# Patient Record
Sex: Female | Born: 1940 | Race: White | Hispanic: No | Smoking: Former smoker
Health system: Southern US, Community
[De-identification: ages and names within clinical notes are randomized; demographics above are authoritative.]

## PROBLEM LIST (undated history)

## (undated) DIAGNOSIS — R001 Bradycardia, unspecified: Secondary | ICD-10-CM

## (undated) DIAGNOSIS — K635 Polyp of colon: Secondary | ICD-10-CM

## (undated) DIAGNOSIS — Z720 Tobacco use: Secondary | ICD-10-CM

## (undated) DIAGNOSIS — I10 Essential (primary) hypertension: Secondary | ICD-10-CM

## (undated) DIAGNOSIS — G259 Extrapyramidal and movement disorder, unspecified: Secondary | ICD-10-CM

## (undated) DIAGNOSIS — J302 Other seasonal allergic rhinitis: Secondary | ICD-10-CM

## (undated) DIAGNOSIS — R251 Tremor, unspecified: Secondary | ICD-10-CM

## (undated) DIAGNOSIS — G629 Polyneuropathy, unspecified: Secondary | ICD-10-CM

## (undated) DIAGNOSIS — A419 Sepsis, unspecified organism: Secondary | ICD-10-CM

## (undated) DIAGNOSIS — N200 Calculus of kidney: Secondary | ICD-10-CM

## (undated) DIAGNOSIS — Z8489 Family history of other specified conditions: Secondary | ICD-10-CM

## (undated) DIAGNOSIS — M810 Age-related osteoporosis without current pathological fracture: Secondary | ICD-10-CM

## (undated) DIAGNOSIS — E7800 Pure hypercholesterolemia, unspecified: Secondary | ICD-10-CM

## (undated) DIAGNOSIS — K219 Gastro-esophageal reflux disease without esophagitis: Secondary | ICD-10-CM

## (undated) HISTORY — PX: COLONOSCOPY: SHX174

## (undated) HISTORY — PX: MOUTH SURGERY: SHX715

## (undated) HISTORY — PX: TONSILLECTOMY: SUR1361

## (undated) HISTORY — PX: OTHER SURGICAL HISTORY: SHX169

## (undated) HISTORY — DX: Polyneuropathy, unspecified: G62.9

## (undated) HISTORY — DX: Other seasonal allergic rhinitis: J30.2

## (undated) HISTORY — PX: APPENDECTOMY: SHX54

## (undated) HISTORY — DX: Tobacco use: Z72.0

## (undated) HISTORY — DX: Pure hypercholesterolemia, unspecified: E78.00

## (undated) HISTORY — DX: Bradycardia, unspecified: R00.1

## (undated) HISTORY — DX: Sepsis, unspecified organism: A41.9

## (undated) HISTORY — DX: Age-related osteoporosis without current pathological fracture: M81.0

## (undated) HISTORY — PX: ABDOMINAL HYSTERECTOMY: SHX81

## (undated) HISTORY — DX: Extrapyramidal and movement disorder, unspecified: G25.9

## (undated) HISTORY — DX: Gastro-esophageal reflux disease without esophagitis: K21.9

## (undated) HISTORY — DX: Polyp of colon: K63.5

---

## 1996-02-24 HISTORY — PX: CHOLECYSTECTOMY: SHX55

## 1998-05-14 ENCOUNTER — Ambulatory Visit (HOSPITAL_BASED_OUTPATIENT_CLINIC_OR_DEPARTMENT_OTHER): Admission: RE | Admit: 1998-05-14 | Discharge: 1998-05-14 | Payer: Self-pay | Admitting: Orthopedic Surgery

## 1998-12-10 ENCOUNTER — Encounter: Payer: Self-pay | Admitting: Obstetrics and Gynecology

## 1998-12-10 ENCOUNTER — Encounter: Admission: RE | Admit: 1998-12-10 | Discharge: 1998-12-10 | Payer: Self-pay | Admitting: Obstetrics and Gynecology

## 1999-01-22 ENCOUNTER — Other Ambulatory Visit: Admission: RE | Admit: 1999-01-22 | Discharge: 1999-01-22 | Payer: Self-pay | Admitting: Obstetrics and Gynecology

## 1999-02-27 ENCOUNTER — Ambulatory Visit (HOSPITAL_COMMUNITY): Admission: RE | Admit: 1999-02-27 | Discharge: 1999-02-27 | Payer: Self-pay | Admitting: Obstetrics and Gynecology

## 1999-12-18 ENCOUNTER — Encounter: Payer: Self-pay | Admitting: Obstetrics and Gynecology

## 1999-12-18 ENCOUNTER — Encounter: Admission: RE | Admit: 1999-12-18 | Discharge: 1999-12-18 | Payer: Self-pay | Admitting: Obstetrics and Gynecology

## 2000-06-11 ENCOUNTER — Other Ambulatory Visit: Admission: RE | Admit: 2000-06-11 | Discharge: 2000-06-11 | Payer: Self-pay | Admitting: Obstetrics and Gynecology

## 2000-06-18 ENCOUNTER — Encounter: Payer: Self-pay | Admitting: Obstetrics and Gynecology

## 2000-06-18 ENCOUNTER — Ambulatory Visit (HOSPITAL_COMMUNITY): Admission: RE | Admit: 2000-06-18 | Discharge: 2000-06-18 | Payer: Self-pay | Admitting: Obstetrics and Gynecology

## 2000-08-16 ENCOUNTER — Ambulatory Visit (HOSPITAL_COMMUNITY): Admission: RE | Admit: 2000-08-16 | Discharge: 2000-08-16 | Payer: Self-pay | Admitting: Obstetrics and Gynecology

## 2000-08-16 ENCOUNTER — Encounter: Payer: Self-pay | Admitting: Obstetrics and Gynecology

## 2000-12-21 ENCOUNTER — Encounter: Payer: Self-pay | Admitting: Obstetrics and Gynecology

## 2000-12-21 ENCOUNTER — Encounter: Admission: RE | Admit: 2000-12-21 | Discharge: 2000-12-21 | Payer: Self-pay | Admitting: Obstetrics and Gynecology

## 2001-01-04 ENCOUNTER — Encounter: Payer: Self-pay | Admitting: Internal Medicine

## 2001-01-04 ENCOUNTER — Encounter: Admission: RE | Admit: 2001-01-04 | Discharge: 2001-01-04 | Payer: Self-pay | Admitting: Internal Medicine

## 2001-02-02 ENCOUNTER — Encounter: Payer: Self-pay | Admitting: Surgery

## 2001-02-07 ENCOUNTER — Ambulatory Visit (HOSPITAL_COMMUNITY): Admission: RE | Admit: 2001-02-07 | Discharge: 2001-02-08 | Payer: Self-pay | Admitting: Surgery

## 2001-02-07 ENCOUNTER — Encounter (INDEPENDENT_AMBULATORY_CARE_PROVIDER_SITE_OTHER): Payer: Self-pay | Admitting: *Deleted

## 2001-02-07 ENCOUNTER — Encounter: Payer: Self-pay | Admitting: Surgery

## 2001-07-15 ENCOUNTER — Other Ambulatory Visit: Admission: RE | Admit: 2001-07-15 | Discharge: 2001-07-15 | Payer: Self-pay | Admitting: Obstetrics and Gynecology

## 2001-12-23 ENCOUNTER — Encounter: Payer: Self-pay | Admitting: Obstetrics and Gynecology

## 2001-12-23 ENCOUNTER — Encounter: Admission: RE | Admit: 2001-12-23 | Discharge: 2001-12-23 | Payer: Self-pay | Admitting: Obstetrics and Gynecology

## 2002-12-19 ENCOUNTER — Ambulatory Visit (HOSPITAL_COMMUNITY): Admission: RE | Admit: 2002-12-19 | Discharge: 2002-12-19 | Payer: Self-pay | Admitting: Gastroenterology

## 2002-12-19 ENCOUNTER — Encounter (INDEPENDENT_AMBULATORY_CARE_PROVIDER_SITE_OTHER): Payer: Self-pay | Admitting: *Deleted

## 2003-01-03 ENCOUNTER — Encounter: Admission: RE | Admit: 2003-01-03 | Discharge: 2003-01-03 | Payer: Self-pay | Admitting: Obstetrics and Gynecology

## 2004-01-23 ENCOUNTER — Encounter: Admission: RE | Admit: 2004-01-23 | Discharge: 2004-01-23 | Payer: Self-pay | Admitting: Obstetrics and Gynecology

## 2005-01-28 ENCOUNTER — Encounter: Admission: RE | Admit: 2005-01-28 | Discharge: 2005-01-28 | Payer: Self-pay | Admitting: Obstetrics and Gynecology

## 2006-02-18 ENCOUNTER — Encounter: Admission: RE | Admit: 2006-02-18 | Discharge: 2006-02-18 | Payer: Self-pay | Admitting: Obstetrics and Gynecology

## 2007-02-18 ENCOUNTER — Emergency Department (HOSPITAL_COMMUNITY): Admission: EM | Admit: 2007-02-18 | Discharge: 2007-02-18 | Payer: Self-pay | Admitting: *Deleted

## 2007-02-21 ENCOUNTER — Encounter: Admission: RE | Admit: 2007-02-21 | Discharge: 2007-02-21 | Payer: Self-pay | Admitting: Obstetrics and Gynecology

## 2008-02-22 ENCOUNTER — Encounter: Admission: RE | Admit: 2008-02-22 | Discharge: 2008-02-22 | Payer: Self-pay | Admitting: Obstetrics and Gynecology

## 2009-03-13 ENCOUNTER — Encounter: Admission: RE | Admit: 2009-03-13 | Discharge: 2009-03-13 | Payer: Self-pay | Admitting: Obstetrics and Gynecology

## 2010-02-06 ENCOUNTER — Emergency Department (HOSPITAL_COMMUNITY)
Admission: EM | Admit: 2010-02-06 | Discharge: 2010-02-07 | Payer: Self-pay | Source: Home / Self Care | Admitting: Emergency Medicine

## 2010-03-14 ENCOUNTER — Encounter
Admission: RE | Admit: 2010-03-14 | Discharge: 2010-03-14 | Payer: Self-pay | Source: Home / Self Care | Attending: Obstetrics and Gynecology | Admitting: Obstetrics and Gynecology

## 2010-03-16 ENCOUNTER — Encounter: Payer: Self-pay | Admitting: Obstetrics and Gynecology

## 2010-07-11 NOTE — Op Note (Signed)
[place]. [facility]  Patient:    Rebecca Flores, Rebecca Flores Visit Number: [id number] MRN: [id number]          Service Type: DSU Location: [facility] [id number] Attending Physician:  Bonnetta Barry Dictated by:   Velora Heckler, M.D. Proc. Date: 02/07/01 Admit Date:  02/07/2001   CC:         Pearla Dubonnet, M.D.  Malachi Pro. Ambrose Mantle, M.D.   Operative Report  PREOPERATIVE DIAGNOSIS:  Chronic cholecystitis, cholelithiasis.  POSTOPERATIVE DIAGNOSIS:  Chronic cholecystitis, cholelithiasis.  PROCEDURE:  Laparoscopic cholecystectomy with intraoperative cholangiography.  SURGEON:  Velora Heckler, M.D.  ASSISTANT:  Vikki Ports, M.D.  ANESTHESIA:  General.  ESTIMATED BLOOD LOSS:  Minimal.  PREPARATION:  Betadine.  COMPLICATIONS:  None.  INDICATIONS:  The patient is a 70 year old white female who presents at the request of Dr. Robley Fries with a 10-year history of cholelithiasis.  The patient experiences intermittent epigastric abdominal pain radiated to the back, associated with nausea.  Episodes last from 30-45 minutes.  Patient has had escalating frequency in her attacks and now averages approximately two episodes per week.  Ultrasound at [facility] on January 04, 2001, showed numerous shadowing gallstones within the gallbladder.  The patient now comes to surgery for cholecystectomy.  DESCRIPTION OF PROCEDURE:  The patient was taken to OR #[id number] at the [facility].  The patient was brought to the operating room and placed in a supine position on the operating room table.  Following administration of general anesthesia, the patient was prepped and draped in the usual strict aseptic fashion.  After ascertaining that an adequate level of anesthesia had been obtained, an infraumbilical incision is made in the midline with a #15 blade.  Dissection is carried down through the subcutaneous tissues.  Fascia is incised in the midline.  The  peritoneal cavity is entered cautiously.  An 0 Vicryl pursestring suture is placed in the fascia.  A Hasson cannula was introduced under direct vision and secured with the pursestring suture.  The abdomen is insufflated with carbon dioxide.  Laparoscope is introduced and the abdomen is explored.  There are adhesions to the patients lower midline surgical wound.  Operating ports are placed in the right upper quadrant, the midline, midclavicular line, and the anterior axillary line. The fundus of the gallbladder is grasped and retracted cephalad.  There are dense omental adhesions to the entire undersurface of the gallbladder.  These are carefully dissected off the gallbladder using the electrocautery for hemostasis.  Dissection is carried down to the neck of the gallbladder, taking care to avoid injury to the duodenum.  The gallbladder is mobilized.  A lymph node is mobilized off of the cystic duct.  The cystic artery was identified, doubly clipped, and divided.  The cystic duct is dissected out along its length.  It is relatively generous in nature.  A clip is placed at the neck of the gallbladder.  The cystic duct is incised, and a Cook cholangiography catheter is introduced into the right upper quadrant and advanced into the cystic duct.  This is secured with a ligaclip.  Using real-time C-arm fluoroscopy, cholangiography is performed.  Hypaque is injected under direct vision with the C-arm.  There is an elongated cystic duct.  There is rapid filling of the main common bile duct and free flow into the duodenum without filling defect or obstruction.  There is slight reflux of contrast into the pancreatic duct.  There is reflux  of contrast into both the right and left ductal systems.  The [facility] catheter is removed, and the clip is extracted from the abdomen.  The posterior branch of the cystic artery is identified, dissected out, doubly clipped, divided.  The cystic duct is then clipped  with four ligaclips and the cystic duct is divided.  The gallbladder is then excised from the gallbladder bed using the hook electrocautery for hemostasis. The gallbladder is extracted using the umbilical port without difficulty.  It contains numerous gallstones.  The 0 Vicryl pursestring suture is tied securely.  Right upper quadrant is irrigated copiously with warm saline, which is evacuated.  Ports are removed under direct vision, and good hemostasis is noted.  Pneumoperitoneum is released.  All port sites are anesthetized with local anesthesia.  All port sites are closed with interrupted 4-0 Vicryl subcuticular sutures.  Benzoin and Steri-Strips are applied.  Sterile gauze dressings are applied.  The patient is awakened from anesthesia and brought to the recovery room in stable condition.  The patient tolerated the procedure well. Dictated by:   Velora Heckler, M.D. Attending Physician:  Bonnetta Barry DD:  02/07/01 TD:  02/07/01 Job: [id number]

## 2010-11-28 LAB — POCT I-STAT CREATININE
Creatinine, Ser: 0.9
Operator id: 294501

## 2010-11-28 LAB — I-STAT 8, (EC8 V) (CONVERTED LAB)
BUN: 19
Bicarbonate: 24.2 — ABNORMAL HIGH
Chloride: 109
Glucose, Bld: 117 — ABNORMAL HIGH
HCT: 40
Hemoglobin: 13.6
Operator id: 294501
Potassium: 4.8
Sodium: 138
TCO2: 25
pCO2, Ven: 37.6 — ABNORMAL LOW
pH, Ven: 7.416 — ABNORMAL HIGH

## 2010-12-13 ENCOUNTER — Emergency Department (HOSPITAL_COMMUNITY)
Admission: EM | Admit: 2010-12-13 | Discharge: 2010-12-13 | Disposition: A | Discharge status: Home / Self Care | Payer: Medicare Other | Attending: Emergency Medicine | Admitting: Emergency Medicine

## 2010-12-13 ENCOUNTER — Emergency Department (HOSPITAL_COMMUNITY): Payer: Medicare Other

## 2010-12-13 DIAGNOSIS — N1330 Unspecified hydronephrosis: Secondary | ICD-10-CM | POA: Insufficient documentation

## 2010-12-13 DIAGNOSIS — E119 Type 2 diabetes mellitus without complications: Secondary | ICD-10-CM | POA: Insufficient documentation

## 2010-12-13 DIAGNOSIS — Z79899 Other long term (current) drug therapy: Secondary | ICD-10-CM | POA: Insufficient documentation

## 2010-12-13 DIAGNOSIS — N201 Calculus of ureter: Secondary | ICD-10-CM | POA: Insufficient documentation

## 2010-12-13 DIAGNOSIS — K219 Gastro-esophageal reflux disease without esophagitis: Secondary | ICD-10-CM | POA: Insufficient documentation

## 2010-12-13 LAB — CBC
HCT: 37.2 % (ref 36.0–46.0)
Hemoglobin: 12.8 g/dL (ref 12.0–15.0)
MCH: 30.2 pg (ref 26.0–34.0)
MCHC: 34.4 g/dL (ref 30.0–36.0)
MCV: 87.7 fL (ref 78.0–100.0)
Platelets: 194 10*3/uL (ref 150–400)
RBC: 4.24 MIL/uL (ref 3.87–5.11)
RDW: 12.5 % (ref 11.5–15.5)
WBC: 9.9 10*3/uL (ref 4.0–10.5)

## 2010-12-13 LAB — DIFFERENTIAL
Basophils Absolute: 0.1 10*3/uL (ref 0.0–0.1)
Basophils Relative: 1 % (ref 0–1)
Eosinophils Absolute: 0.2 10*3/uL (ref 0.0–0.7)
Eosinophils Relative: 2 % (ref 0–5)
Lymphocytes Relative: 26 % (ref 12–46)
Lymphs Abs: 2.6 10*3/uL (ref 0.7–4.0)
Monocytes Absolute: 0.5 10*3/uL (ref 0.1–1.0)
Monocytes Relative: 5 % (ref 3–12)
Neutro Abs: 6.5 10*3/uL (ref 1.7–7.7)
Neutrophils Relative %: 66 % (ref 43–77)

## 2010-12-13 LAB — COMPREHENSIVE METABOLIC PANEL
ALT: 33 U/L (ref 0–35)
AST: 56 U/L — ABNORMAL HIGH (ref 0–37)
Albumin: 3.8 g/dL (ref 3.5–5.2)
Alkaline Phosphatase: 77 U/L (ref 39–117)
BUN: 16 mg/dL (ref 6–23)
CO2: 20 mEq/L (ref 19–32)
Calcium: 9.2 mg/dL (ref 8.4–10.5)
Chloride: 105 mEq/L (ref 96–112)
Creatinine, Ser: 0.7 mg/dL (ref 0.50–1.10)
GFR calc Af Amer: >90 mL/min (ref >90)
GFR calc non Af Amer: 85 mL/min — ABNORMAL LOW (ref >90)
Glucose, Bld: 156 mg/dL — ABNORMAL HIGH (ref 70–99)
Potassium: 3.6 mEq/L (ref 3.5–5.1)
Sodium: 138 mEq/L (ref 135–145)
Total Bilirubin: 0.8 mg/dL (ref 0.3–1.2)
Total Protein: 6.3 g/dL (ref 6.0–8.3)

## 2010-12-13 LAB — URINE MICROSCOPIC-ADD ON

## 2010-12-13 LAB — URINALYSIS, ROUTINE W REFLEX MICROSCOPIC
Bilirubin Urine: NEGATIVE
Glucose, UA: NEGATIVE mg/dL
Ketones, ur: 15 mg/dL — AB
Nitrite: NEGATIVE
Protein, ur: NEGATIVE mg/dL
Specific Gravity, Urine: 1.018 (ref 1.005–1.030)
Urobilinogen, UA: 0.2 mg/dL (ref 0.0–1.0)
pH: 5 (ref 5.0–8.0)

## 2010-12-14 LAB — URINE CULTURE
Colony Count: NO GROWTH
Culture  Setup Time: 201210201747
Culture: NO GROWTH

## 2011-02-09 ENCOUNTER — Other Ambulatory Visit: Payer: Self-pay | Admitting: Obstetrics and Gynecology

## 2011-02-09 DIAGNOSIS — Z1231 Encounter for screening mammogram for malignant neoplasm of breast: Secondary | ICD-10-CM

## 2011-03-16 ENCOUNTER — Ambulatory Visit
Admission: RE | Admit: 2011-03-16 | Discharge: 2011-03-16 | Disposition: A | Discharge status: Home / Self Care | Payer: Medicare Other | Source: Ambulatory Visit | Attending: Obstetrics and Gynecology | Admitting: Obstetrics and Gynecology

## 2011-03-16 DIAGNOSIS — Z1231 Encounter for screening mammogram for malignant neoplasm of breast: Secondary | ICD-10-CM

## 2011-04-24 ENCOUNTER — Emergency Department (HOSPITAL_COMMUNITY): Payer: Medicare Other

## 2011-04-24 ENCOUNTER — Encounter (HOSPITAL_COMMUNITY): Payer: Self-pay | Admitting: Emergency Medicine

## 2011-04-24 ENCOUNTER — Inpatient Hospital Stay (HOSPITAL_COMMUNITY)
Admission: EM | Admit: 2011-04-24 | Discharge: 2011-04-27 | DRG: 690 | Disposition: A | Discharge status: Home / Self Care | Payer: Medicare Other | Attending: Internal Medicine | Admitting: Internal Medicine

## 2011-04-24 ENCOUNTER — Other Ambulatory Visit: Payer: Self-pay

## 2011-04-24 DIAGNOSIS — J449 Chronic obstructive pulmonary disease, unspecified: Secondary | ICD-10-CM | POA: Diagnosis present

## 2011-04-24 DIAGNOSIS — E119 Type 2 diabetes mellitus without complications: Secondary | ICD-10-CM | POA: Diagnosis present

## 2011-04-24 DIAGNOSIS — Y92009 Unspecified place in unspecified non-institutional (private) residence as the place of occurrence of the external cause: Secondary | ICD-10-CM

## 2011-04-24 DIAGNOSIS — R112 Nausea with vomiting, unspecified: Secondary | ICD-10-CM | POA: Diagnosis present

## 2011-04-24 DIAGNOSIS — R748 Abnormal levels of other serum enzymes: Secondary | ICD-10-CM | POA: Diagnosis present

## 2011-04-24 DIAGNOSIS — R55 Syncope and collapse: Secondary | ICD-10-CM

## 2011-04-24 DIAGNOSIS — R1115 Cyclical vomiting syndrome unrelated to migraine: Secondary | ICD-10-CM

## 2011-04-24 DIAGNOSIS — E781 Pure hyperglyceridemia: Secondary | ICD-10-CM | POA: Diagnosis present

## 2011-04-24 DIAGNOSIS — Z87891 Personal history of nicotine dependence: Secondary | ICD-10-CM

## 2011-04-24 DIAGNOSIS — N390 Urinary tract infection, site not specified: Principal | ICD-10-CM

## 2011-04-24 DIAGNOSIS — J4489 Other specified chronic obstructive pulmonary disease: Secondary | ICD-10-CM | POA: Diagnosis present

## 2011-04-24 DIAGNOSIS — W19XXXA Unspecified fall, initial encounter: Secondary | ICD-10-CM | POA: Diagnosis present

## 2011-04-24 DIAGNOSIS — I10 Essential (primary) hypertension: Secondary | ICD-10-CM | POA: Diagnosis present

## 2011-04-24 DIAGNOSIS — S0100XA Unspecified open wound of scalp, initial encounter: Secondary | ICD-10-CM | POA: Diagnosis present

## 2011-04-24 DIAGNOSIS — R109 Unspecified abdominal pain: Secondary | ICD-10-CM | POA: Diagnosis present

## 2011-04-24 DIAGNOSIS — R259 Unspecified abnormal involuntary movements: Secondary | ICD-10-CM | POA: Diagnosis present

## 2011-04-24 DIAGNOSIS — Z87442 Personal history of urinary calculi: Secondary | ICD-10-CM

## 2011-04-24 HISTORY — DX: Calculus of kidney: N20.0

## 2011-04-24 HISTORY — DX: Essential (primary) hypertension: I10

## 2011-04-24 HISTORY — DX: Tremor, unspecified: R25.1

## 2011-04-24 LAB — CBC
Hemoglobin: 12.3 g/dL (ref 12.0–15.0)
MCH: 29.5 pg (ref 26.0–34.0)
Platelets: 219 10*3/uL (ref 150–400)
RBC: 4.25 MIL/uL (ref 3.87–5.11)
RBC: 4.3 MIL/uL (ref 3.87–5.11)
RDW: 12.4 % (ref 11.5–15.5)

## 2011-04-24 LAB — URINALYSIS, ROUTINE W REFLEX MICROSCOPIC
Bilirubin Urine: NEGATIVE
Hgb urine dipstick: NEGATIVE
Ketones, ur: NEGATIVE mg/dL
Nitrite: POSITIVE — AB
Urobilinogen, UA: 0.2 mg/dL (ref 0.0–1.0)
pH: 5.5 (ref 5.0–8.0)

## 2011-04-24 LAB — COMPREHENSIVE METABOLIC PANEL
Alkaline Phosphatase: 78 U/L (ref 39–117)
BUN: 19 mg/dL (ref 6–23)
Chloride: 105 mEq/L (ref 96–112)
GFR calc Af Amer: >90 mL/min (ref >90)
Glucose, Bld: 174 mg/dL — ABNORMAL HIGH (ref 70–99)
Potassium: 4 mEq/L (ref 3.5–5.1)
Total Bilirubin: 0.6 mg/dL (ref 0.3–1.2)

## 2011-04-24 LAB — DIFFERENTIAL
Lymphocytes Relative: 8 % — ABNORMAL LOW (ref 12–46)
Lymphs Abs: 1.1 10*3/uL (ref 0.7–4.0)
Monocytes Relative: 6 % (ref 3–12)
Neutro Abs: 10.7 10*3/uL — ABNORMAL HIGH (ref 1.7–7.7)
Neutrophils Relative %: 84 % — ABNORMAL HIGH (ref 43–77)

## 2011-04-24 LAB — CARDIAC PANEL(CRET KIN+CKTOT+MB+TROPI)
CK, MB: 2.3 ng/mL (ref 0.3–4.0)
Relative Index: INVALID (ref 0.0–2.5)
Troponin I: <0.3 ng/mL (ref <0.30)
Troponin I: <0.3 ng/mL (ref <0.30)

## 2011-04-24 LAB — GLUCOSE, CAPILLARY: Glucose-Capillary: 156 mg/dL — ABNORMAL HIGH (ref 70–99)

## 2011-04-24 LAB — HEMOGLOBIN A1C: Hgb A1c MFr Bld: 6.8 % — ABNORMAL HIGH (ref <5.7)

## 2011-04-24 LAB — URINE MICROSCOPIC-ADD ON

## 2011-04-24 LAB — PHOSPHORUS: Phosphorus: 3 mg/dL (ref 2.3–4.6)

## 2011-04-24 LAB — LIPASE, BLOOD: Lipase: 110 U/L — ABNORMAL HIGH (ref 11–59)

## 2011-04-24 MED ORDER — ONDANSETRON HCL 4 MG PO TABS: 4.0000 mg | ORAL_TABLET | Freq: Four times a day (QID) | ORAL | Status: DC | PRN

## 2011-04-24 MED ORDER — PRIMIDONE 50 MG PO TABS
50.0000 mg | ORAL_TABLET | Freq: Two times a day (BID) | ORAL | Status: DC
  Administered 2011-04-24 – 2011-04-27 (×6): 50 mg via ORAL
  Filled 2011-04-24 (×7): qty 1

## 2011-04-24 MED ORDER — ACETAMINOPHEN 650 MG RE SUPP: 650.0000 mg | Freq: Four times a day (QID) | RECTAL | Status: DC | PRN

## 2011-04-24 MED ORDER — ONDANSETRON HCL 4 MG/2ML IJ SOLN
4.0000 mg | Freq: Four times a day (QID) | INTRAMUSCULAR | Status: DC | PRN
  Administered 2011-04-24 (×2): 4 mg via INTRAVENOUS
  Filled 2011-04-24 (×2): qty 2

## 2011-04-24 MED ORDER — DEXTROSE 5 % IV SOLN
1.0000 g | Freq: Once | INTRAVENOUS | Status: AC
  Administered 2011-04-24: 1 g via INTRAVENOUS
  Filled 2011-04-24: qty 10

## 2011-04-24 MED ORDER — ONDANSETRON HCL 4 MG/2ML IJ SOLN: 4.0000 mg | Freq: Three times a day (TID) | INTRAMUSCULAR | Status: DC | PRN

## 2011-04-24 MED ORDER — ASPIRIN 81 MG PO CHEW: 81.0000 mg | CHEWABLE_TABLET | ORAL | Status: DC

## 2011-04-24 MED ORDER — ACETAMINOPHEN 325 MG PO TABS
650.0000 mg | ORAL_TABLET | Freq: Four times a day (QID) | ORAL | Status: DC | PRN
  Administered 2011-04-24 – 2011-04-25 (×4): 650 mg via ORAL
  Filled 2011-04-24 (×4): qty 2

## 2011-04-24 MED ORDER — SODIUM CHLORIDE 0.9 % IV SOLN
INTRAVENOUS | Status: DC
  Administered 2011-04-24 – 2011-04-26 (×4): via INTRAVENOUS

## 2011-04-24 MED ORDER — LORAZEPAM 2 MG/ML IJ SOLN
1.0000 mg | Freq: Once | INTRAMUSCULAR | Status: AC
Start: 2011-04-24 — End: 2011-04-24
  Administered 2011-04-24: 1 mg via INTRAVENOUS
  Filled 2011-04-24: qty 1

## 2011-04-24 MED ORDER — SODIUM CHLORIDE 0.9 % IV SOLN: INTRAVENOUS | Status: DC

## 2011-04-24 MED ORDER — OMEGA-3-ACID ETHYL ESTERS 1 G PO CAPS
1.0000 g | ORAL_CAPSULE | Freq: Every day | ORAL | Status: DC
  Administered 2011-04-25 – 2011-04-27 (×3): 1 g via ORAL
  Filled 2011-04-24 (×4): qty 1

## 2011-04-24 MED ORDER — ONDANSETRON HCL 4 MG/2ML IJ SOLN
4.0000 mg | Freq: Once | INTRAMUSCULAR | Status: AC
  Administered 2011-04-24: 4 mg via INTRAVENOUS

## 2011-04-24 MED ORDER — INSULIN ASPART 100 UNIT/ML ~~LOC~~ SOLN
0.0000 [IU] | Freq: Three times a day (TID) | SUBCUTANEOUS | Status: DC
  Administered 2011-04-25 – 2011-04-27 (×4): 1 [IU] via SUBCUTANEOUS

## 2011-04-24 MED ORDER — SODIUM CHLORIDE 0.9 % IV BOLUS (SEPSIS)
1000.0000 mL | Freq: Once | INTRAVENOUS | Status: AC
  Administered 2011-04-24: 1000 mL via INTRAVENOUS

## 2011-04-24 MED ORDER — PANTOPRAZOLE SODIUM 40 MG PO TBEC
40.0000 mg | DELAYED_RELEASE_TABLET | Freq: Two times a day (BID) | ORAL | Status: DC
  Administered 2011-04-25 – 2011-04-27 (×5): 40 mg via ORAL
  Filled 2011-04-24 (×6): qty 1

## 2011-04-24 MED ORDER — ONDANSETRON HCL 4 MG/2ML IJ SOLN
INTRAMUSCULAR | Status: AC
  Filled 2011-04-24: qty 2

## 2011-04-24 MED ORDER — OMEGA-3 FATTY ACIDS 1000 MG PO CAPS: 1.0000 g | ORAL_CAPSULE | Freq: Every day | ORAL | Status: DC

## 2011-04-24 MED ORDER — ENOXAPARIN SODIUM 40 MG/0.4ML ~~LOC~~ SOLN
40.0000 mg | SUBCUTANEOUS | Status: DC
  Administered 2011-04-24: 40 mg via SUBCUTANEOUS
  Filled 2011-04-24 (×2): qty 0.4

## 2011-04-24 MED ORDER — INSULIN ASPART 100 UNIT/ML ~~LOC~~ SOLN
0.0000 [IU] | Freq: Every day | SUBCUTANEOUS | Status: DC
  Filled 2011-04-24: qty 3

## 2011-04-24 MED ORDER — ONDANSETRON HCL 4 MG/2ML IJ SOLN
INTRAMUSCULAR | Status: AC
  Administered 2011-04-24: 4 mg via INTRAVENOUS
  Filled 2011-04-24: qty 2

## 2011-04-24 MED ORDER — MORPHINE SULFATE 2 MG/ML IJ SOLN: 1.0000 mg | INTRAMUSCULAR | Status: DC | PRN

## 2011-04-24 MED ORDER — TETANUS-DIPHTHERIA TOXOIDS TD 5-2 LFU IM INJ
0.5000 mL | INJECTION | Freq: Once | INTRAMUSCULAR | Status: AC
  Administered 2011-04-24: 0.5 mL via INTRAMUSCULAR
  Filled 2011-04-24: qty 0.5

## 2011-04-24 NOTE — H&P (Signed)
PCP:   Dr. Marden Noble   Chief Complaint:  N/V, abdominal pain, LOC, head laceration  HPI: 71 year old female with a past medical history significant for hypertension (currently not taking any medications), diabetes, central tremors; came the hospital after experiencing lost consciousness at home and falling. Patient reports that she woke up in the middle of the night in order to go to the bathroom last thing that she remembers she was actually standing up from the floor not really knowing what happen. Patient endorses a for the last 2 days she has not been eating and drinking properly secondary to abdominal pain with associated nausea/vomiting. Patient also endorses some diarrhea. Of note she has been using red yeast rice over-the-counter for the last week prior to admission. Patient denies any fever/chills, no hematemesis, no hematochezia, no shortness of breath or chest pain. In the ED a CT of the head was essentially without any acute abnormalities demonstrated just on the scalp laceration sutured by ED physician. But her lab work demonstrated leukocytosis (WBCs in the 12.8 range); urinalysis suggesting UTI, and also elevated lipase.   [facility] was called to admit the patient on behalf of Dr. Kevan Ny do to the patient lives with consciousness, elevated lipase and UTI.  Allergies:  No Known Allergies    Past Medical History  Diagnosis Date  . Diabetes mellitus   . Tremors of nervous system   . Kidney stone   . Hypertension     Past Surgical History  Procedure Date  . Cholecystectomy   . Abdominal hysterectomy     Prior to Admission medications   Medication Sig Start Date End Date Taking? Authorizing Provider  aspirin 81 MG chewable tablet Chew 81 mg by mouth every other day.   Yes Historical Provider, MD  Cinnamon 500 MG TABS Take 1 tablet by mouth daily.   Yes Historical Provider, MD  fish oil-omega-3 fatty acids 1000 MG capsule Take 1 g by mouth daily.   Yes Historical  Provider, MD  metFORMIN (GLUCOPHAGE) 500 MG tablet Take 500 mg by mouth 2 (two) times daily with a meal.   Yes Historical Provider, MD  nystatin-triamcinolone (MYCOLOG II) cream Apply 1 application topically 2 (two) times daily as needed.   Yes Historical Provider, MD  primidone (MYSOLINE) 50 MG tablet Take 25-50 mg by mouth 2 (two) times daily.   Yes Historical Provider, MD    Social History:  reports that she quit smoking about 2 years ago. Her smoking use included Cigarettes. She has never used smokeless tobacco. She reports that she drinks about .6 ounces of alcohol per week. She reports that she does not use illicit drugs.  History reviewed. No pertinent family history.  Review of Systems:  Positive for leg cramps (especially at night); also some black stools. Otherwise negative except as mentioned on history of present illness.  Physical Exam: Blood pressure 148/73, pulse 76, temperature 98.1 F (36.7 C), temperature source Oral, resp. rate 20, height 5\' 5"  (1.651 m), weight 67.4 kg (148 lb 9.4 oz), SpO2 94.00%. General: No acute distress, lying on bed, cooperative to examination. Patient was alert, awake and oriented x3 Head: normocephalic, left post area with sutured laceration and some dry blood; tender to palpation. Eyes: PERRLA,EOMI; no icterus or nystagmus. Nose: no discharges; patent nostrils. Oropharynx: mild dry mucous membranes, no erythema or exudates. Neck: Supple, no bruits, no thyromegaly. Heart: RRR, no murmurs, no rubs. Resp: Clear to auscultation bilaterally Abdomen: Soft, tender to palpation mid epigastric area, no  rebound, no distention, positive bowel sounds. Extremeties: No edema cyanosis or clubbing. Patient reported pain to palpation of her legs. Neurologic exam: Alert, awake and oriented x3; cranial nerve 2-12 grossly intact, able to follow commands properly, muscle strength 4-5/5 bilaterally and symmetrically due to poor effort. No focal deficit. Psych:  appropriate.  Labs on Admission:  Results for orders placed during the hospital encounter of 04/24/11 (from the past 48 hour(s))  URINALYSIS, ROUTINE W REFLEX MICROSCOPIC     Status: Abnormal   Collection Time   04/24/11  9:34 AM      Component Value Range Comment   Color, Urine YELLOW  YELLOW     APPearance CLOUDY (*) CLEAR     Specific Gravity, Urine 1.018  1.005 - 1.030     pH 5.5  5.0 - 8.0     Glucose, UA NEGATIVE  NEGATIVE (mg/dL)    Hgb urine dipstick NEGATIVE  NEGATIVE     Bilirubin Urine NEGATIVE  NEGATIVE     Ketones, ur NEGATIVE  NEGATIVE (mg/dL)    Protein, ur NEGATIVE  NEGATIVE (mg/dL)    Urobilinogen, UA 0.2  0.0 - 1.0 (mg/dL)    Nitrite POSITIVE (*) NEGATIVE     Leukocytes, UA SMALL (*) NEGATIVE    URINE MICROSCOPIC-ADD ON     Status: Abnormal   Collection Time   04/24/11  9:34 AM      Component Value Range Comment   Squamous Epithelial / LPF FEW (*) RARE     WBC, UA 11-20  <3 (WBC/hpf) WITH CLUMPS   Bacteria, UA MANY (*) RARE     Urine-Other MUCOUS PRESENT     CBC     Status: Abnormal   Collection Time   04/24/11  9:46 AM      Component Value Range Comment   WBC 12.8 (*) 4.0 - 10.5 (K/uL)    RBC 4.25  3.87 - 5.11 (MIL/uL)    Hemoglobin 12.3  12.0 - 15.0 (g/dL)    HCT 16.1  09.6 - 04.5 (%)    MCV 87.5  78.0 - 100.0 (fL)    MCH 28.9  26.0 - 34.0 (pg)    MCHC 33.1  30.0 - 36.0 (g/dL)    RDW 40.9  81.1 - 91.4 (%)    Platelets 213  150 - 400 (K/uL)   DIFFERENTIAL     Status: Abnormal   Collection Time   04/24/11  9:46 AM      Component Value Range Comment   Neutrophils Relative 84 (*) 43 - 77 (%)    Neutro Abs 10.7 (*) 1.7 - 7.7 (K/uL)    Lymphocytes Relative 8 (*) 12 - 46 (%)    Lymphs Abs 1.1  0.7 - 4.0 (K/uL)    Monocytes Relative 6  3 - 12 (%)    Monocytes Absolute 0.7  0.1 - 1.0 (K/uL)    Eosinophils Relative 2  0 - 5 (%)    Eosinophils Absolute 0.3  0.0 - 0.7 (K/uL)    Basophils Relative 0  0 - 1 (%)    Basophils Absolute 0.0  0.0 - 0.1 (K/uL)     COMPREHENSIVE METABOLIC PANEL     Status: Abnormal   Collection Time   04/24/11  9:46 AM      Component Value Range Comment   Sodium 138  135 - 145 (mEq/L)    Potassium 4.0  3.5 - 5.1 (mEq/L)    Chloride 105  96 - 112 (mEq/L)  CO2 21  19 - 32 (mEq/L)    Glucose, Bld 174 (*) 70 - 99 (mg/dL)    BUN 19  6 - 23 (mg/dL)    Creatinine, Ser 1.61  0.50 - 1.10 (mg/dL)    Calcium 9.2  8.4 - 10.5 (mg/dL)    Total Protein 6.7  6.0 - 8.3 (g/dL)    Albumin 4.0  3.5 - 5.2 (g/dL)    AST 56 (*) 0 - 37 (U/L)    ALT 36 (*) 0 - 35 (U/L)    Alkaline Phosphatase 78  39 - 117 (U/L)    Total Bilirubin 0.6  0.3 - 1.2 (mg/dL)    GFR calc non Af Amer 90 (*) >90 (mL/min)    GFR calc Af Amer >90  >90 (mL/min)   LIPASE, BLOOD     Status: Abnormal   Collection Time   04/24/11  9:46 AM      Component Value Range Comment   Lipase 110 (*) 11 - 59 (U/L)   CARDIAC PANEL(CRET KIN+CKTOT+MB+TROPI)     Status: Normal   Collection Time   04/24/11  9:46 AM      Component Value Range Comment   Total CK 52  7 - 177 (U/L)    CK, MB 2.8  0.3 - 4.0 (ng/mL)    Troponin I <0.30  <0.30 (ng/mL)    Relative Index RELATIVE INDEX IS INVALID  0.0 - 2.5      Radiological Exams on Admission: Dg Chest 2 View  04/24/2011  *RADIOLOGY REPORT*  Clinical Data: Syncope and fall  CHEST - 2 VIEW  Comparison: 02/07/2010  Findings: Mild cardiomegaly.  Clear lungs.  No pneumothorax.  No obvious acute bony deformity.  IMPRESSION: No active cardiopulmonary disease.  Original Report Authenticated By: Donavan Burnet, M.D.   Ct Head Wo Contrast  04/24/2011  *RADIOLOGY REPORT*  Clinical Data:  Fall, head injury.  Posterior and laceration.  CT HEAD WITHOUT CONTRAST CT CERVICAL SPINE WITHOUT CONTRAST  Technique:  Multidetector CT imaging of the head and cervical spine was performed following the standard protocol without intravenous contrast.  Multiplanar CT image reconstructions of the cervical spine were also generated.  Comparison:  02/18/2007  CT HEAD   Findings: Soft tissue swelling over the left posterior scalp.  No underlying calvarial abnormality.  Mild cerebral atrophy. Calcification is noted in the frontal lobes bilaterally in the periventricular white matter, stable since prior study.  No hemorrhage.  No acute infarction or hydrocephalus.  No acute calvarial abnormality.  Acute on chronic sinusitis changes.  Small amount of fluid in the left mastoid air cells.  Right mastoids are clear.  IMPRESSION: No acute intracranial abnormality.  Acute on chronic sinusitis.  Fluid within the left mastoid air cells.  CT CERVICAL SPINE  Findings: Loss of normal cervical lordosis.  Prevertebral soft tissues are normal.  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  No fracture.  No epidural or paraspinal hematoma.  IMPRESSION: Cervical straightening.  Early spondylosis.  No acute findings.  Original Report Authenticated By: Cyndie Chime, M.D.   Ct Cervical Spine Wo Contrast  04/24/2011  *RADIOLOGY REPORT*  Clinical Data:  Fall, head injury.  Posterior and laceration.  CT HEAD WITHOUT CONTRAST CT CERVICAL SPINE WITHOUT CONTRAST  Technique:  Multidetector CT imaging of the head and cervical spine was performed following the standard protocol without intravenous contrast.  Multiplanar CT image reconstructions of the cervical spine were  also generated.  Comparison:  02/18/2007  CT HEAD  Findings: Soft tissue swelling over the left posterior scalp.  No underlying calvarial abnormality.  Mild cerebral atrophy. Calcification is noted in the frontal lobes bilaterally in the periventricular white matter, stable since prior study.  No hemorrhage.  No acute infarction or hydrocephalus.  No acute calvarial abnormality.  Acute on chronic sinusitis changes.  Small amount of fluid in the left mastoid air cells.  Right mastoids are clear.  IMPRESSION: No acute intracranial abnormality.  Acute on chronic sinusitis.  Fluid  within the left mastoid air cells.  CT CERVICAL SPINE  Findings: Loss of normal cervical lordosis.  Prevertebral soft tissues are normal.  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  No fracture.  No epidural or paraspinal hematoma.  IMPRESSION: Cervical straightening.  Early spondylosis.  No acute findings.  Original Report Authenticated By: Cyndie Chime, M.D.     Assessment/Plan 1-Syncope: most likely due to orthostasis; will admit to telemetry, check cardiac enzymes and check 2-D echo to be thorough; provide IVF's resuscitation and check orthostatic vital signs.  2-UTI (lower urinary tract infection): will start patient on rocephin and wait for urine culture.  3-Abdominal pain with associated nausea & vomiting: mild elevated lipase (probably due to pancreatitis 2/2 red yeast) vs just elevation due to phase reaction with ongoing UTI and dehydration. Her abdominal pain could also be 2/2 to UTI and gastritis. Will start PPI BID; start rocephin and follow urine culture; patient on clear liquid diet for bowel rest; IVF's and supportive care.  4-DM 2 (diabetes mellitus):d/c metformin; SSI while in the hospital. Will A1C.  5-HTN (hypertension): not on any medications. BP elevated currently 2/2 to pain meds; will observe for now.  6-High triglycerides: continue fish oil; will also check lipid panel.  7-leg cramps: normal electrolytes; possible neuropathy Vs RLS. Will check TSH and B12  8-Black stool: Hgb is normal; will check FOBT.  9-DVT: lovenox   Time Spent on Admission: 50 minutes  Dusty Wagoner [facility] [phone]  04/24/2011, 5:14 PM

## 2011-04-24 NOTE — ED Notes (Signed)
Three staples placed to posterior scalp lacerated per Dr. Anitra Lauth.

## 2011-04-24 NOTE — ED Notes (Signed)
Patient transported to CT 

## 2011-04-24 NOTE — ED Notes (Signed)
Pt c/o of N/V/D. Reports dark red emesis. Also had syncopal episode this AM while in bathroom. Struck back of head, laceration to scalp.

## 2011-04-24 NOTE — ED Notes (Signed)
#  20 to left AC per ems, given Zofran 4mg  en route. Pt presents immobilized on KED.

## 2011-04-24 NOTE — ED Notes (Signed)
Pt still c/o of nausea, dry heaving. Dr. Anitra Lauth aware.

## 2011-04-24 NOTE — ED Notes (Signed)
ZOX:[id number]<AV> Expected date:04/24/11<BR> Expected time:<BR> Means of arrival:Ambulance<BR> Comments:<BR> 71 fall/N/V/D/small lac

## 2011-04-24 NOTE — ED Provider Notes (Signed)
History     CSN: [id number]  Arrival date & time 04/24/11  6213   First MD Initiated Contact with Patient 04/24/11 [phone]      Chief Complaint  Patient presents with  . Emesis  . Diarrhea  . Loss of Consciousness  . Head Laceration    (Consider location/radiation/quality/duration/timing/severity/associated sxs/prior treatment) Patient is a 71 y.o. female presenting with vomiting, diarrhea, syncope, and scalp laceration. The history is provided by the patient.  Emesis  This is a new problem. The current episode started 1 to 2 hours ago. The problem occurs 2 to 4 times per day. The problem has not changed since onset.The emesis has an appearance of stomach contents and bright red blood (After she syncopized when she awoke she had blood coming from her head and her nose directly after that she threw up blood). There has been no fever. Associated symptoms include cough, diarrhea, headaches, myalgias and URI. Pertinent negatives include no abdominal pain and no fever. Risk factors: None.  Diarrhea The primary symptoms include nausea, vomiting, diarrhea and myalgias. Primary symptoms do not include fever or abdominal pain.  Loss of Consciousness This is a new problem. The current episode started less than 1 hour ago (She was walking into the bathroom because she felt very nauseated and the next thing she knew she was waking up on the floor). The problem occurs constantly. The problem has been resolved. Associated symptoms include headaches and shortness of breath. Pertinent negatives include no abdominal pain. The symptoms are aggravated by nothing. The symptoms are relieved by nothing. She has tried nothing for the symptoms.  Head Laceration This is a new problem. Associated symptoms include headaches and shortness of breath. Pertinent negatives include no abdominal pain.    Past Medical History  Diagnosis Date  . Diabetes mellitus   . Tremors of nervous system   . Kidney stone   .  Hypertension     Past Surgical History  Procedure Date  . Cholecystectomy   . Abdominal hysterectomy     History reviewed. No pertinent family history.  History  Substance Use Topics  . Smoking status: Former Smoker    Types: Cigarettes    Quit date: 04/23/2009  . Smokeless tobacco: Not on file  . Alcohol Use: 0.6 oz/week    1 Glasses of wine per week     Occasionally    OB History    Grav Para Term Preterm Abortions TAB SAB Ect Mult Living                  Review of Systems  Constitutional: Negative for fever.  HENT: Positive for congestion and rhinorrhea.        Has had sinus congestion, rhinorrhea for the last 2 weeks without improvement  Respiratory: Positive for cough and shortness of breath.        Has had a worsening cough over the last 2 weeks and mild shortness of breath  Cardiovascular: Positive for syncope.  Gastrointestinal: Positive for nausea, vomiting and diarrhea. Negative for abdominal pain.  Musculoskeletal: Positive for myalgias.  Neurological: Positive for headaches.  All other systems reviewed and are negative.    Allergies  Review of patient's allergies indicates no known allergies.  Home Medications  No current outpatient prescriptions on file.  BP 139/53  Pulse 60  Temp(Src) 98.2 F (36.8 C) (Oral)  Resp 18  Ht 5\' 5"  (1.651 m)  Wt 150 lb (68.04 kg)  BMI 24.96 kg/m2  SpO2 100%  Physical Exam  Nursing note and vitals reviewed. Constitutional: She is oriented to person, place, and time. She appears well-developed and well-nourished. She appears distressed.  HENT:  Head: Normocephalic. Head is with laceration.    Nose: Mucosal edema and rhinorrhea present. Right sinus exhibits no maxillary sinus tenderness and no frontal sinus tenderness. Left sinus exhibits no maxillary sinus tenderness and no frontal sinus tenderness.  Mouth/Throat: Mucous membranes are dry.       Dried blood in bilateral nares  Eyes: EOM are normal. Pupils are  equal, round, and reactive to light.  Neck: Normal range of motion. Neck supple. No spinous process tenderness and no muscular tenderness present.  Cardiovascular: Normal rate, regular rhythm, normal heart sounds and intact distal pulses.  Exam reveals no friction rub.   No murmur heard. Pulmonary/Chest: Effort normal and breath sounds normal. She has no wheezes. She has no rales.  Abdominal: Soft. Bowel sounds are normal. She exhibits no distension. There is no tenderness. There is no rebound and no guarding.  Musculoskeletal: Normal range of motion. She exhibits no tenderness.       No edema  Neurological: She is alert and oriented to person, place, and time. No cranial nerve deficit.  Skin: Skin is warm and dry. No rash noted. She is not diaphoretic.  Psychiatric: She has a normal mood and affect. Her behavior is normal.    ED Course  Procedures (including critical care time)  Labs Reviewed  CBC - Abnormal; Notable for the following:    WBC 12.8 (*)    All other components within normal limits  DIFFERENTIAL - Abnormal; Notable for the following:    Neutrophils Relative 84 (*)    Neutro Abs 10.7 (*)    Lymphocytes Relative 8 (*)    All other components within normal limits  COMPREHENSIVE METABOLIC PANEL - Abnormal; Notable for the following:    Glucose, Bld 174 (*)    AST 56 (*)    ALT 36 (*)    GFR calc non Af Amer 90 (*)    All other components within normal limits  LIPASE, BLOOD - Abnormal; Notable for the following:    Lipase 110 (*)    All other components within normal limits  URINALYSIS, ROUTINE W REFLEX MICROSCOPIC - Abnormal; Notable for the following:    APPearance CLOUDY (*)    Nitrite POSITIVE (*)    Leukocytes, UA SMALL (*)    All other components within normal limits  URINE MICROSCOPIC-ADD ON - Abnormal; Notable for the following:    Squamous Epithelial / LPF FEW (*)    Bacteria, UA MANY (*)    All other components within normal limits  CARDIAC PANEL(CRET  KIN+CKTOT+MB+TROPI)   Dg Chest 2 View  04/24/2011  *RADIOLOGY REPORT*  Clinical Data: Syncope and fall  CHEST - 2 VIEW  Comparison: 02/07/2010  Findings: Mild cardiomegaly.  Clear lungs.  No pneumothorax.  No obvious acute bony deformity.  IMPRESSION: No active cardiopulmonary disease.  Original Report Authenticated By: Donavan Burnet, M.D.   Ct Head Wo Contrast  04/24/2011  *RADIOLOGY REPORT*  Clinical Data:  Fall, head injury.  Posterior and laceration.  CT HEAD WITHOUT CONTRAST CT CERVICAL SPINE WITHOUT CONTRAST  Technique:  Multidetector CT imaging of the head and cervical spine was performed following the standard protocol without intravenous contrast.  Multiplanar CT image reconstructions of the cervical spine were also generated.  Comparison:  02/18/2007  CT HEAD  Findings: Soft tissue swelling over the left  posterior scalp.  No underlying calvarial abnormality.  Mild cerebral atrophy. Calcification is noted in the frontal lobes bilaterally in the periventricular white matter, stable since prior study.  No hemorrhage.  No acute infarction or hydrocephalus.  No acute calvarial abnormality.  Acute on chronic sinusitis changes.  Small amount of fluid in the left mastoid air cells.  Right mastoids are clear.  IMPRESSION: No acute intracranial abnormality.  Acute on chronic sinusitis.  Fluid within the left mastoid air cells.  CT CERVICAL SPINE  Findings: Loss of normal cervical lordosis.  Prevertebral soft tissues are normal.  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  No fracture.  No epidural or paraspinal hematoma.  IMPRESSION: Cervical straightening.  Early spondylosis.  No acute findings.  Original Report Authenticated By: Cyndie Chime, M.D.   Ct Cervical Spine Wo Contrast  04/24/2011  *RADIOLOGY REPORT*  Clinical Data:  Fall, head injury.  Posterior and laceration.  CT HEAD WITHOUT CONTRAST CT CERVICAL SPINE WITHOUT CONTRAST   Technique:  Multidetector CT imaging of the head and cervical spine was performed following the standard protocol without intravenous contrast.  Multiplanar CT image reconstructions of the cervical spine were also generated.  Comparison:  02/18/2007  CT HEAD  Findings: Soft tissue swelling over the left posterior scalp.  No underlying calvarial abnormality.  Mild cerebral atrophy. Calcification is noted in the frontal lobes bilaterally in the periventricular white matter, stable since prior study.  No hemorrhage.  No acute infarction or hydrocephalus.  No acute calvarial abnormality.  Acute on chronic sinusitis changes.  Small amount of fluid in the left mastoid air cells.  Right mastoids are clear.  IMPRESSION: No acute intracranial abnormality.  Acute on chronic sinusitis.  Fluid within the left mastoid air cells.  CT CERVICAL SPINE  Findings: Loss of normal cervical lordosis.  Prevertebral soft tissues are normal.  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  No fracture.  No epidural or paraspinal hematoma.  IMPRESSION: Cervical straightening.  Early spondylosis.  No acute findings.  Original Report Authenticated By: Cyndie Chime, M.D.    Date: 04/24/2011  Rate: 60  Rhythm: normal sinus rhythm  QRS Axis: normal  Intervals: normal  ST/T Wave abnormalities: normal  Conduction Disutrbances:none  Narrative Interpretation:   Old EKG Reviewed: unchanged  LACERATION REPAIR Performed by: Gwyneth Sprout Authorized byGwyneth Sprout Consent: Verbal consent obtained. Risks and benefits: risks, benefits and alternatives were discussed Consent given by: patient Patient identity confirmed: provided demographic data Prepped and Draped in normal sterile fashion Wound explored  Laceration Location: left scalp  Laceration Length: 3cm  No Foreign Bodies seen or palpated  Anesthesia: local infiltration  Local anesthetic: lidocaine  1% with epinephrine  Anesthetic total: 5 ml  Irrigation method: syringe Amount of cleaning: standard  Skin closure: staples  Number of sutures: 3  Technique: staples  Patient tolerance: Patient tolerated the procedure well with no immediate complications.   No diagnosis found.    MDM   Patient states she was feeling weak and not well last night when she went to bed and this morning woke up and had an episode of diarrhea she states she was going to the bathroom and the next thing she knew she was lying on the floor bleeding from her head and her nose. She then started vomiting blood however most likely the blood was coming from her nose bleed. She states  prior to that she did not have any vomiting with blood. She has no history of GI bleeding in the past. Since falling she's had persistent nausea and vomiting and several other episodes of diarrhea. She denies any abdominal pain and on exam her GCS is 15. She denies any neck pain but does have a laceration to her left scalp. She denies any chest pain but states she is mildly short of breath and has been dealing with sinus issues for the last 2 weeks. She has had persistent cough that is nonproductive. She is a former smoker but states she has never had pneumonia in the past. She denies any fevers, bad food exposure, recent antibiotics. Vital signs are within normal limits on exam. Patient given a second dose of Zofran here she was retching. Will workup for syncope with head and neck CT due to fall. CBC, CMP, cardiac enzymes, EKG, chest x-ray. Also added lipase/UA due to the persistent nausea and vomiting.  10:47 AM  Labs show mild elevation of lipase and LFTs. CBC with mild leukocytosis and UA with concern for possible UTI and pt given rocephin.  Culture sent. On repeat evaluation after multiple doses of antiemetics patient is feeling a little bit better. Wound repaired as above. Will admit for observation.     Gwyneth Sprout,  MD 04/24/11 1050

## 2011-04-24 NOTE — ED Notes (Signed)
Report given to [facility] on 5E

## 2011-04-25 DIAGNOSIS — I059 Rheumatic mitral valve disease, unspecified: Secondary | ICD-10-CM

## 2011-04-25 LAB — LIPID PANEL
LDL Cholesterol: 53 mg/dL (ref 0–99)
VLDL: 61 mg/dL — ABNORMAL HIGH (ref 0–40)

## 2011-04-25 LAB — LIPASE, BLOOD: Lipase: 45 U/L (ref 11–59)

## 2011-04-25 LAB — BASIC METABOLIC PANEL
BUN: 13 mg/dL (ref 6–23)
CO2: 23 mEq/L (ref 19–32)
Calcium: 8.2 mg/dL — ABNORMAL LOW (ref 8.4–10.5)
Creatinine, Ser: 0.7 mg/dL (ref 0.50–1.10)
GFR calc non Af Amer: 85 mL/min — ABNORMAL LOW (ref >90)
Glucose, Bld: 142 mg/dL — ABNORMAL HIGH (ref 70–99)
Sodium: 137 mEq/L (ref 135–145)

## 2011-04-25 LAB — CBC
HCT: 32.2 % — ABNORMAL LOW (ref 36.0–46.0)
Hemoglobin: 11.1 g/dL — ABNORMAL LOW (ref 12.0–15.0)
MCH: 30 pg (ref 26.0–34.0)
MCHC: 34.5 g/dL (ref 30.0–36.0)
MCV: 87 fL (ref 78.0–100.0)

## 2011-04-25 LAB — GLUCOSE, CAPILLARY
Glucose-Capillary: 144 mg/dL — ABNORMAL HIGH (ref 70–99)
Glucose-Capillary: 129 mg/dL — ABNORMAL HIGH (ref 70–99)

## 2011-04-25 LAB — CARDIAC PANEL(CRET KIN+CKTOT+MB+TROPI)
CK, MB: 1.7 ng/mL (ref 0.3–4.0)
Total CK: 41 U/L (ref 7–177)
Total CK: 46 U/L (ref 7–177)

## 2011-04-25 MED ORDER — DEXTROSE 5 % IV SOLN
1.0000 g | INTRAVENOUS | Status: DC
  Administered 2011-04-25: 1 g via INTRAVENOUS
  Filled 2011-04-25 (×2): qty 10

## 2011-04-25 MED ORDER — ZOLPIDEM TARTRATE 5 MG PO TABS
5.0000 mg | ORAL_TABLET | Freq: Every evening | ORAL | Status: DC | PRN
  Administered 2011-04-25: 5 mg via ORAL
  Filled 2011-04-25: qty 1

## 2011-04-25 NOTE — Progress Notes (Signed)
  Echocardiogram 2D Echocardiogram has been performed.  Rebecca Flores Nira Retort 04/25/2011, 10:01 AM

## 2011-04-25 NOTE — Progress Notes (Signed)
Subjective: Admission H&P reviewed, apparently patient had nausea vomiting some diarrhea. Had a syncopal spell. Presented to the ED. He is some report that she had dark emesis and dark stool. Patient does endorse taking NSAIDs, denies any history of GI bleed, hemoglobin has remained stable. Since admission to the floor no recurrent emesis. The son states she has been a little confused and submitted. CT of her head was negative for any acute pathology. She did require laceration repair in the emergency room to her occiput  Objective: Vital signs in last 24 hours: Temp:  [98.1 F (36.7 C)-98.7 F (37.1 C)] 98.7 F (37.1 C) (03/02 0544) Pulse Rate:  [56-76] 56  (03/02 0544) Resp:  [17-24] 17  (03/02 0544) BP: (92-150)/(44-73) 92/44 mmHg (03/02 0544) SpO2:  [94 %-96 %] 96 % (03/02 0544) Weight:  [67.4 kg (148 lb 9.4 oz)] 67.4 kg (148 lb 9.4 oz) (03/01 1402) Weight change:  Last BM Date: 04/24/11  Intake/Output from previous day: 03/01 0701 - 03/02 0700 In: 467.5 [I.V.:467.5] Out: 800 [Urine:800] Intake/Output this shift:    HEENT exam unremarkable Chest clear without rales or rhonchi Cardiovascular regular S1-S2 no S3 appreciated Abdomen slight protuberant, soft no organomegaly no guarding no rebound tenderness Extremities no edema  Lab Results:  Results for orders placed during the hospital encounter of 04/24/11 (from the past 24 hour(s))  VITAMIN B12     Status: Normal   Collection Time   04/24/11  6:00 PM      Component Value Range   Vitamin B-12 312  211 - 911 (pg/mL)  CBC     Status: Normal   Collection Time   04/24/11  6:00 PM      Component Value Range   WBC 9.2  4.0 - 10.5 (K/uL)   RBC 4.30  3.87 - 5.11 (MIL/uL)   Hemoglobin 12.7  12.0 - 15.0 (g/dL)   HCT 09.8  11.9 - 14.7 (%)   MCV 87.0  78.0 - 100.0 (fL)   MCH 29.5  26.0 - 34.0 (pg)   MCHC 34.0  30.0 - 36.0 (g/dL)   RDW 82.9  56.2 - 13.0 (%)   Platelets 219  150 - 400 (K/uL)  PHOSPHORUS     Status: Normal   Collection Time   04/24/11  6:00 PM      Component Value Range   Phosphorus 3.0  2.3 - 4.6 (mg/dL)  MAGNESIUM     Status: Normal   Collection Time   04/24/11  6:00 PM      Component Value Range   Magnesium 1.6  1.5 - 2.5 (mg/dL)  TSH     Status: Normal   Collection Time   04/24/11  6:00 PM      Component Value Range   TSH 1.162  0.350 - 4.500 (uIU/mL)  HEMOGLOBIN A1C     Status: Abnormal   Collection Time   04/24/11  6:00 PM      Component Value Range   Hemoglobin A1C 6.8 (*) <5.7 (%)   Mean Plasma Glucose 148 (*) <117 (mg/dL)  CARDIAC PANEL(CRET KIN+CKTOT+MB+TROPI)     Status: Normal   Collection Time   04/24/11  6:00 PM      Component Value Range   Total CK 47  7 - 177 (U/L)   CK, MB 2.3  0.3 - 4.0 (ng/mL)   Troponin I <0.30  <0.30 (ng/mL)   Relative Index RELATIVE INDEX IS INVALID  0.0 - 2.5   GLUCOSE, CAPILLARY  Status: Abnormal   Collection Time   04/24/11  9:43 PM      Component Value Range   Glucose-Capillary 156 (*) 70 - 99 (mg/dL)   Comment 1 Notify RN    CARDIAC PANEL(CRET KIN+CKTOT+MB+TROPI)     Status: Normal   Collection Time   04/25/11  1:40 AM      Component Value Range   Total CK 41  7 - 177 (U/L)   CK, MB 1.7  0.3 - 4.0 (ng/mL)   Troponin I <0.30  <0.30 (ng/mL)   Relative Index RELATIVE INDEX IS INVALID  0.0 - 2.5   CBC     Status: Abnormal   Collection Time   04/25/11  5:49 AM      Component Value Range   WBC 5.6  4.0 - 10.5 (K/uL)   RBC 3.70 (*) 3.87 - 5.11 (MIL/uL)   Hemoglobin 11.1 (*) 12.0 - 15.0 (g/dL)   HCT 78.2 (*) 95.6 - 46.0 (%)   MCV 87.0  78.0 - 100.0 (fL)   MCH 30.0  26.0 - 34.0 (pg)   MCHC 34.5  30.0 - 36.0 (g/dL)   RDW 21.3  08.6 - 57.8 (%)   Platelets 180  150 - 400 (K/uL)  BASIC METABOLIC PANEL     Status: Abnormal   Collection Time   04/25/11  5:49 AM      Component Value Range   Sodium 137  135 - 145 (mEq/L)   Potassium 3.4 (*) 3.5 - 5.1 (mEq/L)   Chloride 107  96 - 112 (mEq/L)   CO2 23  19 - 32 (mEq/L)   Glucose, Bld 142 (*) 70 - 99  (mg/dL)   BUN 13  6 - 23 (mg/dL)   Creatinine, Ser 4.69  0.50 - 1.10 (mg/dL)   Calcium 8.2 (*) 8.4 - 10.5 (mg/dL)   GFR calc non Af Amer 85 (*) >90 (mL/min)   GFR calc Af Amer >90  >90 (mL/min)  LIPID PANEL     Status: Abnormal   Collection Time   04/25/11  5:49 AM      Component Value Range   Cholesterol 147  0 - 200 (mg/dL)   Triglycerides 629 (*) <150 (mg/dL)   HDL 33 (*) >52 (mg/dL)   Total CHOL/HDL Ratio 4.5     VLDL 61 (*) 0 - 40 (mg/dL)   LDL Cholesterol 53  0 - 99 (mg/dL)  GLUCOSE, CAPILLARY     Status: Abnormal   Collection Time   04/25/11  7:33 AM      Component Value Range   Glucose-Capillary 144 (*) 70 - 99 (mg/dL)   Comment 1 Notify RN    CARDIAC PANEL(CRET KIN+CKTOT+MB+TROPI)     Status: Normal   Collection Time   04/25/11  9:57 AM      Component Value Range   Total CK 46  7 - 177 (U/L)   CK, MB 1.9  0.3 - 4.0 (ng/mL)   Troponin I <0.30  <0.30 (ng/mL)   Relative Index RELATIVE INDEX IS INVALID  0.0 - 2.5   GLUCOSE, CAPILLARY     Status: Abnormal   Collection Time   04/25/11 11:26 AM      Component Value Range   Glucose-Capillary 111 (*) 70 - 99 (mg/dL)   Comment 1 Notify RN        Studies/Results: Dg Chest 2 View  04/24/2011  *RADIOLOGY REPORT*  Clinical Data: Syncope and fall  CHEST - 2 VIEW  Comparison: 02/07/2010  Findings: Mild cardiomegaly.  Clear lungs.  No pneumothorax.  No obvious acute bony deformity.  IMPRESSION: No active cardiopulmonary disease.  Original Report Authenticated By: Donavan Burnet, M.D.   Ct Head Wo Contrast  04/24/2011  *RADIOLOGY REPORT*  Clinical Data:  Fall, head injury.  Posterior and laceration.  CT HEAD WITHOUT CONTRAST CT CERVICAL SPINE WITHOUT CONTRAST  Technique:  Multidetector CT imaging of the head and cervical spine was performed following the standard protocol without intravenous contrast.  Multiplanar CT image reconstructions of the cervical spine were also generated.  Comparison:  02/18/2007  CT HEAD  Findings: Soft tissue  swelling over the left posterior scalp.  No underlying calvarial abnormality.  Mild cerebral atrophy. Calcification is noted in the frontal lobes bilaterally in the periventricular white matter, stable since prior study.  No hemorrhage.  No acute infarction or hydrocephalus.  No acute calvarial abnormality.  Acute on chronic sinusitis changes.  Small amount of fluid in the left mastoid air cells.  Right mastoids are clear.  IMPRESSION: No acute intracranial abnormality.  Acute on chronic sinusitis.  Fluid within the left mastoid air cells.  CT CERVICAL SPINE  Findings: Loss of normal cervical lordosis.  Prevertebral soft tissues are normal.  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  No fracture.  No epidural or paraspinal hematoma.  IMPRESSION: Cervical straightening.  Early spondylosis.  No acute findings.  Original Report Authenticated By: Cyndie Chime, M.D.   Ct Cervical Spine Wo Contrast  04/24/2011  *RADIOLOGY REPORT*  Clinical Data:  Fall, head injury.  Posterior and laceration.  CT HEAD WITHOUT CONTRAST CT CERVICAL SPINE WITHOUT CONTRAST  Technique:  Multidetector CT imaging of the head and cervical spine was performed following the standard protocol without intravenous contrast.  Multiplanar CT image reconstructions of the cervical spine were also generated.  Comparison:  02/18/2007  CT HEAD  Findings: Soft tissue swelling over the left posterior scalp.  No underlying calvarial abnormality.  Mild cerebral atrophy. Calcification is noted in the frontal lobes bilaterally in the periventricular white matter, stable since prior study.  No hemorrhage.  No acute infarction or hydrocephalus.  No acute calvarial abnormality.  Acute on chronic sinusitis changes.  Small amount of fluid in the left mastoid air cells.  Right mastoids are clear.  IMPRESSION: No acute intracranial abnormality.  Acute on chronic sinusitis.  Fluid within the left mastoid  air cells.  CT CERVICAL SPINE  Findings: Loss of normal cervical lordosis.  Prevertebral soft tissues are normal.  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  No fracture.  No epidural or paraspinal hematoma.  IMPRESSION: Cervical straightening.  Early spondylosis.  No acute findings.  Original Report Authenticated By: Cyndie Chime, M.D.    Medications:  Prior to Admission:  Prescriptions prior to admission  Medication Sig Dispense Refill  . aspirin 81 MG chewable tablet Chew 81 mg by mouth every other day.      . Cinnamon 500 MG TABS Take 1 tablet by mouth daily.      . fish oil-omega-3 fatty acids 1000 MG capsule Take 1 g by mouth daily.      . metFORMIN (GLUCOPHAGE) 500 MG tablet Take 500 mg by mouth 2 (two) times daily with a meal.      . nystatin-triamcinolone (MYCOLOG II) cream Apply 1 application topically 2 (two) times daily as needed.      Marland Kitchen  primidone (MYSOLINE) 50 MG tablet Take 50 mg by mouth 2 (two) times daily.       Scheduled:   . sodium chloride   Intravenous STAT  . aspirin  81 mg Oral QODAY  . enoxaparin  40 mg Subcutaneous Q24H  . insulin aspart  0-5 Units Subcutaneous QHS  . insulin aspart  0-9 Units Subcutaneous TID WC  . omega-3 acid ethyl esters  1 g Oral Daily  . ondansetron      . ondansetron      . pantoprazole  40 mg Oral BID AC  . primidone  50 mg Oral BID  . DISCONTD: fish oil-omega-3 fatty acids  1 g Oral Daily   Continuous:   . sodium chloride 75 mL/hr at 04/25/11 1157    Assessment/Plan: Syncope, etiology includes vasovagal, orthostasis. With report of dark emesis rule out GI bleed. Continue monitor, check followup H&H Diabetes well-controlled, rule out metformin causing some of her nausea/GI symptoms, History of hypertension relative hypotension now, continue IV fluids UTI continue treatment Laceration status post fall, please note CT without pathology  LOS: 1 day    Octaviano Mukai D 04/25/2011, 12:59 PM

## 2011-04-26 LAB — GLUCOSE, CAPILLARY
Glucose-Capillary: 134 mg/dL — ABNORMAL HIGH (ref 70–99)
Glucose-Capillary: 104 mg/dL — ABNORMAL HIGH (ref 70–99)
Glucose-Capillary: 119 mg/dL — ABNORMAL HIGH (ref 70–99)

## 2011-04-26 LAB — BASIC METABOLIC PANEL
BUN: 10 mg/dL (ref 6–23)
CO2: 25 mEq/L (ref 19–32)
Chloride: 109 mEq/L (ref 96–112)
GFR calc non Af Amer: 85 mL/min — ABNORMAL LOW (ref >90)
Glucose, Bld: 139 mg/dL — ABNORMAL HIGH (ref 70–99)
Potassium: 3.6 mEq/L (ref 3.5–5.1)
Sodium: 140 mEq/L (ref 135–145)

## 2011-04-26 LAB — CBC
MCH: 29.9 pg (ref 26.0–34.0)
MCV: 87.6 fL (ref 78.0–100.0)
Platelets: 178 10*3/uL (ref 150–400)
RDW: 12.6 % (ref 11.5–15.5)

## 2011-04-26 MED ORDER — CIPROFLOXACIN HCL 250 MG PO TABS
250.0000 mg | ORAL_TABLET | Freq: Two times a day (BID) | ORAL | Status: DC
  Administered 2011-04-26 – 2011-04-27 (×2): 250 mg via ORAL
  Filled 2011-04-26 (×3): qty 1

## 2011-04-26 NOTE — Progress Notes (Signed)
Subjective: Patient doing well, there is no complaint of fever chills nausea vomiting no abdominal pain. She still has not had a bowel movement. Her blood pressure is much better today  Objective: Vital signs in last 24 hours: Temp:  [97.7 F (36.5 C)-98.7 F (37.1 C)] 98.7 F (37.1 C) (03/03 0624) Pulse Rate:  [47-56] 54  (03/03 0626) Resp:  [18] 18  (03/03 0624) BP: (110-154)/(57-72) 149/72 mmHg (03/03 0626) SpO2:  [94 %-97 %] 97 % (03/03 0624) Weight change:  Last BM Date: 04/25/11  Intake/Output from previous day:   Intake/Output this shift:    General appearance: alert and cooperative Resp: clear to auscultation bilaterally Cardio: regular rate and rhythm, S1, S2 normal, no murmur, click, rub or gallop GI: soft, non-tender; bowel sounds normal; no masses,  no organomegaly Extremities: extremities normal, atraumatic, no cyanosis or edema  Lab Results:  Results for orders placed during the hospital encounter of 04/24/11 (from the past 24 hour(s))  GLUCOSE, CAPILLARY     Status: Abnormal   Collection Time   04/25/11  5:06 PM      Component Value Range   Glucose-Capillary 125 (*) 70 - 99 (mg/dL)   Comment 1 Notify RN    GLUCOSE, CAPILLARY     Status: Abnormal   Collection Time   04/25/11  9:20 PM      Component Value Range   Glucose-Capillary 129 (*) 70 - 99 (mg/dL)  CBC     Status: Abnormal   Collection Time   04/26/11  5:50 AM      Component Value Range   WBC 5.2  4.0 - 10.5 (K/uL)   RBC 3.78 (*) 3.87 - 5.11 (MIL/uL)   Hemoglobin 11.3 (*) 12.0 - 15.0 (g/dL)   HCT 29.5 (*) 62.1 - 46.0 (%)   MCV 87.6  78.0 - 100.0 (fL)   MCH 29.9  26.0 - 34.0 (pg)   MCHC 34.1  30.0 - 36.0 (g/dL)   RDW 30.8  65.7 - 84.6 (%)   Platelets 178  150 - 400 (K/uL)  BASIC METABOLIC PANEL     Status: Abnormal   Collection Time   04/26/11  5:50 AM      Component Value Range   Sodium 140  135 - 145 (mEq/L)   Potassium 3.6  3.5 - 5.1 (mEq/L)   Chloride 109  96 - 112 (mEq/L)   CO2 25  19 - 32  (mEq/L)   Glucose, Bld 139 (*) 70 - 99 (mg/dL)   BUN 10  6 - 23 (mg/dL)   Creatinine, Ser 9.62  0.50 - 1.10 (mg/dL)   Calcium 8.3 (*) 8.4 - 10.5 (mg/dL)   GFR calc non Af Amer 85 (*) >90 (mL/min)   GFR calc Af Amer >90  >90 (mL/min)  GLUCOSE, CAPILLARY     Status: Abnormal   Collection Time   04/26/11  7:11 AM      Component Value Range   Glucose-Capillary 134 (*) 70 - 99 (mg/dL)   Comment 1 Notify RN    GLUCOSE, CAPILLARY     Status: Abnormal   Collection Time   04/26/11 11:28 AM      Component Value Range   Glucose-Capillary 104 (*) 70 - 99 (mg/dL)   Comment 1 Notify RN        Studies/Results: No results found.  Medications:  Prior to Admission:  Prescriptions prior to admission  Medication Sig Dispense Refill  . aspirin 81 MG chewable tablet Chew 81 mg by  mouth every other day.      . Cinnamon 500 MG TABS Take 1 tablet by mouth daily.      . fish oil-omega-3 fatty acids 1000 MG capsule Take 1 g by mouth daily.      . metFORMIN (GLUCOPHAGE) 500 MG tablet Take 500 mg by mouth 2 (two) times daily with a meal.      . nystatin-triamcinolone (MYCOLOG II) cream Apply 1 application topically 2 (two) times daily as needed.      . primidone (MYSOLINE) 50 MG tablet Take 50 mg by mouth 2 (two) times daily.       Scheduled:   . cefTRIAXone (ROCEPHIN)  IV  1 g Intravenous Q24H  . insulin aspart  0-5 Units Subcutaneous QHS  . insulin aspart  0-9 Units Subcutaneous TID WC  . omega-3 acid ethyl esters  1 g Oral Daily  . pantoprazole  40 mg Oral BID AC  . primidone  50 mg Oral BID   Continuous:   . sodium chloride 125 mL/hr at 04/26/11 0044    Assessment/Plan: Syncope with resultant laceration status post fall. Differential diagnosis the same, no recurrent events, blood pressure better after IV fluids. 2-D echo normal Nausea vomiting no recurrence, patient did report dark emesis. H&H stable UTI change antibiotics to oral Diabetes resume oral meds as by mouth intake allows  LOS: 2  days   Kenyetta Wimbish D 04/26/2011, 1:46 PM

## 2011-04-27 MED ORDER — CIPROFLOXACIN HCL 250 MG PO TABS: 250.0000 mg | ORAL_TABLET | Freq: Two times a day (BID) | ORAL | Status: AC

## 2011-04-27 NOTE — Progress Notes (Signed)
DC instructions reviewed with patient and her daughter.  Patient denies further questions or concerns at this time.  Rx given for antibiotic.  Home meds carefully reviewed with patient.  No changes noted since am assessment.  IV DC.  Patient is to call MD to schedule a f/u appt and to get the staples removed.  Patient DC to home via her daughter.  Patient advised of the valuables policy and reports to have all of her belongings with her.

## 2011-04-27 NOTE — Discharge Summary (Signed)
Physician Discharge Summary  NAME:Rebecca Flores  ZOX:[id number]  DOB: 10-09-1940   Admit date: 04/24/2011 Discharge date: 04/27/2011  Discharge Diagnoses:  Active Problems: Syncope - no further symptoms. Likely secondary to dehydration and UTI UTI (lower urinary tract infection) -  Abdominal pain -  resolved with treatment of UTI Nausea & vomiting - resolved with treatment of UTI DM (diabetes mellitus) - continue usual meds as outpatient HTN (hypertension) - controlled High triglycerides Head laceration - remove staples in one week Tremor - chronic, stable  Discharge Condition: Much improved  Hospital Course: 71 year old female with history of chronic essential tremors, hypertension, COPD with long history of tobacco abuse in the past. Has not smoked for over a year. Patient had syncope at home and had noticed that her sugars were going higher and she was trying to cut back on eating and drinking to prevent his sugars. She also had some diarrhea. She uses red yeast rice for cholesterol therapy. Patient's left parietal scalp area had a laceration which was stapled in the emergency room. Her white cell count was mildly elevated on admission at 12,800 and urinalysis suggested UTI. She was started on Rocephin. 2-D echocardiogram performed during this admission revealed an EF of 60-65% with trivial mitral regurg. Otherwise normal.   Filed Vitals:   04/26/11 0626 04/26/11 1400 04/26/11 2223 04/27/11 0717  BP: 149/72 154/71 159/64 126/48  Pulse: 54 54 59 53  Temp:  98.1 F (36.7 C) 98.6 F (37 C) 98.5 F (36.9 C)  TempSrc:  Oral Oral Oral  Resp:  18 18 18   Height:      Weight:      SpO2:  93% 95% 96%   Physical Exam  Nursing note and vitals reviewed.  Constitutional: She is oriented to person, place, and time. She appears well-developed and well-nourished. She is comfortable HENT: Benign except for slight head tremor. Mild ecchymosis in the medial aspect of the left orbital area Head:  Normocephalic. Head is with healing laceration stapled, nonerythematous and nondraining over the left parietal area Nose: Right sinus exhibits no maxillary sinus tenderness and no frontal sinus tenderness. Left sinus exhibits no maxillary sinus tenderness and no frontal sinus tenderness.  Mouth/Throat: Mucous membranes are moist. Neck: Normal range of motion. Neck supple. No spinous process tenderness and no muscular tenderness present.  Cardiovascular: Normal rate, regular rhythm, normal heart sounds and intact distal pulses. Exam reveals no friction rub.  No murmur heard.  Pulmonary/Chest: Effort normal and breath sounds normal. She has no wheezes. She has no rales.  Abdominal: Soft. Bowel sounds are normal. She exhibits no distension. There is no tenderness. There is no rebound and no guarding.  Musculoskeletal: Normal range of motion. She exhibits no tenderness.  No edema  Neurological: She is alert and oriented to person, place, and time. No cranial nerve deficit.  Skin: Skin is warm and dry. No rash noted. She is not diaphoretic.  Psychiatric: She has a normal mood and affect. Her behavior is normal.    Consults: Noncontributory  Disposition: Much improved overall. Discharged home with family members to stay with her for the next 24-48 hours to make sure she is stable. We'll have physical therapy assess prior to discharge   Medication List  As of 04/27/2011  7:47 AM   ASK your doctor about these medications         aspirin 81 MG chewable tablet   Chew 81 mg by mouth every other day.      Cinnamon 500  MG Tabs   Take 1 tablet by mouth daily.      fish oil-omega-3 fatty acids 1000 MG capsule   Take 1 g by mouth daily.      metFORMIN 500 MG tablet   Commonly known as: GLUCOPHAGE   Take 500 mg by mouth 2 (two) times daily with a meal.      nystatin-triamcinolone cream   Commonly known as: MYCOLOG II   Apply 1 application topically 2 (two) times daily as needed.      primidone  50 MG tablet   Commonly known as: MYSOLINE   Take 50 mg by mouth 2 (two) times daily.             Things to follow up in the outpatient setting: Notify M.D. as directed. Any lightheadedness or dizziness or unsteadiness on feet, contact M.D.  Time coordinating discharge: 35 minutes  The results of significant diagnostics from this hospitalization (including imaging, microbiology, ancillary and laboratory) are listed below for reference.    Significant Diagnostic Studies: Dg Chest 2 View  04/24/2011  *RADIOLOGY REPORT*  Clinical Data: Syncope and fall  CHEST - 2 VIEW  Comparison: 02/07/2010  Findings: Mild cardiomegaly.  Clear lungs.  No pneumothorax.  No obvious acute bony deformity.  IMPRESSION: No active cardiopulmonary disease.  Original Report Authenticated By: Donavan Burnet, M.D.   Ct Head Wo Contrast  04/24/2011  *RADIOLOGY REPORT*  Clinical Data:  Fall, head injury.  Posterior and laceration.  CT HEAD WITHOUT CONTRAST CT CERVICAL SPINE WITHOUT CONTRAST  Technique:  Multidetector CT imaging of the head and cervical spine was performed following the standard protocol without intravenous contrast.  Multiplanar CT image reconstructions of the cervical spine were also generated.  Comparison:  02/18/2007  CT HEAD  Findings: Soft tissue swelling over the left posterior scalp.  No underlying calvarial abnormality.  Mild cerebral atrophy. Calcification is noted in the frontal lobes bilaterally in the periventricular white matter, stable since prior study.  No hemorrhage.  No acute infarction or hydrocephalus.  No acute calvarial abnormality.  Acute on chronic sinusitis changes.  Small amount of fluid in the left mastoid air cells.  Right mastoids are clear.  IMPRESSION: No acute intracranial abnormality.  Acute on chronic sinusitis.  Fluid within the left mastoid air cells.  CT CERVICAL SPINE  Findings: Loss of normal cervical lordosis.  Prevertebral soft tissues are normal.  Degenerative disc  disease changes in the lower cervical spine with this space narrowing and early spurring.  No fracture.  No epidural or paraspinal hematoma.  IMPRESSION: Cervical straightening.  Early spondylosis.  No acute findings.  Original Report Authenticated By: Cyndie Chime, M.D.   Ct Cervical Spine Wo Contrast  04/24/2011  *RADIOLOGY REPORT*  Clinical Data:  Fall, head injury.  Posterior and laceration.  CT HEAD WITHOUT CONTRAST CT CERVICAL SPINE WITHOUT CONTRAST  Technique:  Multidetector CT imaging of the head and cervical spine was performed following the standard protocol without intravenous contrast.  Multiplanar CT image reconstructions of the cervical spine were also generated.  Comparison:  02/18/2007  CT HEAD  Findings: Soft tissue swelling over the left posterior scalp.  No underlying calvarial abnormality.  Mild cerebral atrophy. Calcification is noted in the frontal lobes bilaterally in the periventricular white matter, stable since prior study.  No hemorrhage.  No acute infarction or hydrocephalus.  No acute calvarial abnormality.  Acute on chronic sinusitis changes.  Small amount of fluid in the left mastoid air cells.  Right mastoids are clear.  IMPRESSION: No acute intracranial abnormality.  Acute on chronic sinusitis.  Fluid within the left mastoid air cells.  CT CERVICAL SPINE  Findings: Loss of normal cervical lordosis.  Prevertebral soft tissues are normal.  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  No fracture.  No epidural or paraspinal hematoma.  IMPRESSION: Cervical straightening.  Early spondylosis.  No acute findings.  Original Report Authenticated By: Cyndie Chime, M.D.    Microbiology: No results found for this or any previous visit (from the past 240 hour(s)).   Labs: Results for orders placed during the hospital encounter of 04/24/11  CBC      Component Value Range   WBC 12.8 (*) 4.0 - 10.5 (K/uL)   RBC 4.25   3.87 - 5.11 (MIL/uL)   Hemoglobin 12.3  12.0 - 15.0 (g/dL)   HCT 40.9  81.1 - 91.4 (%)   MCV 87.5  78.0 - 100.0 (fL)   MCH 28.9  26.0 - 34.0 (pg)   MCHC 33.1  30.0 - 36.0 (g/dL)   RDW 78.2  95.6 - 21.3 (%)   Platelets 213  150 - 400 (K/uL)  DIFFERENTIAL      Component Value Range   Neutrophils Relative 84 (*) 43 - 77 (%)   Neutro Abs 10.7 (*) 1.7 - 7.7 (K/uL)   Lymphocytes Relative 8 (*) 12 - 46 (%)   Lymphs Abs 1.1  0.7 - 4.0 (K/uL)   Monocytes Relative 6  3 - 12 (%)   Monocytes Absolute 0.7  0.1 - 1.0 (K/uL)   Eosinophils Relative 2  0 - 5 (%)   Eosinophils Absolute 0.3  0.0 - 0.7 (K/uL)   Basophils Relative 0  0 - 1 (%)   Basophils Absolute 0.0  0.0 - 0.1 (K/uL)  COMPREHENSIVE METABOLIC PANEL      Component Value Range   Sodium 138  135 - 145 (mEq/L)   Potassium 4.0  3.5 - 5.1 (mEq/L)   Chloride 105  96 - 112 (mEq/L)   CO2 21  19 - 32 (mEq/L)   Glucose, Bld 174 (*) 70 - 99 (mg/dL)   BUN 19  6 - 23 (mg/dL)   Creatinine, Ser 0.86  0.50 - 1.10 (mg/dL)   Calcium 9.2  8.4 - 57.8 (mg/dL)   Total Protein 6.7  6.0 - 8.3 (g/dL)   Albumin 4.0  3.5 - 5.2 (g/dL)   AST 56 (*) 0 - 37 (U/L)   ALT 36 (*) 0 - 35 (U/L)   Alkaline Phosphatase 78  39 - 117 (U/L)   Total Bilirubin 0.6  0.3 - 1.2 (mg/dL)   GFR calc non Af Amer 90 (*) >90 (mL/min)   GFR calc Af Amer >90  >90 (mL/min)  LIPASE, BLOOD      Component Value Range   Lipase 110 (*) 11 - 59 (U/L)  URINALYSIS, ROUTINE W REFLEX MICROSCOPIC      Component Value Range   Color, Urine YELLOW  YELLOW    APPearance CLOUDY (*) CLEAR    Specific Gravity, Urine 1.018  1.005 - 1.030    pH 5.5  5.0 - 8.0    Glucose, UA NEGATIVE  NEGATIVE (mg/dL)   Hgb urine dipstick NEGATIVE  NEGATIVE    Bilirubin Urine NEGATIVE  NEGATIVE    Ketones, ur NEGATIVE  NEGATIVE (mg/dL)   Protein, ur NEGATIVE  NEGATIVE (mg/dL)   Urobilinogen, UA 0.2  0.0 -  1.0 (mg/dL)   Nitrite POSITIVE (*) NEGATIVE    Leukocytes, UA SMALL (*) NEGATIVE   CARDIAC PANEL(CRET  KIN+CKTOT+MB+TROPI)      Component Value Range   Total CK 52  7 - 177 (U/L)   CK, MB 2.8  0.3 - 4.0 (ng/mL)   Troponin I <0.30  <0.30 (ng/mL)   Relative Index RELATIVE INDEX IS INVALID  0.0 - 2.5   URINE MICROSCOPIC-ADD ON      Component Value Range   Squamous Epithelial / LPF FEW (*) RARE    WBC, UA 11-20  <3 (WBC/hpf)   Bacteria, UA MANY (*) RARE    Urine-Other MUCOUS PRESENT    VITAMIN B12      Component Value Range   Vitamin B-12 312  211 - 911 (pg/mL)  CBC      Component Value Range   WBC 9.2  4.0 - 10.5 (K/uL)   RBC 4.30  3.87 - 5.11 (MIL/uL)   Hemoglobin 12.7  12.0 - 15.0 (g/dL)   HCT 16.1  09.6 - 04.5 (%)   MCV 87.0  78.0 - 100.0 (fL)   MCH 29.5  26.0 - 34.0 (pg)   MCHC 34.0  30.0 - 36.0 (g/dL)   RDW 40.9  81.1 - 91.4 (%)   Platelets 219  150 - 400 (K/uL)  PHOSPHORUS      Component Value Range   Phosphorus 3.0  2.3 - 4.6 (mg/dL)  MAGNESIUM      Component Value Range   Magnesium 1.6  1.5 - 2.5 (mg/dL)  TSH      Component Value Range   TSH 1.162  0.350 - 4.500 (uIU/mL)  HEMOGLOBIN A1C      Component Value Range   Hemoglobin A1C 6.8 (*) <5.7 (%)   Mean Plasma Glucose 148 (*) <117 (mg/dL)  CBC      Component Value Range   WBC 5.6  4.0 - 10.5 (K/uL)   RBC 3.70 (*) 3.87 - 5.11 (MIL/uL)   Hemoglobin 11.1 (*) 12.0 - 15.0 (g/dL)   HCT 78.2 (*) 95.6 - 46.0 (%)   MCV 87.0  78.0 - 100.0 (fL)   MCH 30.0  26.0 - 34.0 (pg)   MCHC 34.5  30.0 - 36.0 (g/dL)   RDW 21.3  08.6 - 57.8 (%)   Platelets 180  150 - 400 (K/uL)  BASIC METABOLIC PANEL      Component Value Range   Sodium 137  135 - 145 (mEq/L)   Potassium 3.4 (*) 3.5 - 5.1 (mEq/L)   Chloride 107  96 - 112 (mEq/L)   CO2 23  19 - 32 (mEq/L)   Glucose, Bld 142 (*) 70 - 99 (mg/dL)   BUN 13  6 - 23 (mg/dL)   Creatinine, Ser 4.69  0.50 - 1.10 (mg/dL)   Calcium 8.2 (*) 8.4 - 10.5 (mg/dL)   GFR calc non Af Amer 85 (*) >90 (mL/min)   GFR calc Af Amer >90  >90 (mL/min)  LIPID PANEL      Component Value Range    Cholesterol 147  0 - 200 (mg/dL)   Triglycerides 629 (*) <150 (mg/dL)   HDL 33 (*) >52 (mg/dL)   Total CHOL/HDL Ratio 4.5     VLDL 61 (*) 0 - 40 (mg/dL)   LDL Cholesterol 53  0 - 99 (mg/dL)  CARDIAC PANEL(CRET KIN+CKTOT+MB+TROPI)      Component Value Range   Total CK 47  7 - 177 (U/L)   CK, MB 2.3  0.3 - 4.0 (ng/mL)   Troponin I <0.30  <0.30 (ng/mL)   Relative Index RELATIVE INDEX IS INVALID  0.0 - 2.5   CARDIAC PANEL(CRET KIN+CKTOT+MB+TROPI)      Component Value Range   Total CK 41  7 - 177 (U/L)   CK, MB 1.7  0.3 - 4.0 (ng/mL)   Troponin I <0.30  <0.30 (ng/mL)   Relative Index RELATIVE INDEX IS INVALID  0.0 - 2.5   CARDIAC PANEL(CRET KIN+CKTOT+MB+TROPI)      Component Value Range   Total CK 46  7 - 177 (U/L)   CK, MB 1.9  0.3 - 4.0 (ng/mL)   Troponin I <0.30  <0.30 (ng/mL)   Relative Index RELATIVE INDEX IS INVALID  0.0 - 2.5   GLUCOSE, CAPILLARY      Component Value Range   Glucose-Capillary 156 (*) 70 - 99 (mg/dL)   Comment 1 Notify RN    LIPASE, BLOOD      Component Value Range   Lipase 45  11 - 59 (U/L)  GLUCOSE, CAPILLARY      Component Value Range   Glucose-Capillary 144 (*) 70 - 99 (mg/dL)   Comment 1 Notify RN    GLUCOSE, CAPILLARY      Component Value Range   Glucose-Capillary 111 (*) 70 - 99 (mg/dL)   Comment 1 Notify RN    CBC      Component Value Range   WBC 5.2  4.0 - 10.5 (K/uL)   RBC 3.78 (*) 3.87 - 5.11 (MIL/uL)   Hemoglobin 11.3 (*) 12.0 - 15.0 (g/dL)   HCT 16.1 (*) 09.6 - 46.0 (%)   MCV 87.6  78.0 - 100.0 (fL)   MCH 29.9  26.0 - 34.0 (pg)   MCHC 34.1  30.0 - 36.0 (g/dL)   RDW 04.5  40.9 - 81.1 (%)   Platelets 178  150 - 400 (K/uL)  BASIC METABOLIC PANEL      Component Value Range   Sodium 140  135 - 145 (mEq/L)   Potassium 3.6  3.5 - 5.1 (mEq/L)   Chloride 109  96 - 112 (mEq/L)   CO2 25  19 - 32 (mEq/L)   Glucose, Bld 139 (*) 70 - 99 (mg/dL)   BUN 10  6 - 23 (mg/dL)   Creatinine, Ser 9.14  0.50 - 1.10 (mg/dL)   Calcium 8.3 (*) 8.4 - 10.5  (mg/dL)   GFR calc non Af Amer 85 (*) >90 (mL/min)   GFR calc Af Amer >90  >90 (mL/min)  GLUCOSE, CAPILLARY      Component Value Range   Glucose-Capillary 125 (*) 70 - 99 (mg/dL)   Comment 1 Notify RN    GLUCOSE, CAPILLARY      Component Value Range   Glucose-Capillary 129 (*) 70 - 99 (mg/dL)  GLUCOSE, CAPILLARY      Component Value Range   Glucose-Capillary 134 (*) 70 - 99 (mg/dL)   Comment 1 Notify RN    GLUCOSE, CAPILLARY      Component Value Range   Glucose-Capillary 104 (*) 70 - 99 (mg/dL)   Comment 1 Notify RN    GLUCOSE, CAPILLARY      Component Value Range   Glucose-Capillary 119 (*) 70 - 99 (mg/dL)   Comment 1 Notify RN    GLUCOSE, CAPILLARY      Component Value Range   Glucose-Capillary 137 (*) 70 - 99 (mg/dL)     Signed: Darlena Koval NEVILL 04/27/2011, 7:47 AM

## 2011-04-27 NOTE — Progress Notes (Signed)
PT eval not needed per RN communication.  Pt already ready for D/C.  Thanks,  Clovia Cuff, PT

## 2012-02-22 ENCOUNTER — Other Ambulatory Visit: Payer: Self-pay | Admitting: Obstetrics and Gynecology

## 2012-02-22 DIAGNOSIS — Z1231 Encounter for screening mammogram for malignant neoplasm of breast: Secondary | ICD-10-CM

## 2012-03-18 ENCOUNTER — Ambulatory Visit
Admission: RE | Admit: 2012-03-18 | Discharge: 2012-03-18 | Disposition: A | Discharge status: Home / Self Care | Payer: Medicare Other | Source: Ambulatory Visit | Attending: Obstetrics and Gynecology | Admitting: Obstetrics and Gynecology

## 2012-03-18 DIAGNOSIS — Z1231 Encounter for screening mammogram for malignant neoplasm of breast: Secondary | ICD-10-CM

## 2012-09-05 ENCOUNTER — Ambulatory Visit (INDEPENDENT_AMBULATORY_CARE_PROVIDER_SITE_OTHER): Payer: 59 | Admitting: Nurse Practitioner

## 2012-09-05 ENCOUNTER — Encounter: Payer: Self-pay | Admitting: Nurse Practitioner

## 2012-09-05 VITALS — BP 140/80 | HR 49 | Ht 65.0 in | Wt 150.0 lb

## 2012-09-05 DIAGNOSIS — G252 Other specified forms of tremor: Secondary | ICD-10-CM

## 2012-09-05 DIAGNOSIS — G250 Essential tremor: Secondary | ICD-10-CM

## 2012-09-05 DIAGNOSIS — G541 Lumbosacral plexus disorders: Secondary | ICD-10-CM | POA: Insufficient documentation

## 2012-09-05 MED ORDER — PRIMIDONE 50 MG PO TABS: 50.0000 mg | ORAL_TABLET | Freq: Three times a day (TID) | ORAL | Status: DC

## 2012-09-05 NOTE — Patient Instructions (Addendum)
Continue primidone 50 mg 3 times daily Will renew with 3 month prescription with refills Followup yearly and when necessary

## 2012-09-05 NOTE — Progress Notes (Signed)
HPI: Rebecca Flores, 72 year old white female returns for followup for essential tremor.   She has been followed here since June 2009 for tremor felt to represent essential tremor, and previously followed  by Dr. Thad Ranger. Tremor also involves  the head and to  a lesser extent the hands. She has been treated with primidone with improvement,  Her father suffered Parkinson's disease, mother died of Alzheimer's disease, there was no tremors in her siblings, she is now taking primidone 50 mg twice a day, 9am and  9 PM, she reported returning of her tremor at lunch time she denies significant side effects from the medications.    ROS:  Muscle cramps, allergies, dizziness, sleepiness  Physical Exam General: well developed, well nourished, seated, in no evident distress Head: head normocephalic and atraumatic. Oropharynx benign Neck: supple with no carotid  bruits Cardiovascular: regular rate and rhythm, no murmurs  Neurologic Exam Mental Status: Awake and fully alert. Oriented to place and time. Follows all commands. Speech and language normal.   Cranial Nerves: Pupils equal, briskly reactive to light. Extraocular movements full without nystagmus. Visual fields full to confrontation. Hearing intact and symmetric to finger snap. Facial sensation intact. Face, tongue, palate move normally and symmetrically. Neck flexion and extension normal.  Motor: Normal bulk and tone. Normal strength in all tested extremity muscles.No focal weakness. Bilateral mild essential tremor, mild head titubation Coordination: Rapid alternating movements normal in all extremities. Finger-to-nose and heel-to-shin performed accurately bilaterally. No dysmetria Gait and Station: Arises from chair without difficulty. Stance is normal. Gait demonstrates normal stride length and balance . Able to heel, toe and tandem walk without difficulty.  Reflexes: 2+ and symmetric. Toes downgoing.     ASSESSMENT: Essential tremor, symptoms  improved with primidone but complains of recurrent tremor during lunch, will increase the dose to 50 mg three times /day     PLAN: Continue primidone 50 mg 3 times daily Will renew with 3 month prescription with refills Followup yearly and when necessary  Nilda Riggs, GNP-BC APRN

## 2012-12-17 ENCOUNTER — Inpatient Hospital Stay (HOSPITAL_COMMUNITY)
Admission: EM | Admit: 2012-12-17 | Discharge: 2012-12-19 | DRG: 389 | Disposition: A | Discharge status: Home / Self Care | Payer: Medicare Other | Attending: Internal Medicine | Admitting: Internal Medicine

## 2012-12-17 ENCOUNTER — Encounter (HOSPITAL_COMMUNITY): Payer: Self-pay | Admitting: Emergency Medicine

## 2012-12-17 ENCOUNTER — Emergency Department (HOSPITAL_COMMUNITY): Payer: Medicare Other

## 2012-12-17 DIAGNOSIS — E119 Type 2 diabetes mellitus without complications: Secondary | ICD-10-CM

## 2012-12-17 DIAGNOSIS — R112 Nausea with vomiting, unspecified: Secondary | ICD-10-CM

## 2012-12-17 DIAGNOSIS — Z8042 Family history of malignant neoplasm of prostate: Secondary | ICD-10-CM

## 2012-12-17 DIAGNOSIS — N390 Urinary tract infection, site not specified: Secondary | ICD-10-CM | POA: Diagnosis present

## 2012-12-17 DIAGNOSIS — G250 Essential tremor: Secondary | ICD-10-CM

## 2012-12-17 DIAGNOSIS — Z87442 Personal history of urinary calculi: Secondary | ICD-10-CM

## 2012-12-17 DIAGNOSIS — K56609 Unspecified intestinal obstruction, unspecified as to partial versus complete obstruction: Principal | ICD-10-CM | POA: Diagnosis present

## 2012-12-17 DIAGNOSIS — Z87891 Personal history of nicotine dependence: Secondary | ICD-10-CM

## 2012-12-17 DIAGNOSIS — K56600 Partial intestinal obstruction, unspecified as to cause: Secondary | ICD-10-CM

## 2012-12-17 DIAGNOSIS — E871 Hypo-osmolality and hyponatremia: Secondary | ICD-10-CM

## 2012-12-17 DIAGNOSIS — M899 Disorder of bone, unspecified: Secondary | ICD-10-CM | POA: Diagnosis present

## 2012-12-17 DIAGNOSIS — R259 Unspecified abnormal involuntary movements: Secondary | ICD-10-CM | POA: Diagnosis present

## 2012-12-17 DIAGNOSIS — I10 Essential (primary) hypertension: Secondary | ICD-10-CM

## 2012-12-17 DIAGNOSIS — Z833 Family history of diabetes mellitus: Secondary | ICD-10-CM

## 2012-12-17 LAB — URINE MICROSCOPIC-ADD ON

## 2012-12-17 LAB — URINALYSIS, ROUTINE W REFLEX MICROSCOPIC
Bilirubin Urine: NEGATIVE
Hgb urine dipstick: NEGATIVE
Specific Gravity, Urine: 1.016 (ref 1.005–1.030)
Urobilinogen, UA: 0.2 mg/dL (ref 0.0–1.0)

## 2012-12-17 LAB — BASIC METABOLIC PANEL
BUN: 14 mg/dL (ref 6–23)
GFR calc non Af Amer: 85 mL/min — ABNORMAL LOW (ref >90)
Glucose, Bld: 129 mg/dL — ABNORMAL HIGH (ref 70–99)
Potassium: 3.5 mEq/L (ref 3.5–5.1)

## 2012-12-17 LAB — CBC WITH DIFFERENTIAL/PLATELET
Eosinophils Absolute: 0.2 10*3/uL (ref 0.0–0.7)
Eosinophils Relative: 2 % (ref 0–5)
HCT: 35.5 % — ABNORMAL LOW (ref 36.0–46.0)
Hemoglobin: 12.3 g/dL (ref 12.0–15.0)
Lymphs Abs: 2.3 10*3/uL (ref 0.7–4.0)
MCH: 30.2 pg (ref 26.0–34.0)
MCV: 87.2 fL (ref 78.0–100.0)
Monocytes Absolute: 0.5 10*3/uL (ref 0.1–1.0)
Monocytes Relative: 7 % (ref 3–12)
Neutrophils Relative %: 60 % (ref 43–77)
RBC: 4.07 MIL/uL (ref 3.87–5.11)

## 2012-12-17 MED ORDER — KETOROLAC TROMETHAMINE 30 MG/ML IJ SOLN
30.0000 mg | Freq: Once | INTRAMUSCULAR | Status: AC
  Administered 2012-12-17: 30 mg via INTRAVENOUS
  Filled 2012-12-17: qty 1

## 2012-12-17 MED ORDER — ONDANSETRON HCL 4 MG/2ML IJ SOLN
4.0000 mg | Freq: Once | INTRAMUSCULAR | Status: AC
  Administered 2012-12-17: 4 mg via INTRAVENOUS
  Filled 2012-12-17: qty 2

## 2012-12-17 NOTE — ED Notes (Signed)
Pt unable to void at this time. 

## 2012-12-17 NOTE — ED Notes (Signed)
Bed: UJ81 Expected date: 12/17/12 Expected time: 6:44 PM Means of arrival: Ambulance Comments: Kidney Stones

## 2012-12-17 NOTE — H&P (Signed)
PCP:  Pearla Dubonnet, MD  Neurology: Beverely Low  Chief Complaint:  Nausea and vomiting   HPI: Rebecca Flores is a 72 y.o. female   has a past medical history of Diabetes mellitus; Tremors of nervous system; Kidney stone; Hypertension; and Movement disorder.   Presented with  Since this afternoon she started to have abdominal pain and distention followed by nausea and vomiting. She started to have abdominal pain radiating to her back. She had hx of Kidney stones in the past and though it was that. Patient presented to ER and had non contrasted CT of abdomen done showing partial SBO. Gallbladder was removed 15 years ago. She had no prior hx of SBO. Currently patient is doing better. Last BM was around 2 pm and it was very small. She states she has not passed gas since yesterday.  Hospitalist called for admission.   Review of Systems:    Pertinent positives include: abdominal pain, nausea, vomiting,  Constitutional:  No weight loss, night sweats, Fevers, chills, fatigue, weight loss  HEENT:  No headaches, Difficulty swallowing,Tooth/dental problems,Sore throat,  No sneezing, itching, ear ache, nasal congestion, post nasal drip,  Cardio-vascular:  No chest pain, Orthopnea, PND, anasarca, dizziness, palpitations.no Bilateral lower extremity swelling  GI:  No heartburn, indigestion, diarrhea, change in bowel habits, loss of appetite, melena, blood in stool, hematemesis Resp:  no shortness of breath at rest. No dyspnea on exertion, No excess mucus, no productive cough, No non-productive cough, No coughing up of blood.No change in color of mucus.No wheezing. Skin:  no rash or lesions. No jaundice GU:  no dysuria, change in color of urine, no urgency or frequency. No straining to urinate.  No flank pain.  Musculoskeletal:  No joint pain or no joint swelling. No decreased range of motion. No back pain.  Psych:  No change in mood or affect. No depression or anxiety. No memory loss.   Neuro: no localizing neurological complaints, no tingling, no weakness, no double vision, no gait abnormality, no slurred speech, no confusion  Otherwise ROS are negative except for above, 10 systems were reviewed  Past Medical History: Past Medical History  Diagnosis Date  . Diabetes mellitus   . Tremors of nervous system   . Kidney stone   . Hypertension   . Movement disorder    Past Surgical History  Procedure Laterality Date  . Cholecystectomy    . Abdominal hysterectomy       Medications: Prior to Admission medications   Medication Sig Start Date End Date Taking? Authorizing Provider  aspirin 81 MG chewable tablet Chew 81 mg by mouth every Monday, Wednesday, and Friday.    Yes Historical Provider, MD  Cinnamon 500 MG TABS Take 1 tablet by mouth every evening.    Yes Historical Provider, MD  fish oil-omega-3 fatty acids 1000 MG capsule Take 2 g by mouth every evening.    Yes Historical Provider, MD  lisinopril (PRINIVIL,ZESTRIL) 5 MG tablet Take 5 mg by mouth every morning.  08/06/12  Yes Historical Provider, MD  loratadine (CLARITIN) 10 MG tablet Take 10 mg by mouth every morning.   Yes Historical Provider, MD  omeprazole (PRILOSEC) 20 MG capsule Take 20 mg by mouth every morning.   Yes Historical Provider, MD  primidone (MYSOLINE) 50 MG tablet Take 50 mg by mouth 3 (three) times daily.    Yes Historical Provider, MD  Saxagliptin-Metformin (KOMBIGLYZE XR) 2.06-998 MG TB24 Take 1 tablet by mouth every evening.   Yes Historical Provider, MD  Allergies:   Allergies  Allergen Reactions  . Simvastatin     Leg cramps    Social History:  Ambulatory   independently   Lives at  Home alone family lives close by   reports that she quit smoking about 3 years ago. Her smoking use included Cigarettes. She smoked 0.00 packs per day. She has never used smokeless tobacco. She reports that she does not drink alcohol or use illicit drugs.   Family History: family history  includes Breast cancer in her sister; Dementia in her mother and sister; Diabetes Mellitus II in her father; Prostate cancer in her father.    Physical Exam: Patient Vitals for the past 24 hrs:  BP Temp Temp src Resp SpO2  12/17/12 1857 149/83 mmHg 98.3 F (36.8 C) Oral 18 94 %    1. General:  in No Acute distress 2. Psychological: Alert and  Oriented 3. Head/ENT:   Moist  Mucous Membranes                          Head Non traumatic, neck supple                          Normal  Dentition 4. SKIN:   decreased Skin turgor,  Skin clean Dry and intact no rash 5. Heart: Regular rate and rhythm no Murmur, Rub or gallop 6. Lungs: Clear to auscultation bilaterally, no wheezes or crackles   7. Abdomen: generalized  tender,   distended 8. Lower extremities: no clubbing, cyanosis, or edema 9. Neurologically Grossly intact, moving all 4 extremities equally 10. MSK: Normal range of motion  body mass index is unknown because there is no weight on file.   Labs on Admission:   Recent Labs  12/17/12 1930  NA 131*  K 3.5  CL 97  CO2 23  GLUCOSE 129*  BUN 14  CREATININE 0.68  CALCIUM 9.7   No results found for this basename: AST, ALT, ALKPHOS, BILITOT, PROT, ALBUMIN,  in the last 72 hours No results found for this basename: LIPASE, AMYLASE,  in the last 72 hours  Recent Labs  12/17/12 1930  WBC 7.7  NEUTROABS 4.6  HGB 12.3  HCT 35.5*  MCV 87.2  PLT 195   No results found for this basename: CKTOTAL, CKMB, CKMBINDEX, TROPONINI,  in the last 72 hours No results found for this basename: TSH, T4TOTAL, FREET3, T3FREE, THYROIDAB,  in the last 72 hours No results found for this basename: VITAMINB12, FOLATE, FERRITIN, TIBC, IRON, RETICCTPCT,  in the last 72 hours Lab Results  Component Value Date   HGBA1C 6.8* 04/24/2011    The CrCl is unknown because both a height and weight (above a minimum accepted value) are required for this calculation. ABG    Component Value Date/Time   HCO3  24.2* 02/18/2007 0659   TCO2 25 02/18/2007 0659     No results found for this basename: DDIMER    UA 3-6 WBC but asymptomatic   Cultures:    Component Value Date/Time   SDES URINE, CLEAN CATCH 12/13/2010 1418   SPECREQUEST NONE 12/13/2010 1418   CULT NO GROWTH 12/13/2010 1418   REPTSTATUS 12/14/2010 FINAL 12/13/2010 1418       Radiological Exams on Admission: Ct Abdomen Pelvis Wo Contrast  12/17/2012   CLINICAL DATA:  Bilateral flank pain, history of kidney stones  EXAM: CT ABDOMEN AND PELVIS WITHOUT CONTRAST  TECHNIQUE: Multidetector  CT imaging of the abdomen and pelvis was performed following the standard protocol without intravenous contrast.  COMPARISON:  12/13/2010  FINDINGS: Lung bases are unremarkable. Sagittal images of the spine shows osteopenia and degenerative changes lumbar spine. There is probable Schmorl's node deformity upper endplate of L1 vertebral body.  Unenhanced liver shows no biliary ductal dilatation. The patient is status postcholecystectomy. Unenhanced spleen, pancreas and right adrenal are stable. Left adrenal adenoma is stable in appearance. Unenhanced kidneys are symmetrical in size. A cyst posterior aspect of the right kidney is stable in size in appearance from prior exam. Atherosclerotic calcifications of abdominal aorta, SMA and iliac arteries. No aortic aneurysm.  No nephrolithiasis. No calcified ureteral calculi are noted bilaterally.  There are distended distal small bowel loops with fecal like material. This is best seen in axial images 58 59. This is confirmed in coronal image 44. Findings are highly suspicious for at least partial small bowel obstruction. The terminal ileum is somewhat small in caliber with intraluminal gas. Moderate stool noted within right colon and cecum. No pericecal inflammation.  The sigmoid colon is empty partially collapsed. Sigmoid colon diverticula are noted without evidence of acute diverticulitis. Mild distended urinary  bladder. No calcified calculi are noted within urinary bladder. The patient is status post hysterectomy. Bilateral inguinal carinal small hernia containing fat is noted without evidence of acute complication.  IMPRESSION: 1. No nephrolithiasis. No hydronephrosis or hydroureter. No calcified ureteral calculi are noted. 2. There are distended distal small bowel loops with fecal like material. This is best seen in axial images 58 59. This is confirmed in coronal image 44. Findings are highly suspicious for at least partial small bowel obstruction. Clinical correlation is necessary. 3. Sigmoid colon diverticula are noted. No evidence of acute diverticulitis. 4. Status post hysterectomy. 5. Mild distended urinary bladder without evidence of calcified calculi. 6. Bilateral inguinal carinal small hernia containing fat without evidence of acute complication.   Electronically Signed   By: [name] M.D.   On: 12/17/2012 20:12    Chart has been reviewed  Assessment/Plan  72 yo female with SBO now improving slowing with conservative approach  Present on Admission:  . Small bowel obstruction - conservative management for now. Her pain and nausea improved will hold off on NG tube. If no significant improvement over time will need surgical consult. Marland Kitchen HTN (hypertension) - hydralazine PRN . DM (diabetes mellitus) -SSI and hold po meds . Hyponatremia - mild likley due to Poor PO intake vomiting and mild dehydration. Will give IV fluids, check urine electrolytes Tremor - will continue home medication if she is able to tolerate.  Prophylaxis: SCD  Protonix  CODE STATUS: FULL CODE  Other plan as per orders.  I have spent a total of 55 min on this admission  Philipe Laswell 12/17/2012, 9:52 PM

## 2012-12-17 NOTE — ED Notes (Addendum)
Patient from home. Complaining of bilateral flank pain. Has hx of kidney stones. Patient states that pain is like the last time she had stones. 141/81, 58, cbg 136

## 2012-12-17 NOTE — ED Provider Notes (Signed)
CSN: [id number]     Arrival date & time 12/17/12  1850 History   First MD Initiated Contact with Patient 12/17/12 1901     Chief Complaint  Patient presents with  . Flank Pain   (Consider location/radiation/quality/duration/timing/severity/associated sxs/prior Treatment) The history is provided by the patient and medical records.   This is a 72 year old female with past history significant for CNS tremors, kidney stones, hypertension, diabetes, presenting to the ED for bilateral flank pain, nausea, and 5-6 episodes of emesis, onset this morning. Patient states the symptoms are identical to her previous bouts with kidney stones. She denies any urinary symptoms. No fevers, sweats, or chills.  Her last episode of kidney stones was approximately one year ago, passed spontaneously.  Pt does not that her abdomen feels mildly distended, BM earlier today which was small.  Has not been passing flatus.  Prior cholecystectomy and hysterectomy.  VS stable on arrival.  Past Medical History  Diagnosis Date  . Diabetes mellitus   . Tremors of nervous system   . Kidney stone   . Hypertension   . Movement disorder    Past Surgical History  Procedure Laterality Date  . Cholecystectomy    . Abdominal hysterectomy     No family history on file. History  Substance Use Topics  . Smoking status: Former Smoker    Types: Cigarettes    Quit date: 04/23/2009  . Smokeless tobacco: Never Used  . Alcohol Use: No   OB History   Grav Para Term Preterm Abortions TAB SAB Ect Mult Living                 Review of Systems  Gastrointestinal: Positive for nausea and vomiting.  Genitourinary: Positive for flank pain.  All other systems reviewed and are negative.    Allergies  Review of patient's allergies indicates no known allergies.  Home Medications   Current Outpatient Rx  Name  Route  Sig  Dispense  Refill  . aspirin 81 MG chewable tablet   Oral   Chew 81 mg by mouth every other day.          . Cinnamon 500 MG TABS   Oral   Take 1 tablet by mouth daily.         . fish oil-omega-3 fatty acids 1000 MG capsule   Oral   Take 1 g by mouth daily.         Marland Kitchen lisinopril (PRINIVIL,ZESTRIL) 5 MG tablet      3 (three) times daily.         . metFORMIN (GLUCOPHAGE) 500 MG tablet   Oral   Take 500 mg by mouth 2 (two) times daily with a meal.         . nystatin-triamcinolone (MYCOLOG II) cream   Topical   Apply 1 application topically 2 (two) times daily as needed.         . primidone (MYSOLINE) 50 MG tablet   Oral   Take 1 tablet (50 mg total) by mouth 3 (three) times daily.   270 tablet   3   . TRUETRACK TEST test strip                BP 149/83  Temp(Src) 98.3 F (36.8 C) (Oral)  Resp 18  SpO2 94%  Physical Exam  Nursing note and vitals reviewed. Constitutional: She is oriented to person, place, and time. She appears well-developed and well-nourished. No distress.  Appears uncomfortable but not distressed  HENT:  Head: Normocephalic and atraumatic.  Mouth/Throat: Oropharynx is clear and moist.  Eyes: Conjunctivae and EOM are normal. Pupils are equal, round, and reactive to light.  Neck: Normal range of motion. Neck supple.  Cardiovascular: Normal rate, regular rhythm and normal heart sounds.   Pulmonary/Chest: Effort normal and breath sounds normal. No respiratory distress. She has no wheezes.  Abdominal: She exhibits distension. There is tenderness. There is CVA tenderness.  Bilateral flank pain with radiation to the groin; mild TTP of bilateral lower quadrants; mild distention  Musculoskeletal: Normal range of motion.  Neurological: She is alert and oriented to person, place, and time.  Skin: Skin is warm and dry. She is not diaphoretic.  Psychiatric: She has a normal mood and affect.    ED Course  Procedures (including critical care time) Labs Review Labs Reviewed  CBC WITH DIFFERENTIAL  BASIC METABOLIC PANEL  URINALYSIS, ROUTINE W REFLEX  MICROSCOPIC   Imaging Review Ct Abdomen Pelvis Wo Contrast  12/17/2012   CLINICAL DATA:  Bilateral flank pain, history of kidney stones  EXAM: CT ABDOMEN AND PELVIS WITHOUT CONTRAST  TECHNIQUE: Multidetector CT imaging of the abdomen and pelvis was performed following the standard protocol without intravenous contrast.  COMPARISON:  12/13/2010  FINDINGS: Lung bases are unremarkable. Sagittal images of the spine shows osteopenia and degenerative changes lumbar spine. There is probable Schmorl's node deformity upper endplate of L1 vertebral body.  Unenhanced liver shows no biliary ductal dilatation. The patient is status postcholecystectomy. Unenhanced spleen, pancreas and right adrenal are stable. Left adrenal adenoma is stable in appearance. Unenhanced kidneys are symmetrical in size. A cyst posterior aspect of the right kidney is stable in size in appearance from prior exam. Atherosclerotic calcifications of abdominal aorta, SMA and iliac arteries. No aortic aneurysm.  No nephrolithiasis. No calcified ureteral calculi are noted bilaterally.  There are distended distal small bowel loops with fecal like material. This is best seen in axial images 58 59. This is confirmed in coronal image 44. Findings are highly suspicious for at least partial small bowel obstruction. The terminal ileum is somewhat small in caliber with intraluminal gas. Moderate stool noted within right colon and cecum. No pericecal inflammation.  The sigmoid colon is empty partially collapsed. Sigmoid colon diverticula are noted without evidence of acute diverticulitis. Mild distended urinary bladder. No calcified calculi are noted within urinary bladder. The patient is status post hysterectomy. Bilateral inguinal carinal small hernia containing fat is noted without evidence of acute complication.  IMPRESSION: 1. No nephrolithiasis. No hydronephrosis or hydroureter. No calcified ureteral calculi are noted. 2. There are distended distal small  bowel loops with fecal like material. This is best seen in axial images 58 59. This is confirmed in coronal image 44. Findings are highly suspicious for at least partial small bowel obstruction. Clinical correlation is necessary. 3. Sigmoid colon diverticula are noted. No evidence of acute diverticulitis. 4. Status post hysterectomy. 5. Mild distended urinary bladder without evidence of calcified calculi. 6. Bilateral inguinal carinal small hernia containing fat without evidence of acute complication.   Electronically Signed   By: [name] M.D.   On: 12/17/2012 20:12    EKG Interpretation   None       MDM   1. Partial small bowel obstruction   2. Nausea & vomiting   3. HTN (hypertension)   4. DM (diabetes mellitus)   5. Essential and other specified forms of tremor    CT negative for stones but revealing partial SBO.  Labs  as above.  Pt has had no further episodes of emesis in the ED and pain is controlled but will need hospital admission for monitoring of sx.  Consulted hospitalist, Dr. Adela Glimpse-- she will see pt in the ED and admit.  Garlon Hatchet, PA-C 12/18/12 [id number]

## 2012-12-17 NOTE — ED Provider Notes (Signed)
Medical screening examination/treatment/procedure(s) were conducted as a shared visit with non-physician practitioner(s) and myself.  I personally evaluated the patient during the encounter.  EKG Interpretation   None      Patient seen and examined. No evidence of surgical abdomen at this time. Will require admission to medicine for early partial small bowel obstruction  Toy Baker, MD 12/17/12 2037

## 2012-12-18 ENCOUNTER — Inpatient Hospital Stay (HOSPITAL_COMMUNITY): Payer: Medicare Other

## 2012-12-18 LAB — COMPREHENSIVE METABOLIC PANEL
ALT: 12 U/L (ref 0–35)
AST: 22 U/L (ref 0–37)
Albumin: 3.3 g/dL — ABNORMAL LOW (ref 3.5–5.2)
Calcium: 9.2 mg/dL (ref 8.4–10.5)
Chloride: 105 mEq/L (ref 96–112)
Creatinine, Ser: 0.9 mg/dL (ref 0.50–1.10)
GFR calc non Af Amer: 62 mL/min — ABNORMAL LOW (ref >90)
Potassium: 3.8 mEq/L (ref 3.5–5.1)
Total Bilirubin: 0.5 mg/dL (ref 0.3–1.2)
Total Protein: 5.6 g/dL — ABNORMAL LOW (ref 6.0–8.3)

## 2012-12-18 LAB — GLUCOSE, CAPILLARY
Glucose-Capillary: 108 mg/dL — ABNORMAL HIGH (ref 70–99)
Glucose-Capillary: 109 mg/dL — ABNORMAL HIGH (ref 70–99)
Glucose-Capillary: 115 mg/dL — ABNORMAL HIGH (ref 70–99)
Glucose-Capillary: 116 mg/dL — ABNORMAL HIGH (ref 70–99)
Glucose-Capillary: 89 mg/dL (ref 70–99)

## 2012-12-18 LAB — TSH: TSH: 4.103 u[IU]/mL (ref 0.350–4.500)

## 2012-12-18 LAB — URINALYSIS, ROUTINE W REFLEX MICROSCOPIC
Ketones, ur: NEGATIVE mg/dL
Nitrite: NEGATIVE
Protein, ur: NEGATIVE mg/dL
pH: 5 (ref 5.0–8.0)

## 2012-12-18 LAB — SODIUM, URINE, RANDOM: Sodium, Ur: 58 mEq/L

## 2012-12-18 LAB — CBC
HCT: 32.3 % — ABNORMAL LOW (ref 36.0–46.0)
Hemoglobin: 11 g/dL — ABNORMAL LOW (ref 12.0–15.0)
MCH: 30.1 pg (ref 26.0–34.0)
MCV: 88.5 fL (ref 78.0–100.0)
RBC: 3.65 MIL/uL — ABNORMAL LOW (ref 3.87–5.11)
RDW: 12.3 % (ref 11.5–15.5)

## 2012-12-18 LAB — PHOSPHORUS: Phosphorus: 4.2 mg/dL (ref 2.3–4.6)

## 2012-12-18 LAB — URINE MICROSCOPIC-ADD ON

## 2012-12-18 LAB — HEMOGLOBIN A1C
Hgb A1c MFr Bld: 6.7 % — ABNORMAL HIGH (ref <5.7)
Mean Plasma Glucose: 146 mg/dL — ABNORMAL HIGH (ref <117)

## 2012-12-18 MED ORDER — PRIMIDONE 50 MG PO TABS
50.0000 mg | ORAL_TABLET | Freq: Three times a day (TID) | ORAL | Status: DC
  Administered 2012-12-18 – 2012-12-19 (×4): 50 mg via ORAL
  Filled 2012-12-18 (×6): qty 1

## 2012-12-18 MED ORDER — SODIUM CHLORIDE 0.9 % IV SOLN
INTRAVENOUS | Status: AC
  Administered 2012-12-18: 01:00:00 via INTRAVENOUS

## 2012-12-18 MED ORDER — SODIUM CHLORIDE 0.9 % IV SOLN: INTRAVENOUS | Status: AC

## 2012-12-18 MED ORDER — HYDRALAZINE HCL 20 MG/ML IJ SOLN: 10.0000 mg | INTRAMUSCULAR | Status: DC | PRN

## 2012-12-18 MED ORDER — INSULIN ASPART 100 UNIT/ML ~~LOC~~ SOLN: 0.0000 [IU] | SUBCUTANEOUS | Status: DC

## 2012-12-18 MED ORDER — PANTOPRAZOLE SODIUM 40 MG IV SOLR
40.0000 mg | Freq: Every day | INTRAVENOUS | Status: DC
  Administered 2012-12-18 (×2): 40 mg via INTRAVENOUS
  Filled 2012-12-18 (×3): qty 40

## 2012-12-18 MED ORDER — ONDANSETRON HCL 4 MG PO TABS: 4.0000 mg | ORAL_TABLET | Freq: Four times a day (QID) | ORAL | Status: DC | PRN

## 2012-12-18 MED ORDER — ONDANSETRON HCL 4 MG/2ML IJ SOLN: 4.0000 mg | Freq: Four times a day (QID) | INTRAMUSCULAR | Status: DC | PRN

## 2012-12-18 MED ORDER — HYDROMORPHONE HCL PF 1 MG/ML IJ SOLN: 0.5000 mg | INTRAMUSCULAR | Status: DC | PRN

## 2012-12-18 MED ORDER — PSYLLIUM 95 % PO PACK
1.0000 | PACK | Freq: Every day | ORAL | Status: DC
  Administered 2012-12-18: 1 via ORAL
  Filled 2012-12-18 (×2): qty 1

## 2012-12-18 MED ORDER — DEXTROSE 5 % IV SOLN
1.0000 g | Freq: Every day | INTRAVENOUS | Status: DC
  Administered 2012-12-18 – 2012-12-19 (×2): 1 g via INTRAVENOUS
  Filled 2012-12-18 (×2): qty 10

## 2012-12-18 MED ORDER — MORPHINE SULFATE 2 MG/ML IJ SOLN
2.0000 mg | INTRAMUSCULAR | Status: DC | PRN
  Administered 2012-12-18: 2 mg via INTRAVENOUS
  Filled 2012-12-18: qty 1

## 2012-12-18 NOTE — ED Provider Notes (Signed)
Medical screening examination/treatment/procedure(s) were performed by non-physician practitioner and as supervising physician I was immediately available for consultation/collaboration.   Ravleen Ries T Nayden Czajka, MD 12/18/12 2319 

## 2012-12-18 NOTE — Progress Notes (Signed)
Pt refuses to wear SCDs'

## 2012-12-18 NOTE — Progress Notes (Signed)
Subjective: Some Abdominal pain No N/V Objective: Vital signs in last 24 hours: Temp:  [97.6 F (36.4 C)-98.4 F (36.9 C)] 97.6 F (36.4 C) (10/26 0545) Pulse Rate:  [48-55] 48 (10/26 0545) Resp:  [16-18] 18 (10/26 0545) BP: (121-175)/(59-83) 121/63 mmHg (10/26 0545) SpO2:  [94 %-96 %] 94 % (10/26 0545) Weight:  [66.7 kg (147 lb 0.8 oz)] 66.7 kg (147 lb 0.8 oz) (10/25 2353) Weight change:     Intake/Output from previous day: 10/25 0701 - 10/26 0700 In: -  Out: 400 [Urine:400] Intake/Output this shift:    General appearance: alert Resp: clear to auscultation bilaterally Cardio: regular rate and rhythm GI: soft tender on left side; BS few  Lab Results:  Recent Labs  12/17/12 1930 12/18/12 0525  WBC 7.7 6.1  HGB 12.3 11.0*  HCT 35.5* 32.3*  PLT 195 175   BMET  Recent Labs  12/17/12 1930 12/18/12 0525  NA 131* 136  K 3.5 3.8  CL 97 105  CO2 23 24  GLUCOSE 129* 112*  BUN 14 15  CREATININE 0.68 0.90  CALCIUM 9.7 9.2    Studies/Results: Ct Abdomen Pelvis Wo Contrast  12/17/2012   CLINICAL DATA:  Bilateral flank pain, history of kidney stones  EXAM: CT ABDOMEN AND PELVIS WITHOUT CONTRAST  TECHNIQUE: Multidetector CT imaging of the abdomen and pelvis was performed following the standard protocol without intravenous contrast.  COMPARISON:  12/13/2010  FINDINGS: Lung bases are unremarkable. Sagittal images of the spine shows osteopenia and degenerative changes lumbar spine. There is probable Schmorl's node deformity upper endplate of L1 vertebral body.  Unenhanced liver shows no biliary ductal dilatation. The patient is status postcholecystectomy. Unenhanced spleen, pancreas and right adrenal are stable. Left adrenal adenoma is stable in appearance. Unenhanced kidneys are symmetrical in size. A cyst posterior aspect of the right kidney is stable in size in appearance from prior exam. Atherosclerotic calcifications of abdominal aorta, SMA and iliac arteries. No aortic  aneurysm.  No nephrolithiasis. No calcified ureteral calculi are noted bilaterally.  There are distended distal small bowel loops with fecal like material. This is best seen in axial images 58 59. This is confirmed in coronal image 44. Findings are highly suspicious for at least partial small bowel obstruction. The terminal ileum is somewhat small in caliber with intraluminal gas. Moderate stool noted within right colon and cecum. No pericecal inflammation.  The sigmoid colon is empty partially collapsed. Sigmoid colon diverticula are noted without evidence of acute diverticulitis. Mild distended urinary bladder. No calcified calculi are noted within urinary bladder. The patient is status post hysterectomy. Bilateral inguinal carinal small hernia containing fat is noted without evidence of acute complication.  IMPRESSION: 1. No nephrolithiasis. No hydronephrosis or hydroureter. No calcified ureteral calculi are noted. 2. There are distended distal small bowel loops with fecal like material. This is best seen in axial images 58 59. This is confirmed in coronal image 44. Findings are highly suspicious for at least partial small bowel obstruction. Clinical correlation is necessary. 3. Sigmoid colon diverticula are noted. No evidence of acute diverticulitis. 4. Status post hysterectomy. 5. Mild distended urinary bladder without evidence of calcified calculi. 6. Bilateral inguinal carinal small hernia containing fat without evidence of acute complication.   Electronically Signed   By: [name] M.D.   On: 12/17/2012 20:12    Medications: I have reviewed the patient's current medications.  Assessment/Plan: Abdominal Pain/ h/o Abdominal surgery/ IBS- P SBO- no N/V Keep NPO with some Ice chips  KUB IVF- if worse consider Surgergy / NG tube HTN- BP stable DM- SS insulin   LOS: 1 day   Rebecca Flores 12/18/2012, 8:06 AM

## 2012-12-18 NOTE — Plan of Care (Signed)
Problem: Phase I Progression Outcomes Goal: OOB as tolerated unless otherwise ordered Outcome: Progressing Ambulating in the halls     

## 2012-12-19 ENCOUNTER — Inpatient Hospital Stay (HOSPITAL_COMMUNITY): Payer: Medicare Other

## 2012-12-19 LAB — CBC
Hemoglobin: 10.8 g/dL — ABNORMAL LOW (ref 12.0–15.0)
MCHC: 33.6 g/dL (ref 30.0–36.0)
MCV: 88.7 fL (ref 78.0–100.0)
Platelets: 162 10*3/uL (ref 150–400)
RDW: 12.5 % (ref 11.5–15.5)
WBC: 4.9 10*3/uL (ref 4.0–10.5)

## 2012-12-19 LAB — GLUCOSE, CAPILLARY
Glucose-Capillary: 97 mg/dL (ref 70–99)
Glucose-Capillary: 109 mg/dL — ABNORMAL HIGH (ref 70–99)
Glucose-Capillary: 104 mg/dL — ABNORMAL HIGH (ref 70–99)

## 2012-12-19 MED ORDER — CEPHALEXIN 500 MG PO CAPS: 500.0000 mg | ORAL_CAPSULE | Freq: Two times a day (BID) | ORAL | Status: DC

## 2012-12-19 MED ORDER — SODIUM CHLORIDE 0.9 % IV SOLN: INTRAVENOUS | Status: DC

## 2012-12-19 NOTE — Discharge Summary (Signed)
Physician Discharge Summary  NAME:Rebecca Flores  ZOX:[id number]  DOB: [birth year]   Admit date: 12/17/2012 Discharge date: 12/19/2012  Discharge Diagnoses:   . Small bowel obstruction - tolerating her liquids this morning and if she tolerates full liquids this afternoon discharge home late afternoon - low residue diet on discharge if tolerates full liquid diet today at lunch  . HTN (hypertension) - resume usual home medicines  . DM (diabetes mellitus) - blood sugars under good control.  Resume usual home medicines  . Hyponatremia - resolved with IV fluids   Tremor - continue home medication   Possible UTI - followup on culture and discharge home on cephalexin   Discharge Physical Exam:  General Appearance: Alert, cooperative, no distress, appears stated age  Weight change:   Intake/Output Summary (Last 24 hours) at 12/19/12 0815 Last data filed at 12/18/12 2141  Gross per 24 hour  Intake   1503 ml  Output      0 ml  Net   1503 ml   Filed Vitals:   12/18/12 0545 12/18/12 1459 12/18/12 2014 12/19/12 0423  BP: 121/63 158/66 147/54 128/53  Pulse: 48 47 49 44  Temp: 97.6 F (36.4 C) 97.3 F (36.3 C) 97.6 F (36.4 C) 98.1 F (36.7 C)  TempSrc: Oral Oral Oral Oral  Resp: 18 18 18 18   Height:      Weight:      SpO2: 94% 98% 96% 96%    Lungs: Clear to auscultation bilaterally, respirations unlabored Heart: Regular rate and rhythm, S1 and S2 normal, no murmur, rub or gallop Abdomen: Soft, non-tender, bowel sounds active all four quadrants, no masses, no organomegaly Extremities: Extremities normal, atraumatic, no cyanosis or edema Neuro: Alert and oriented.  Nonfocal except for chronic tremor  Discharge Condition: Improved  Hospital Course: [name] is a very pleasant 72 y.o. female who has a past medical history of Diabetes mellitus; Tremors of nervous system; Kidney stone; Hypertension; and Movement disorder.  She presented to the [facility] long emergency room with  abdominal pain and distention followed by nausea and vomiting. She started to have abdominal pain radiating to her back. She had hx of kidney stones in the past and though it was that. Patient presented to ER and had non contrasted CT of abdomen done showing partial SBO. Gallbladder was removed 15 years ago. She had no prior hx of SBO. Currently patient is doing better.  She is now tolerating clear liquids and if she tolerates full liquids at lunch today we will discharge home this afternoon.  She reports taking a large pill over the past few days for her "joint".  She wonders if maybe it did not dissolve properly causing the partial small bowel obstruction. We'll check repeat abdominal x-ray this morning.  Physical exam suggests resolution of small bowel obstruction  Things to follow up in the outpatient setting: Recurrent abdominal distention and pain and nausea and vomiting and also need to followup on urine culture  Consults: Treatment Team:  Marden Noble, MD  Disposition: 01-Home or Self Care  Discharge Orders   Future Appointments Provider Department Dept Phone   09/05/2013 10:00 AM Nilda Riggs, NP [facility] [phone]   Future Orders Complete By Expires   Call MD for:  difficulty breathing, headache or visual disturbances  As directed    Call MD for:  persistant nausea and vomiting  As directed    Call MD for:  severe uncontrolled pain  As directed    Call  MD for:  temperature >100.4  As directed    Diet - low sodium heart healthy  As directed    Increase activity slowly  As directed        Medication List         aspirin 81 MG chewable tablet  Chew 81 mg by mouth every Monday, Wednesday, and Friday.     cephALEXin 500 MG capsule  Commonly known as:  KEFLEX  Take 1 capsule (500 mg total) by mouth 2 (two) times daily.     Cinnamon 500 MG Tabs  Take 1 tablet by mouth every evening.     fish oil-omega-3 fatty acids 1000 MG capsule  Take 2 g by  mouth every evening.     KOMBIGLYZE XR 2.06-998 MG Tb24  Generic drug:  Saxagliptin-Metformin  Take 1 tablet by mouth every evening.     lisinopril 5 MG tablet  Commonly known as:  PRINIVIL,ZESTRIL  Take 5 mg by mouth every morning.     loratadine 10 MG tablet  Commonly known as:  CLARITIN  Take 10 mg by mouth every morning.     omeprazole 20 MG capsule  Commonly known as:  PRILOSEC  Take 20 mg by mouth every morning.     primidone 50 MG tablet  Commonly known as:  MYSOLINE  Take 50 mg by mouth 3 (three) times daily.         The results of significant diagnostics from this hospitalization (including imaging, microbiology, ancillary and laboratory) are listed below for reference.    Significant Diagnostic Studies: Ct Abdomen Pelvis Wo Contrast  12/17/2012   CLINICAL DATA:  Bilateral flank pain, history of kidney stones  EXAM: CT ABDOMEN AND PELVIS WITHOUT CONTRAST  TECHNIQUE: Multidetector CT imaging of the abdomen and pelvis was performed following the standard protocol without intravenous contrast.  COMPARISON:  12/13/2010  FINDINGS: Lung bases are unremarkable. Sagittal images of the spine shows osteopenia and degenerative changes lumbar spine. There is probable Schmorl's node deformity upper endplate of L1 vertebral body.  Unenhanced liver shows no biliary ductal dilatation. The patient is status postcholecystectomy. Unenhanced spleen, pancreas and right adrenal are stable. Left adrenal adenoma is stable in appearance. Unenhanced kidneys are symmetrical in size. A cyst posterior aspect of the right kidney is stable in size in appearance from prior exam. Atherosclerotic calcifications of abdominal aorta, SMA and iliac arteries. No aortic aneurysm.  No nephrolithiasis. No calcified ureteral calculi are noted bilaterally.  There are distended distal small bowel loops with fecal like material. This is best seen in axial images 58 59. This is confirmed in coronal image 44. Findings are  highly suspicious for at least partial small bowel obstruction. The terminal ileum is somewhat small in caliber with intraluminal gas. Moderate stool noted within right colon and cecum. No pericecal inflammation.  The sigmoid colon is empty partially collapsed. Sigmoid colon diverticula are noted without evidence of acute diverticulitis. Mild distended urinary bladder. No calcified calculi are noted within urinary bladder. The patient is status post hysterectomy. Bilateral inguinal carinal small hernia containing fat is noted without evidence of acute complication.  IMPRESSION: 1. No nephrolithiasis. No hydronephrosis or hydroureter. No calcified ureteral calculi are noted. 2. There are distended distal small bowel loops with fecal like material. This is best seen in axial images 58 59. This is confirmed in coronal image 44. Findings are highly suspicious for at least partial small bowel obstruction. Clinical correlation is necessary. 3. Sigmoid colon diverticula are noted. No  evidence of acute diverticulitis. 4. Status post hysterectomy. 5. Mild distended urinary bladder without evidence of calcified calculi. 6. Bilateral inguinal carinal small hernia containing fat without evidence of acute complication.   Electronically Signed   By: [name] M.D.   On: 12/17/2012 20:12   Dg Abd 1 View  12/18/2012   CLINICAL DATA:  Small bowel obstruction with no abdominal complaints today  EXAM: ABDOMEN - 1 VIEW  COMPARISON:  None.  FINDINGS: There are no abnormally dilated loops of bowel. Gas is seen into the rectum. Cholecystectomy clips noted.  IMPRESSION: Nonobstructive bowel gas pattern.   Electronically Signed   By: Esperanza Heir M.D.   On: 12/18/2012 08:41    Microbiology: No results found for this or any previous visit (from the past 240 hour(s)).   Labs: Results for orders placed during the hospital encounter of 12/17/12  CBC WITH DIFFERENTIAL      Result Value Range   WBC 7.7  4.0 - 10.5 K/uL   RBC  4.07  3.87 - 5.11 MIL/uL   Hemoglobin 12.3  12.0 - 15.0 g/dL   HCT 14.7 (*) 82.9 - 56.2 %   MCV 87.2  78.0 - 100.0 fL   MCH 30.2  26.0 - 34.0 pg   MCHC 34.6  30.0 - 36.0 g/dL   RDW 13.0  86.5 - 78.4 %   Platelets 195  150 - 400 K/uL   Neutrophils Relative % 60  43 - 77 %   Neutro Abs 4.6  1.7 - 7.7 K/uL   Lymphocytes Relative 30  12 - 46 %   Lymphs Abs 2.3  0.7 - 4.0 K/uL   Monocytes Relative 7  3 - 12 %   Monocytes Absolute 0.5  0.1 - 1.0 K/uL   Eosinophils Relative 2  0 - 5 %   Eosinophils Absolute 0.2  0.0 - 0.7 K/uL   Basophils Relative 1  0 - 1 %   Basophils Absolute 0.0  0.0 - 0.1 K/uL  BASIC METABOLIC PANEL      Result Value Range   Sodium 131 (*) 135 - 145 mEq/L   Potassium 3.5  3.5 - 5.1 mEq/L   Chloride 97  96 - 112 mEq/L   CO2 23  19 - 32 mEq/L   Glucose, Bld 129 (*) 70 - 99 mg/dL   BUN 14  6 - 23 mg/dL   Creatinine, Ser 6.96  0.50 - 1.10 mg/dL   Calcium 9.7  8.4 - 29.5 mg/dL   GFR calc non Af Amer 85 (*) >90 mL/min   GFR calc Af Amer >90  >90 mL/min  URINALYSIS, ROUTINE W REFLEX MICROSCOPIC      Result Value Range   Color, Urine YELLOW  YELLOW   APPearance CLEAR  CLEAR   Specific Gravity, Urine 1.016  1.005 - 1.030   pH 5.5  5.0 - 8.0   Glucose, UA NEGATIVE  NEGATIVE mg/dL   Hgb urine dipstick NEGATIVE  NEGATIVE   Bilirubin Urine NEGATIVE  NEGATIVE   Ketones, ur NEGATIVE  NEGATIVE mg/dL   Protein, ur NEGATIVE  NEGATIVE mg/dL   Urobilinogen, UA 0.2  0.0 - 1.0 mg/dL   Nitrite NEGATIVE  NEGATIVE   Leukocytes, UA MODERATE (*) NEGATIVE  URINE MICROSCOPIC-ADD ON      Result Value Range   Squamous Epithelial / LPF RARE  RARE   WBC, UA 3-6  <3 WBC/hpf   RBC / HPF 0-2  <3 RBC/hpf   Bacteria,  UA RARE  RARE  HEMOGLOBIN A1C      Result Value Range   Hemoglobin A1C 6.7 (*) <5.7 %   Mean Plasma Glucose 146 (*) <117 mg/dL  MAGNESIUM      Result Value Range   Magnesium 1.7  1.5 - 2.5 mg/dL  PHOSPHORUS      Result Value Range   Phosphorus 4.2  2.3 - 4.6 mg/dL   TSH      Result Value Range   TSH 4.103  0.350 - 4.500 uIU/mL  COMPREHENSIVE METABOLIC PANEL      Result Value Range   Sodium 136  135 - 145 mEq/L   Potassium 3.8  3.5 - 5.1 mEq/L   Chloride 105  96 - 112 mEq/L   CO2 24  19 - 32 mEq/L   Glucose, Bld 112 (*) 70 - 99 mg/dL   BUN 15  6 - 23 mg/dL   Creatinine, Ser 1.61  0.50 - 1.10 mg/dL   Calcium 9.2  8.4 - 09.6 mg/dL   Total Protein 5.6 (*) 6.0 - 8.3 g/dL   Albumin 3.3 (*) 3.5 - 5.2 g/dL   AST 22  0 - 37 U/L   ALT 12  0 - 35 U/L   Alkaline Phosphatase 49  39 - 117 U/L   Total Bilirubin 0.5  0.3 - 1.2 mg/dL   GFR calc non Af Amer 62 (*) >90 mL/min   GFR calc Af Amer 72 (*) >90 mL/min  CBC      Result Value Range   WBC 6.1  4.0 - 10.5 K/uL   RBC 3.65 (*) 3.87 - 5.11 MIL/uL   Hemoglobin 11.0 (*) 12.0 - 15.0 g/dL   HCT 04.5 (*) 40.9 - 81.1 %   MCV 88.5  78.0 - 100.0 fL   MCH 30.1  26.0 - 34.0 pg   MCHC 34.1  30.0 - 36.0 g/dL   RDW 91.4  78.2 - 95.6 %   Platelets 175  150 - 400 K/uL  SODIUM, URINE, RANDOM      Result Value Range   Sodium, Ur 58    OSMOLALITY, URINE      Result Value Range   Osmolality, Ur 284 (*) 390 - 1090 mOsm/kg  CREATININE, URINE, RANDOM      Result Value Range   Creatinine, Urine 54.5    GLUCOSE, CAPILLARY      Result Value Range   Glucose-Capillary 108 (*) 70 - 99 mg/dL   Comment 1 Notify RN    GLUCOSE, CAPILLARY      Result Value Range   Glucose-Capillary 109 (*) 70 - 99 mg/dL   Comment 1 Notify RN    URINALYSIS, ROUTINE W REFLEX MICROSCOPIC      Result Value Range   Color, Urine YELLOW  YELLOW   APPearance CLEAR  CLEAR   Specific Gravity, Urine 1.018  1.005 - 1.030   pH 5.0  5.0 - 8.0   Glucose, UA NEGATIVE  NEGATIVE mg/dL   Hgb urine dipstick NEGATIVE  NEGATIVE   Bilirubin Urine NEGATIVE  NEGATIVE   Ketones, ur NEGATIVE  NEGATIVE mg/dL   Protein, ur NEGATIVE  NEGATIVE mg/dL   Urobilinogen, UA 0.2  0.0 - 1.0 mg/dL   Nitrite NEGATIVE  NEGATIVE   Leukocytes, UA MODERATE (*) NEGATIVE   GLUCOSE, CAPILLARY      Result Value Range   Glucose-Capillary 116 (*) 70 - 99 mg/dL  URINE MICROSCOPIC-ADD ON      Result Value Range  Squamous Epithelial / LPF RARE  RARE   WBC, UA 3-6  <3 WBC/hpf  GLUCOSE, CAPILLARY      Result Value Range   Glucose-Capillary 115 (*) 70 - 99 mg/dL  CBC      Result Value Range   WBC 4.9  4.0 - 10.5 K/uL   RBC 3.62 (*) 3.87 - 5.11 MIL/uL   Hemoglobin 10.8 (*) 12.0 - 15.0 g/dL   HCT 09.8 (*) 11.9 - 14.7 %   MCV 88.7  78.0 - 100.0 fL   MCH 29.8  26.0 - 34.0 pg   MCHC 33.6  30.0 - 36.0 g/dL   RDW 82.9  56.2 - 13.0 %   Platelets 162  150 - 400 K/uL  GLUCOSE, CAPILLARY      Result Value Range   Glucose-Capillary 133 (*) 70 - 99 mg/dL  GLUCOSE, CAPILLARY      Result Value Range   Glucose-Capillary 89  70 - 99 mg/dL   Comment 1 Notify RN    GLUCOSE, CAPILLARY      Result Value Range   Glucose-Capillary 97  70 - 99 mg/dL   Comment 1 STAT Lab    GLUCOSE, CAPILLARY      Result Value Range   Glucose-Capillary 109 (*) 70 - 99 mg/dL  GLUCOSE, CAPILLARY      Result Value Range   Glucose-Capillary 115 (*) 70 - 99 mg/dL   Comment 1 Notify RN      Time coordinating discharge: 36 minutes Signed: Pearla Dubonnet, MD 12/19/2012, 8:15 AM

## 2012-12-19 NOTE — Progress Notes (Addendum)
Pt ambulating in the hall with son,tolerating well. Appetite is good, no nausea.Discharge instructions explained, pt states understanding. Prescription given to pt.

## 2012-12-20 LAB — URINE CULTURE

## 2013-02-13 ENCOUNTER — Other Ambulatory Visit: Payer: Self-pay

## 2013-02-13 DIAGNOSIS — Z1231 Encounter for screening mammogram for malignant neoplasm of breast: Secondary | ICD-10-CM

## 2013-03-17 IMAGING — CT CT CERVICAL SPINE W/O CM
3 of 5 series · 9 of 20 positions shown, 10 images · non-contrast
Comparison: 02/18/2007

CT HEAD

CLINICAL DATA: Fall, head injury.  Posterior and laceration.

CT HEAD WITHOUT CONTRAST
CT CERVICAL SPINE WITHOUT CONTRAST
TECHNIQUE: Multidetector CT imaging of the head and cervical spine
was performed following the standard protocol without intravenous
contrast.  Multiplanar CT image reconstructions of the cervical
spine were also generated.

[Series 6: c-spine st · axial · 0.28mm/px · z∈[-240,-184]mm · 2 of 84 slices shown]
[im 28/84  bone]
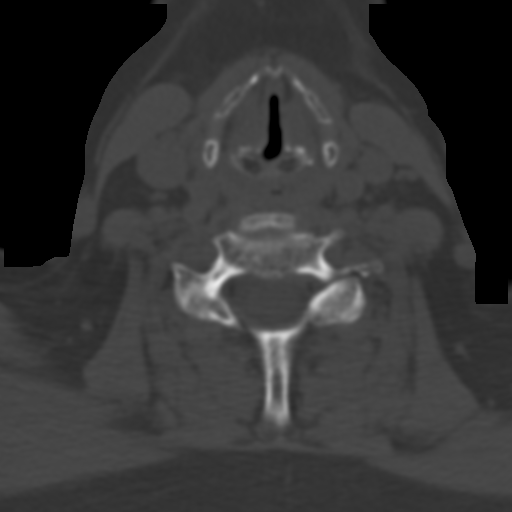
[im 56/84  bone]
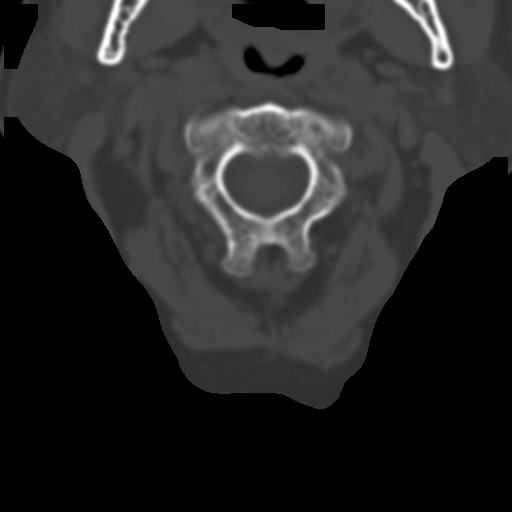

[Series 603: axial reformats · axial · 0.33mm/px · z∈[-313,-205]mm · 4 of 109 slices shown, 5 images]
[im 22/109  soft-tissue]
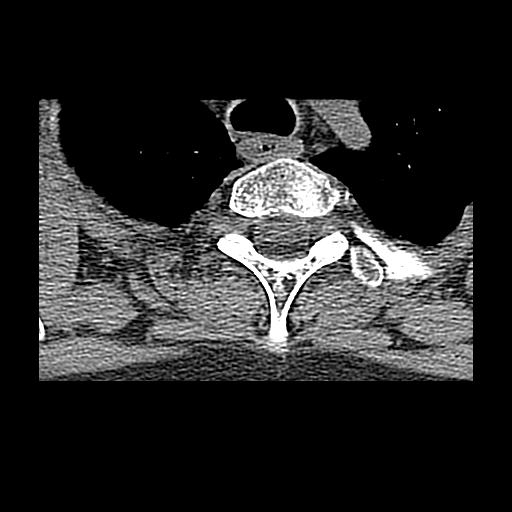
[im 22/109  bone]
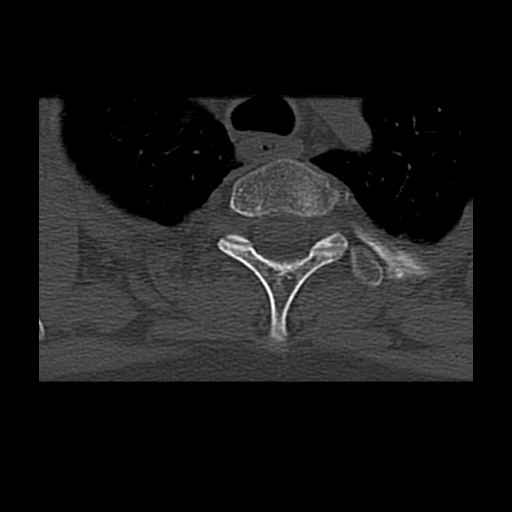
[im 44/109  bone]
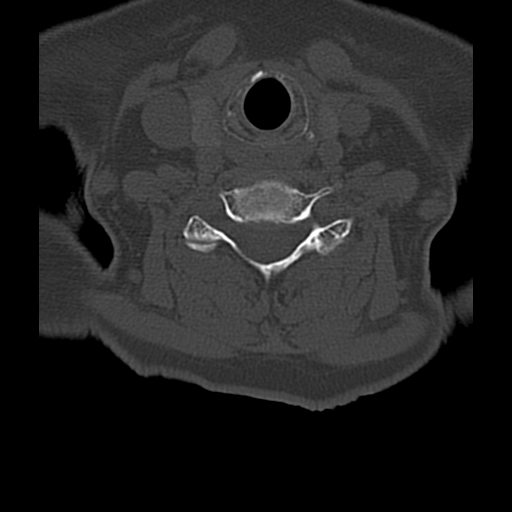
[im 65/109  bone]
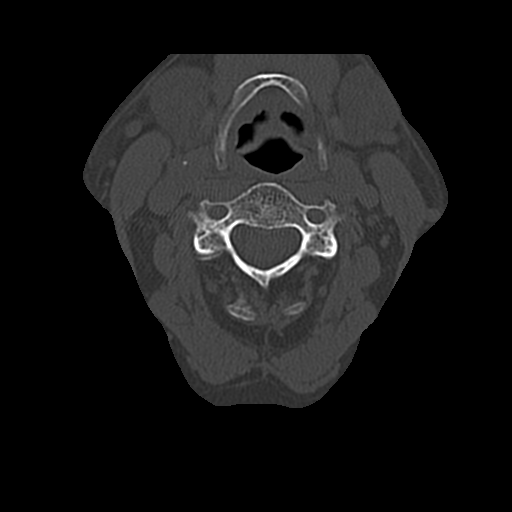
[im 87/109  bone]
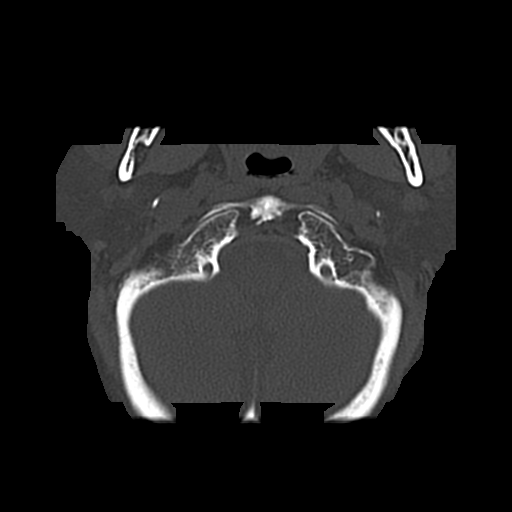

[Series 604: coronal images · coronal · 0.33mm/px · 3 of 45 slices shown]
[im 9/45  bone]
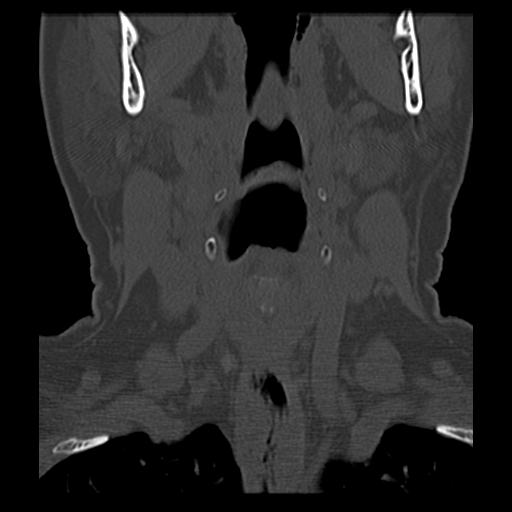
[im 18/45  bone]
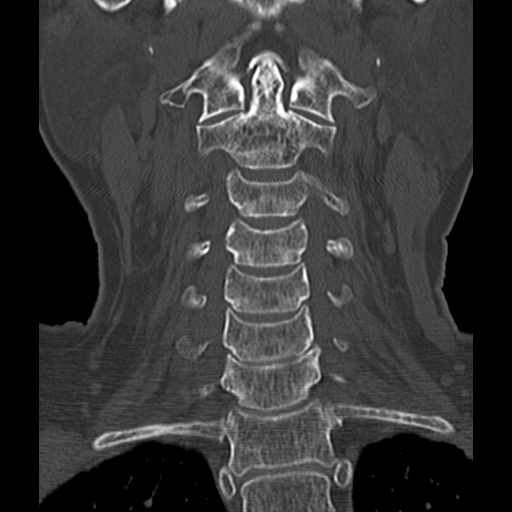
[im 27/45  bone]
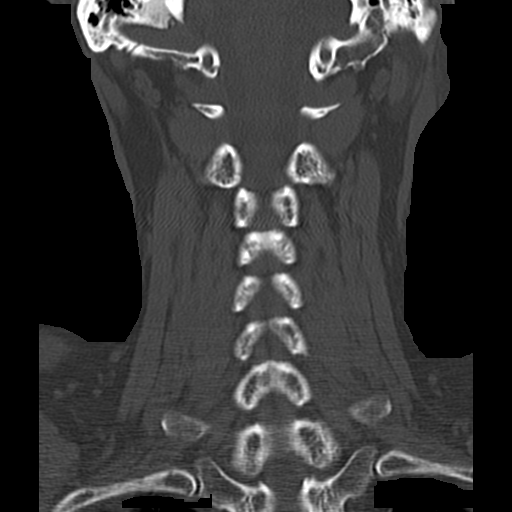

[9 of 20 positions shown; findings below may reference images not displayed]

FINDINGS: Soft tissue swelling over the left posterior scalp.  No
underlying calvarial abnormality.  Mild cerebral atrophy.
Calcification is noted in the frontal lobes bilaterally in the
periventricular white matter, stable since prior study.  No
hemorrhage.  No acute infarction or hydrocephalus.  No acute
calvarial abnormality.

Acute on chronic sinusitis changes.  Small amount of fluid in the
left mastoid air cells.  Right mastoids are clear.
IMPRESSION: No acute intracranial abnormality.

Acute on chronic sinusitis.

Fluid within the left mastoid air cells.

CT CERVICAL SPINE
FINDINGS: Loss of normal cervical lordosis.  Prevertebral soft
tissues are normal.  Degenerative disc disease changes in the lower
cervical spine with this space narrowing and early spurring.  No
fracture.  No epidural or paraspinal hematoma.
IMPRESSION: Cervical straightening.  Early spondylosis.  No acute findings.

## 2013-04-17 ENCOUNTER — Ambulatory Visit: Payer: Medicare Other

## 2013-05-19 ENCOUNTER — Ambulatory Visit
Admission: RE | Admit: 2013-05-19 | Discharge: 2013-05-19 | Disposition: A | Discharge status: Home / Self Care | Payer: Medicare Other | Source: Ambulatory Visit

## 2013-05-19 DIAGNOSIS — Z1231 Encounter for screening mammogram for malignant neoplasm of breast: Secondary | ICD-10-CM

## 2013-05-24 HISTORY — PX: HERNIA REPAIR: SHX51

## 2013-05-30 ENCOUNTER — Ambulatory Visit (INDEPENDENT_AMBULATORY_CARE_PROVIDER_SITE_OTHER): Payer: Medicare Other | Admitting: Surgery

## 2013-05-30 ENCOUNTER — Encounter (INDEPENDENT_AMBULATORY_CARE_PROVIDER_SITE_OTHER): Payer: Self-pay | Admitting: Surgery

## 2013-05-30 DIAGNOSIS — K4090 Unilateral inguinal hernia, without obstruction or gangrene, not specified as recurrent: Secondary | ICD-10-CM | POA: Insufficient documentation

## 2013-05-30 NOTE — Patient Instructions (Signed)
[facility]  HERNIA REPAIR POST OP INSTRUCTIONS  Always review your discharge instruction sheet given to you by the facility where your surgery was performed.  1. A  prescription for pain medication may be given to you upon discharge.  Take your pain medication as prescribed.  If narcotic pain medicine is not needed, then you may take acetaminophen (Tylenol) or ibuprofen (Advil) as needed.  2. Take your usually prescribed medications unless otherwise directed.  3. If you need a refill on your pain medication, please contact your pharmacy.  They will contact our office to request authorization. Prescriptions will not be filled after 5 pm daily or on weekends.  4. You should follow a light diet the first 24 hours after arrival home, such as soup and crackers or toast.  Be sure to include plenty of fluids daily.  Resume your normal diet the day after surgery.  5. Most patients will experience some swelling and bruising around the surgical site.  Ice packs and reclining will help.  Swelling and bruising can take several days to resolve.   6. It is common to experience some constipation if taking pain medication after surgery.  Increasing fluid intake and taking a stool softener (such as Colace) will usually help or prevent this problem from occurring.  A mild laxative (Milk of Magnesia or Miralax) should be taken according to package directions if there are no bowel movements after 48 hours.  7. Unless discharge instructions indicate otherwise, you may remove your bandages 24-48 hours after surgery, and you may shower at that time.  You may have steri-strips (small skin tapes) in place directly over the incision.  These strips should be left on the skin for 7-10 days.  If your surgeon used skin glue on the incision, you may shower in 24 hours.  The glue will flake off over the next 2-3 weeks.  Any sutures or staples will be removed at the office during your follow-up  visit.  8. ACTIVITIES:  You may resume regular (light) daily activities beginning the next day-such as daily self-care, walking, climbing stairs-gradually increasing activities as tolerated.  You may have sexual intercourse when it is comfortable.  Refrain from any heavy lifting or straining until approved by your doctor.  You may drive when you are no longer taking prescription pain medication, you can comfortably wear a seatbelt, and you can safely maneuver your car and apply brakes.  9. You should see your doctor in the office for a follow-up appointment approximately 2-3 weeks after your surgery.  Make sure that you call for this appointment within a day or two after you arrive home to insure a convenient appointment time. 10.   WHEN TO CALL YOUR DOCTOR: 1. Fever greater than 101.0 2. Inability to urinate 3. Persistent nausea and/or vomiting 4. Extreme swelling or bruising 5. Continued bleeding from incision 6. Increased pain, redness, or drainage from the incision  The clinic staff is available to answer your questions during regular business hours.  Please don't hesitate to call and ask to speak to one of the nurses for clinical concerns.  If you have a medical emergency, go to the nearest emergency room or call 911.  A surgeon from [facility] is always on call for the hospital.   [facility] [address], [address], [place],   [address]  [phone] ? [phone] ? FAX [phone]  [email or web address]   

## 2013-05-30 NOTE — Progress Notes (Signed)
General Surgery [facility]  Chief Complaint  Patient presents with  . New Evaluation    evaluate right inguinal hernia - referral from Dr. Newton Pigg    HISTORY: Patient is a 73 year old female known to my practice from previous cholecystectomy. Over the past several months the patient has noted a bulge in the right groin which intermittently causes discomfort. She was seen by her gynecologist and found on physical examination to have a right inguinal hernia. Patient denies any signs or symptoms of obstruction. Pain is positional. Patient has had a prior abdominal hysterectomy. No prior hernia surgery.  Past Medical History  Diagnosis Date  . Diabetes mellitus   . Tremors of nervous system   . Kidney stone   . Hypertension   . Movement disorder     Current Outpatient Prescriptions  Medication Sig Dispense Refill  . ACCU-CHEK SMARTVIEW test strip       . aspirin 81 MG chewable tablet Chew 81 mg by mouth every Monday, Wednesday, and Friday.       . Blood Glucose Monitoring Suppl (ACCU-CHEK NANO SMARTVIEW) W/DEVICE KIT       . Cinnamon 500 MG TABS Take 1 tablet by mouth every evening.       . fish oil-omega-3 fatty acids 1000 MG capsule Take 2 g by mouth every evening.       Marland Kitchen lisinopril (PRINIVIL,ZESTRIL) 5 MG tablet Take 5 mg by mouth every morning.       . loratadine (CLARITIN) 10 MG tablet Take 10 mg by mouth every morning.      Marland Kitchen omeprazole (PRILOSEC) 20 MG capsule Take 20 mg by mouth every morning.      Glory Rosebush DELICA LANCETS 93G MISC       . primidone (MYSOLINE) 50 MG tablet Take 50 mg by mouth 3 (three) times daily.       . Saxagliptin-Metformin (KOMBIGLYZE XR) 2.06-998 MG TB24 Take 1 tablet by mouth every evening.      . solifenacin (VESICARE) 5 MG tablet Take 5 mg by mouth.       No current facility-administered medications for this visit.    Allergies  Allergen Reactions  . Codeine Nausea Only  . Simvastatin     Leg cramps    Family  History  Problem Relation Age of Onset  . Dementia Mother   . Diabetes Mellitus II Father   . Prostate cancer Father   . Breast cancer Sister   . Dementia Sister     History   Social History  . Marital Status: Widowed    Spouse Name: N/A    Number of Children: N/A  . Years of Education: N/A   Social History Main Topics  . Smoking status: Former Smoker    Types: Cigarettes    Quit date: 04/23/2009  . Smokeless tobacco: Never Used  . Alcohol Use: No  . Drug Use: No  . Sexual Activity: No   Other Topics Concern  . None   Social History Narrative  . None    REVIEW OF SYSTEMS - PERTINENT POSITIVES ONLY: Denies signs or symptoms of obstruction. Intermittent discomfort right groin.  EXAM: There were no vitals filed for this visit.  GENERAL: well-developed, well-nourished, no acute distress HEENT: normocephalic; pupils equal and reactive; sclerae clear; dentition good; mucous membranes moist NECK:  symmetric on extension; no palpable anterior or posterior cervical lymphadenopathy; no supraclavicular masses; no tenderness CHEST: clear to auscultation bilaterally without rales, rhonchi, or wheezes  CARDIAC: regular rate and rhythm without significant murmur; peripheral pulses are full ABDOMEN: soft without distension; bowel sounds present; no mass; no hepatosplenomegaly; surgical incisions are well healed GU:  Palpation in the right groin shows an obvious bulge which augments with Valsalva both in a recumbent and a standing position, and is reducible; left groin without palpable abnormality EXT:  non-tender without edema; no deformity NEURO: no gross focal deficits; no sign of tremor   LABORATORY RESULTS: See [facility] (CHL-Epic) for most recent results  RADIOLOGY RESULTS: See [facility] (CHL-Epic) for most recent results  IMPRESSION: Right inguinal hernia, reducible, mildly symptomatic  PLAN: The patient and I discussed the indications for surgical repair.  We discussed the options for management and the use of prosthetic mesh. We discussed performing this as an outpatient surgical procedure. We discussed restrictions on her activities following the procedure. We discussed the possibility of recurrence. Patient understands and wishes to proceed in the near future. We will make arrangements for surgery at a time convenient for the patient.  The risks and benefits of the procedure have been discussed at length with the patient.  The patient understands the proposed procedure, potential alternative treatments, and the course of recovery to be expected.  All of the patient's questions have been answered at this time.  The patient wishes to proceed with surgery.  Earnstine Regal, MD, [facility]  Primary Care Physician: Henrine Screws, MD

## 2013-06-21 ENCOUNTER — Encounter (HOSPITAL_BASED_OUTPATIENT_CLINIC_OR_DEPARTMENT_OTHER): Payer: Self-pay | Admitting: *Deleted

## 2013-06-21 NOTE — Progress Notes (Signed)
Pt will come in for [id number] 

## 2013-06-22 ENCOUNTER — Encounter (HOSPITAL_BASED_OUTPATIENT_CLINIC_OR_DEPARTMENT_OTHER)
Admission: RE | Admit: 2013-06-22 | Discharge: 2013-06-22 | Disposition: A | Discharge status: Home / Self Care | Payer: Medicare Other | Source: Ambulatory Visit | Attending: Surgery | Admitting: Surgery

## 2013-06-22 DIAGNOSIS — Z01810 Encounter for preprocedural cardiovascular examination: Secondary | ICD-10-CM | POA: Insufficient documentation

## 2013-06-22 DIAGNOSIS — Z01812 Encounter for preprocedural laboratory examination: Secondary | ICD-10-CM | POA: Insufficient documentation

## 2013-06-22 LAB — BASIC METABOLIC PANEL
BUN: 17 mg/dL (ref 6–23)
CALCIUM: 9.9 mg/dL (ref 8.4–10.5)
CO2: 23 mEq/L (ref 19–32)
Chloride: 103 mEq/L (ref 96–112)
Creatinine, Ser: 0.7 mg/dL (ref 0.50–1.10)
GFR calc Af Amer: >90 mL/min (ref >90)
GFR, EST NON AFRICAN AMERICAN: 83 mL/min — AB (ref >90)
GLUCOSE: 107 mg/dL — AB (ref 70–99)
Potassium: 5.1 mEq/L (ref 3.7–5.3)
SODIUM: 138 meq/L (ref 137–147)

## 2013-06-22 NOTE — Progress Notes (Signed)
Quick Note:  These results are acceptable for scheduled surgery.  Devona Holmes M. Marva Hendryx, MD, FACS [facility] Office: [phone]   ______ 

## 2013-06-22 NOTE — Progress Notes (Signed)
Quick Note:  EKG is acceptable for scheduled surgery.  Jakera Beaupre M. Lindzy Rupert, MD, FACS [facility] Office: [phone]   ______ 

## 2013-06-27 ENCOUNTER — Ambulatory Visit (HOSPITAL_BASED_OUTPATIENT_CLINIC_OR_DEPARTMENT_OTHER)
Admission: RE | Admit: 2013-06-27 | Discharge: 2013-06-27 | Disposition: A | Discharge status: Home / Self Care | Payer: Medicare Other | Source: Ambulatory Visit | Attending: Surgery | Admitting: Surgery

## 2013-06-27 ENCOUNTER — Encounter (HOSPITAL_BASED_OUTPATIENT_CLINIC_OR_DEPARTMENT_OTHER)
Admission: RE | Disposition: A | Discharge status: Home / Self Care | Payer: Self-pay | Source: Ambulatory Visit | Attending: Surgery

## 2013-06-27 ENCOUNTER — Ambulatory Visit (HOSPITAL_BASED_OUTPATIENT_CLINIC_OR_DEPARTMENT_OTHER): Payer: Medicare Other | Admitting: Anesthesiology

## 2013-06-27 ENCOUNTER — Encounter (HOSPITAL_BASED_OUTPATIENT_CLINIC_OR_DEPARTMENT_OTHER): Payer: Medicare Other | Admitting: Anesthesiology

## 2013-06-27 ENCOUNTER — Telehealth (INDEPENDENT_AMBULATORY_CARE_PROVIDER_SITE_OTHER): Payer: Self-pay

## 2013-06-27 ENCOUNTER — Other Ambulatory Visit (INDEPENDENT_AMBULATORY_CARE_PROVIDER_SITE_OTHER): Payer: Self-pay

## 2013-06-27 ENCOUNTER — Encounter (HOSPITAL_BASED_OUTPATIENT_CLINIC_OR_DEPARTMENT_OTHER): Payer: Self-pay | Admitting: *Deleted

## 2013-06-27 DIAGNOSIS — R259 Unspecified abnormal involuntary movements: Secondary | ICD-10-CM | POA: Insufficient documentation

## 2013-06-27 DIAGNOSIS — E119 Type 2 diabetes mellitus without complications: Secondary | ICD-10-CM | POA: Insufficient documentation

## 2013-06-27 DIAGNOSIS — R110 Nausea: Secondary | ICD-10-CM

## 2013-06-27 DIAGNOSIS — K4090 Unilateral inguinal hernia, without obstruction or gangrene, not specified as recurrent: Secondary | ICD-10-CM | POA: Insufficient documentation

## 2013-06-27 DIAGNOSIS — Z87891 Personal history of nicotine dependence: Secondary | ICD-10-CM | POA: Insufficient documentation

## 2013-06-27 DIAGNOSIS — Z79899 Other long term (current) drug therapy: Secondary | ICD-10-CM | POA: Insufficient documentation

## 2013-06-27 DIAGNOSIS — Z7982 Long term (current) use of aspirin: Secondary | ICD-10-CM | POA: Insufficient documentation

## 2013-06-27 DIAGNOSIS — Z888 Allergy status to other drugs, medicaments and biological substances status: Secondary | ICD-10-CM | POA: Insufficient documentation

## 2013-06-27 DIAGNOSIS — Z885 Allergy status to narcotic agent status: Secondary | ICD-10-CM | POA: Insufficient documentation

## 2013-06-27 DIAGNOSIS — Z87440 Personal history of urinary (tract) infections: Secondary | ICD-10-CM | POA: Insufficient documentation

## 2013-06-27 DIAGNOSIS — I10 Essential (primary) hypertension: Secondary | ICD-10-CM | POA: Insufficient documentation

## 2013-06-27 HISTORY — PX: INSERTION OF MESH: SHX5868

## 2013-06-27 HISTORY — DX: Family history of other specified conditions: Z84.89

## 2013-06-27 HISTORY — PX: INGUINAL HERNIA REPAIR: SHX194

## 2013-06-27 LAB — GLUCOSE, CAPILLARY
Glucose-Capillary: 165 mg/dL — ABNORMAL HIGH (ref 70–99)
Glucose-Capillary: 112 mg/dL — ABNORMAL HIGH (ref 70–99)

## 2013-06-27 LAB — POCT HEMOGLOBIN-HEMACUE: Hemoglobin: 12.4 g/dL (ref 12.0–15.0)

## 2013-06-27 SURGERY — REPAIR, HERNIA, INGUINAL, ADULT
Anesthesia: General | Site: Groin | Laterality: Right

## 2013-06-27 MED ORDER — OXYCODONE HCL 5 MG PO TABS
ORAL_TABLET | ORAL | Status: AC
  Filled 2013-06-27: qty 1

## 2013-06-27 MED ORDER — BUPIVACAINE-EPINEPHRINE (PF) 0.5% -1:200000 IJ SOLN
INTRAMUSCULAR | Status: DC | PRN
  Administered 2013-06-27: 20 mL

## 2013-06-27 MED ORDER — HYDROMORPHONE HCL PF 1 MG/ML IJ SOLN
INTRAMUSCULAR | Status: AC
  Filled 2013-06-27: qty 1

## 2013-06-27 MED ORDER — GLYCOPYRROLATE 0.2 MG/ML IJ SOLN
INTRAMUSCULAR | Status: DC | PRN
  Administered 2013-06-27: 0.2 mg via INTRAVENOUS

## 2013-06-27 MED ORDER — CEFAZOLIN SODIUM-DEXTROSE 2-3 GM-% IV SOLR
INTRAVENOUS | Status: AC
  Filled 2013-06-27: qty 50

## 2013-06-27 MED ORDER — ONDANSETRON HCL 4 MG/2ML IJ SOLN: 4.0000 mg | Freq: Once | INTRAMUSCULAR | Status: DC | PRN

## 2013-06-27 MED ORDER — HYDROCODONE-ACETAMINOPHEN 5-325 MG PO TABS: 1.0000 | ORAL_TABLET | ORAL | Status: DC | PRN

## 2013-06-27 MED ORDER — FENTANYL CITRATE 0.05 MG/ML IJ SOLN
50.0000 ug | INTRAMUSCULAR | Status: DC | PRN
  Administered 2013-06-27: 100 ug via INTRAVENOUS

## 2013-06-27 MED ORDER — OXYCODONE HCL 5 MG PO TABS
5.0000 mg | ORAL_TABLET | Freq: Once | ORAL | Status: AC | PRN
  Administered 2013-06-27: 5 mg via ORAL

## 2013-06-27 MED ORDER — LIDOCAINE HCL (CARDIAC) 20 MG/ML IV SOLN
INTRAVENOUS | Status: DC | PRN
  Administered 2013-06-27: 100 mg via INTRAVENOUS

## 2013-06-27 MED ORDER — ONDANSETRON 8 MG PO TBDP
ORAL_TABLET | ORAL | Status: AC
  Filled 2013-06-27: qty 1

## 2013-06-27 MED ORDER — ONDANSETRON HCL 4 MG/2ML IJ SOLN
INTRAMUSCULAR | Status: DC | PRN
  Administered 2013-06-27: 4 mg via INTRAVENOUS

## 2013-06-27 MED ORDER — FENTANYL CITRATE 0.05 MG/ML IJ SOLN
INTRAMUSCULAR | Status: AC
  Filled 2013-06-27: qty 2

## 2013-06-27 MED ORDER — FENTANYL CITRATE 0.05 MG/ML IJ SOLN
INTRAMUSCULAR | Status: DC | PRN
  Administered 2013-06-27 (×4): 25 ug via INTRAVENOUS

## 2013-06-27 MED ORDER — PROPOFOL 10 MG/ML IV BOLUS
INTRAVENOUS | Status: AC
  Filled 2013-06-27: qty 20

## 2013-06-27 MED ORDER — BUPIVACAINE HCL 0.25 % IJ SOLN
INTRAMUSCULAR | Status: DC | PRN
  Administered 2013-06-27: 20 mL

## 2013-06-27 MED ORDER — ONDANSETRON 4 MG PO TBDP
4.0000 mg | ORAL_TABLET | Freq: Once | ORAL | Status: AC
  Administered 2013-06-27: 4 mg via ORAL

## 2013-06-27 MED ORDER — FENTANYL CITRATE 0.05 MG/ML IJ SOLN
INTRAMUSCULAR | Status: AC
  Filled 2013-06-27: qty 6

## 2013-06-27 MED ORDER — PROPOFOL 10 MG/ML IV BOLUS
INTRAVENOUS | Status: DC | PRN
  Administered 2013-06-27: 150 mg via INTRAVENOUS
  Administered 2013-06-27: 25 mg via INTRAVENOUS

## 2013-06-27 MED ORDER — HYDROMORPHONE HCL PF 1 MG/ML IJ SOLN
0.2500 mg | INTRAMUSCULAR | Status: DC | PRN
  Administered 2013-06-27 (×4): 0.25 mg via INTRAVENOUS

## 2013-06-27 MED ORDER — METOCLOPRAMIDE HCL 5 MG/ML IJ SOLN
INTRAMUSCULAR | Status: DC | PRN
  Administered 2013-06-27: 10 mg via INTRAVENOUS

## 2013-06-27 MED ORDER — OXYCODONE HCL 5 MG/5ML PO SOLN: 5.0000 mg | Freq: Once | ORAL | Status: AC | PRN

## 2013-06-27 MED ORDER — ONDANSETRON 4 MG PO TBDP: 4.0000 mg | ORAL_TABLET | Freq: Three times a day (TID) | ORAL | Status: DC | PRN

## 2013-06-27 MED ORDER — LACTATED RINGERS IV SOLN
INTRAVENOUS | Status: DC
  Administered 2013-06-27 (×2): via INTRAVENOUS

## 2013-06-27 MED ORDER — MIDAZOLAM HCL 2 MG/2ML IJ SOLN
INTRAMUSCULAR | Status: AC
  Filled 2013-06-27: qty 2

## 2013-06-27 MED ORDER — MIDAZOLAM HCL 2 MG/2ML IJ SOLN
1.0000 mg | INTRAMUSCULAR | Status: DC | PRN
Start: 2013-06-27 — End: 2013-06-27

## 2013-06-27 MED ORDER — CEFAZOLIN SODIUM-DEXTROSE 2-3 GM-% IV SOLR
2.0000 g | INTRAVENOUS | Status: AC
  Administered 2013-06-27: 2 g via INTRAVENOUS

## 2013-06-27 SURGICAL SUPPLY — 44 items
BENZOIN TINCTURE PRP APPL 2/3 (GAUZE/BANDAGES/DRESSINGS) ×2 IMPLANT
BLADE 15 SAFETY STRL DISP (BLADE) ×2 IMPLANT
BLADE SURG ROTATE 9660 (MISCELLANEOUS) ×2 IMPLANT
CANISTER SUCT 1200ML W/VALVE (MISCELLANEOUS) ×2 IMPLANT
CHLORAPREP W/TINT 26ML (MISCELLANEOUS) ×2 IMPLANT
CLEANER CAUTERY TIP 5X5 PAD (MISCELLANEOUS) ×1 IMPLANT
COVER MAYO STAND STRL (DRAPES) ×2 IMPLANT
COVER TABLE BACK 60X90 (DRAPES) ×2 IMPLANT
DECANTER SPIKE VIAL GLASS SM (MISCELLANEOUS) IMPLANT
DRAIN PENROSE 1/2X12 LTX STRL (WOUND CARE) ×2 IMPLANT
DRAPE PED LAPAROTOMY (DRAPES) ×2 IMPLANT
DRAPE UTILITY XL STRL (DRAPES) ×2 IMPLANT
ELECT REM PT RETURN 9FT ADLT (ELECTROSURGICAL) ×2
ELECTRODE REM PT RTRN 9FT ADLT (ELECTROSURGICAL) ×1 IMPLANT
GLOVE BIOGEL PI IND STRL 7.0 (GLOVE) ×1 IMPLANT
GLOVE BIOGEL PI INDICATOR 7.0 (GLOVE) ×1
GLOVE ECLIPSE 6.5 STRL STRAW (GLOVE) ×2 IMPLANT
GLOVE EXAM NITRILE MD LF STRL (GLOVE) ×2 IMPLANT
GLOVE SURG ORTHO 8.0 STRL STRW (GLOVE) ×2 IMPLANT
GOWN STRL REUS W/ TWL LRG LVL3 (GOWN DISPOSABLE) ×1 IMPLANT
GOWN STRL REUS W/ TWL XL LVL3 (GOWN DISPOSABLE) ×1 IMPLANT
GOWN STRL REUS W/TWL LRG LVL3 (GOWN DISPOSABLE) ×1
GOWN STRL REUS W/TWL XL LVL3 (GOWN DISPOSABLE) ×1
MESH ULTRAPRO 3X6 7.6X15CM (Mesh General) ×2 IMPLANT
NEEDLE HYPO 25X1 1.5 SAFETY (NEEDLE) ×2 IMPLANT
NS IRRIG 1000ML POUR BTL (IV SOLUTION) ×2 IMPLANT
PACK BASIN DAY SURGERY FS (CUSTOM PROCEDURE TRAY) ×2 IMPLANT
PAD CLEANER CAUTERY TIP 5X5 (MISCELLANEOUS) ×1
PENCIL BUTTON HOLSTER BLD 10FT (ELECTRODE) ×2 IMPLANT
SLEEVE SCD COMPRESS KNEE MED (MISCELLANEOUS) ×2 IMPLANT
SPONGE GAUZE 4X4 12PLY STER LF (GAUZE/BANDAGES/DRESSINGS) ×2 IMPLANT
STRIP CLOSURE SKIN 1/2X4 (GAUZE/BANDAGES/DRESSINGS) ×2 IMPLANT
SUT MNCRL AB 4-0 PS2 18 (SUTURE) ×2 IMPLANT
SUT NOVA 0 T19/GS 22DT (SUTURE) ×2 IMPLANT
SUT NOVA NAB GS-22 2 0 T19 (SUTURE) ×4 IMPLANT
SUT SILK 2 0 SH (SUTURE) ×2 IMPLANT
SUT SILK 2 0 TIES 17X18 (SUTURE)
SUT SILK 2-0 18XBRD TIE BLK (SUTURE) IMPLANT
SUT VICRYL 3-0 CR8 SH (SUTURE) ×2 IMPLANT
SYR CONTROL 10ML LL (SYRINGE) ×2 IMPLANT
TOWEL OR 17X24 6PK STRL BLUE (TOWEL DISPOSABLE) ×4 IMPLANT
TOWEL OR NON WOVEN STRL DISP B (DISPOSABLE) IMPLANT
TUBE CONNECTING 20X1/4 (TUBING) ×2 IMPLANT
YANKAUER SUCT BULB TIP NO VENT (SUCTIONS) ×2 IMPLANT

## 2013-06-27 NOTE — Discharge Instructions (Signed)

## 2013-06-27 NOTE — Anesthesia Preprocedure Evaluation (Signed)
Anesthesia Evaluation  Patient identified by MRN, date of birth, ID band Patient awake    Reviewed: Allergy & Precautions, H&P , NPO status , Patient's Chart, lab work & pertinent test results  Airway Mallampati: I TM Distance: >3 FB Neck ROM: Full    Dental  (+) Teeth Intact, Dental Advisory Given   Pulmonary former smoker,  breath sounds clear to auscultation        Cardiovascular hypertension, Pt. on medications Rhythm:Regular Rate:Normal     Neuro/Psych    GI/Hepatic   Endo/Other  diabetes, Well Controlled, Type 2, Oral Hypoglycemic Agents  Renal/GU      Musculoskeletal   Abdominal   Peds  Hematology   Anesthesia Other Findings   Reproductive/Obstetrics                           Anesthesia Physical Anesthesia Plan  ASA: III  Anesthesia Plan: General   Post-op Pain Management:    Induction: Intravenous  Airway Management Planned: LMA  Additional Equipment:   Intra-op Plan:   Post-operative Plan: Extubation in OR  Informed Consent: I have reviewed the patients History and Physical, chart, labs and discussed the procedure including the risks, benefits and alternatives for the proposed anesthesia with the patient or authorized representative who has indicated his/her understanding and acceptance.   Dental advisory given  Plan Discussed with: CRNA, Anesthesiologist and Surgeon  Anesthesia Plan Comments:         Anesthesia Quick Evaluation

## 2013-06-27 NOTE — Interval H&P Note (Signed)
History and Physical Interval Note:  06/27/2013 10:43 AM  Rebecca Flores  has presented today for surgery, with the diagnosis of right inguinal hernia.  The various methods of treatment have been discussed with the patient and family. After consideration of risks, benefits and other options for treatment, the patient has consented to    Procedure(s): RIGHT INGUINAL HERNIA REPAIRF WITH MESH (Right) INSERTION OF MESH (N/A) as a surgical intervention .    The patient's history has been reviewed, patient examined, no change in status, stable for surgery.  I have reviewed the patient's chart and labs.  Questions were answered to the patient's satisfaction.    Rebecca Hecklerodd M. Jaime Dome, MD, [facility] Office: [phone]    Rebecca Flores

## 2013-06-27 NOTE — Progress Notes (Signed)
Assisted Dr. Crews with right, ultrasound guided, transabdominal plane block. Side rails up, monitors on throughout procedure. See vital signs in flow sheet. Tolerated Procedure well. 

## 2013-06-27 NOTE — Transfer of Care (Signed)
Immediate Anesthesia Transfer of Care Note  Patient: Clayton BiblesChristine Hofmeister  Procedure(s) Performed: Procedure(s): RIGHT INGUINAL HERNIA REPAIR WITH MESH (Right) INSERTION OF MESH (Right)  Patient Location: PACU  Anesthesia Type:GA combined with regional for post-op pain  Level of Consciousness: awake, oriented and patient cooperative  Airway & Oxygen Therapy: Patient Spontanous Breathing and Patient connected to face mask oxygen  Post-op Assessment: Report given to PACU RN and Post -op Vital signs reviewed and stable  Post vital signs: Reviewed and stable  Complications: No apparent anesthesia complications

## 2013-06-27 NOTE — Telephone Encounter (Signed)
Rebecca Flores states patient  Is being D/C home , she is requesting Zofran for nausea. DR. Gerrit FriendsGerkin has ordered Zofran 4mg  q 6-8 hrs Prn Nausea # 20 called to [facility] [address] # [phone]

## 2013-06-27 NOTE — Anesthesia Procedure Notes (Addendum)
Anesthesia Regional Block:  TAP block  Pre-Anesthetic Checklist: ,, timeout performed, Correct Patient, Correct Site, Correct Laterality, Correct Procedure, Correct Position, site marked, Risks and benefits discussed,  Surgical consent,  Pre-op evaluation,  At surgeon's request and post-op pain management  Laterality: Right and Lower  Prep: chloraprep       Needles:  Injection technique: Single-shot  Needle Type: Echogenic Needle     Needle Length: 9cm 9 cm Needle Gauge: 21 and 21 G    Additional Needles:  Procedures: ultrasound guided (picture in chart) TAP block Narrative:  Start time: 06/27/2013 10:13 AM End time: 06/27/2013 10:22 AM Injection made incrementally with aspirations every 5 mL.  Performed by: Personally  Anesthesiologist: Sheldon Silvanavid Crews, MD   Procedure Name: LMA Insertion Date/Time: 06/27/2013 11:08 AM Performed by: Gar GibbonKEETON, Eldar Robitaille S Pre-anesthesia Checklist: Patient identified, Emergency Drugs available, Suction available and Patient being monitored Patient Re-evaluated:Patient Re-evaluated prior to inductionOxygen Delivery Method: Circle System Utilized Preoxygenation: Pre-oxygenation with 100% oxygen Intubation Type: IV induction Ventilation: Mask ventilation without difficulty LMA: LMA inserted LMA Size: 4.0 Number of attempts: 1 Airway Equipment and Method: bite block Placement Confirmation: positive ETCO2 Tube secured with: Tape Dental Injury: Teeth and Oropharynx as per pre-operative assessment

## 2013-06-27 NOTE — Op Note (Signed)
Inguinal Hernia, Open, Procedure Note  Pre-operative Diagnosis:  Right inguinal hernia, direct  Post-operative Diagnosis: same  Surgeon:  Velora Hecklerodd M. Emmerie Battaglia, MD, FACS  Anesthesia:  General  Preparation:  Chlora-prep  Estimated Blood Loss: Minimal  Complications:  none  Indications: The patient presented with a right, reducible hernia.    Procedure Details  The patient was evaluated in the holding area. All of the patient's questions were answered and the proposed procedure was confirmed. The site of the procedure was properly marked. The patient was taken to the Operating Room, identified by name, and the procedure verified as inguinal hernia repair.  The patient was placed in the supine position and underwent induction of anesthesia. A "Time Out" was performed per routine. The lower abdomen and groin were prepped and draped in the usual aseptic fashion.  After ascertaining that an adequate level of anesthesia had been obtained, an incision was made in the groin with a #10 blade.  Dissection was carried through the subcutaneous tissues and hemostasis obtained with the electrocautery.  A Gelpi retractor was placed for exposure.  The external oblique fascia was incised in line with it's fibers and extended through the external inguinal ring.  There was a large direct inguinal hernia sac and adipose tissue in the inguinal canal.  This was mobilized and reduced.  Defect in the floor of the canal was closed with interrupted 0-Novofil sutures.  The floor of the inguinal canal was reconstructed with Ethicon Ultrapro mesh cut to the appropriate dimensions.  It was secured to the pubic tubercle with a 2-0 Novafil suture and along the inguinal ligament with a running 2-0 Novafil suture.  The superior margin of the mesh was secured to the transversalis and internal oblique musculature with interrupted 2-0 Novafil sutures.  Local anesthetic was infiltrated throughout the field.  External oblique fascia was  closed with interrupted 3-0 Vicryl sutures.  Subcutaneous tissues were closed with interrupted 3-0 Vicryl sutures.  Skin was anesthetized with local anesthetic, and the skin edges were re-approximated with a running 4-0 Monocryl suture.  Wound was washed and dried and benzoin and steristrips were applied.  A gauze dressing was applied.  Instrument, sponge, and needle counts were correct prior to closure and at the conclusion of the case.  The patient tolerated the procedure well.  The patient was awakened from anesthesia and brought to the recovery room in stable condition.  Velora Hecklerodd M. Sheretta Grumbine, MD, [facility] Office: [phone]

## 2013-06-27 NOTE — H&P (View-Only) (Signed)
General Surgery - [facility]  Chief Complaint  Patient presents with  . New Evaluation    evaluate right inguinal hernia - referral from Dr. Thomas Henley    HISTORY: Patient is a 72-year-old female known to my practice from previous cholecystectomy. Over the past several months the patient has noted a bulge in the right groin which intermittently causes discomfort. She was seen by her gynecologist and found on physical examination to have a right inguinal hernia. Patient denies any signs or symptoms of obstruction. Pain is positional. Patient has had a prior abdominal hysterectomy. No prior hernia surgery.  Past Medical History  Diagnosis Date  . Diabetes mellitus   . Tremors of nervous system   . Kidney stone   . Hypertension   . Movement disorder     Current Outpatient Prescriptions  Medication Sig Dispense Refill  . ACCU-CHEK SMARTVIEW test strip       . aspirin 81 MG chewable tablet Chew 81 mg by mouth every Monday, Wednesday, and Friday.       . Blood Glucose Monitoring Suppl (ACCU-CHEK NANO SMARTVIEW) W/DEVICE KIT       . Cinnamon 500 MG TABS Take 1 tablet by mouth every evening.       . fish oil-omega-3 fatty acids 1000 MG capsule Take 2 g by mouth every evening.       . lisinopril (PRINIVIL,ZESTRIL) 5 MG tablet Take 5 mg by mouth every morning.       . loratadine (CLARITIN) 10 MG tablet Take 10 mg by mouth every morning.      . omeprazole (PRILOSEC) 20 MG capsule Take 20 mg by mouth every morning.      . ONETOUCH DELICA LANCETS 33G MISC       . primidone (MYSOLINE) 50 MG tablet Take 50 mg by mouth 3 (three) times daily.       . Saxagliptin-Metformin (KOMBIGLYZE XR) 2.06-998 MG TB24 Take 1 tablet by mouth every evening.      . solifenacin (VESICARE) 5 MG tablet Take 5 mg by mouth.       No current facility-administered medications for this visit.    Allergies  Allergen Reactions  . Codeine Nausea Only  . Simvastatin     Leg cramps    Family  History  Problem Relation Age of Onset  . Dementia Mother   . Diabetes Mellitus II Father   . Prostate cancer Father   . Breast cancer Sister   . Dementia Sister     History   Social History  . Marital Status: Widowed    Spouse Name: N/A    Number of Children: N/A  . Years of Education: N/A   Social History Main Topics  . Smoking status: Former Smoker    Types: Cigarettes    Quit date: 04/23/2009  . Smokeless tobacco: Never Used  . Alcohol Use: No  . Drug Use: No  . Sexual Activity: No   Other Topics Concern  . None   Social History Narrative  . None    REVIEW OF SYSTEMS - PERTINENT POSITIVES ONLY: Denies signs or symptoms of obstruction. Intermittent discomfort right groin.  EXAM: There were no vitals filed for this visit.  GENERAL: well-developed, well-nourished, no acute distress HEENT: normocephalic; pupils equal and reactive; sclerae clear; dentition good; mucous membranes moist NECK:  symmetric on extension; no palpable anterior or posterior cervical lymphadenopathy; no supraclavicular masses; no tenderness CHEST: clear to auscultation bilaterally without rales, rhonchi, or wheezes   CARDIAC: regular rate and rhythm without significant murmur; peripheral pulses are full ABDOMEN: soft without distension; bowel sounds present; no mass; no hepatosplenomegaly; surgical incisions are well healed GU:  Palpation in the right groin shows an obvious bulge which augments with Valsalva both in a recumbent and a standing position, and is reducible; left groin without palpable abnormality EXT:  non-tender without edema; no deformity NEURO: no gross focal deficits; no sign of tremor   LABORATORY RESULTS: See [facility] for most recent results  RADIOLOGY RESULTS: See [facility] (CHL-Epic) for most recent results  IMPRESSION: Right inguinal hernia, reducible, mildly symptomatic  PLAN: The patient and I discussed the indications for surgical repair.  We discussed the options for management and the use of prosthetic mesh. We discussed performing this as an outpatient surgical procedure. We discussed restrictions on her activities following the procedure. We discussed the possibility of recurrence. Patient understands and wishes to proceed in the near future. We will make arrangements for surgery at a time convenient for the patient.  The risks and benefits of the procedure have been discussed at length with the patient.  The patient understands the proposed procedure, potential alternative treatments, and the course of recovery to be expected.  All of the patient's questions have been answered at this time.  The patient wishes to proceed with surgery.  Manali Mcelmurry M. Aiko Belko, MD, FACS General & Endocrine Surgery [facility]  Primary Care Physician: GATES,ROBERT NEVILL, MD   

## 2013-06-27 NOTE — Anesthesia Postprocedure Evaluation (Signed)
  Anesthesia Post-op Note  Patient: Rebecca BiblesChristine Cange  Procedure(s) Performed: Procedure(s): RIGHT INGUINAL HERNIA REPAIR WITH MESH (Right) INSERTION OF MESH (Right)  Patient Location: PACU  Anesthesia Type:GA combined with regional for post-op pain  Level of Consciousness: awake, alert  and oriented  Airway and Oxygen Therapy: Patient Spontanous Breathing  Post-op Pain: mild  Post-op Assessment: Post-op Vital signs reviewed  Post-op Vital Signs: Reviewed  Last Vitals:  Filed Vitals:   06/27/13 1230  BP: 94/80  Pulse: 57  Temp:   Resp: 18    Complications: No apparent anesthesia complications

## 2013-06-28 ENCOUNTER — Encounter (HOSPITAL_BASED_OUTPATIENT_CLINIC_OR_DEPARTMENT_OTHER): Payer: Self-pay | Admitting: Surgery

## 2013-07-04 ENCOUNTER — Telehealth (INDEPENDENT_AMBULATORY_CARE_PROVIDER_SITE_OTHER): Payer: Self-pay

## 2013-07-04 ENCOUNTER — Other Ambulatory Visit (INDEPENDENT_AMBULATORY_CARE_PROVIDER_SITE_OTHER): Payer: Self-pay | Admitting: General Surgery

## 2013-07-04 ENCOUNTER — Telehealth (INDEPENDENT_AMBULATORY_CARE_PROVIDER_SITE_OTHER): Payer: Self-pay | Admitting: General Surgery

## 2013-07-04 DIAGNOSIS — K4090 Unilateral inguinal hernia, without obstruction or gangrene, not specified as recurrent: Secondary | ICD-10-CM

## 2013-07-04 MED ORDER — HYDROCODONE-ACETAMINOPHEN 5-325 MG PO TABS: 1.0000 | ORAL_TABLET | ORAL | Status: DC | PRN

## 2013-07-04 NOTE — Telephone Encounter (Signed)
That's fine with me Norco 5/325  1 to 2 po q 4 hrs prn #30  If someone can sign that this morning.  If not, it will have to wait until this afternoon

## 2013-07-04 NOTE — Telephone Encounter (Signed)
Called daughter to let her know that Dr Magnus IvanBlackman refilled Rx  And it would be ready to pickup at the front desk. She verbalized understanding.

## 2013-07-04 NOTE — Telephone Encounter (Signed)
Pt s/p right inguinal hernia repair with mesh. Daughter states that the patient only has a couple of Norco 5/325mg  left and she would like to get a refill. Pt rates her pain 8 out of 10. She is only needing to take the pain meds at night. Dr Gerrit FriendsGerkin is out of the office will send to Dr Magnus IvanBlackman to refill. Please advise.

## 2013-07-04 NOTE — Telephone Encounter (Signed)
Called patient to let her know that she has an Rx for pain at the front desk

## 2013-07-19 ENCOUNTER — Encounter (INDEPENDENT_AMBULATORY_CARE_PROVIDER_SITE_OTHER): Payer: Self-pay | Admitting: Surgery

## 2013-07-19 ENCOUNTER — Ambulatory Visit (INDEPENDENT_AMBULATORY_CARE_PROVIDER_SITE_OTHER): Payer: Medicare Other | Admitting: Surgery

## 2013-07-19 VITALS — BP 122/72 | HR 70 | Temp 97.8°F | Resp 16 | Wt 145.6 lb

## 2013-07-19 DIAGNOSIS — K4090 Unilateral inguinal hernia, without obstruction or gangrene, not specified as recurrent: Secondary | ICD-10-CM

## 2013-07-19 NOTE — Patient Instructions (Signed)
  CARE OF INCISION   Apply cocoa butter/vitamin E cream (Palmer's brand) to your incision 2 - 3 times daily.  Massage cream into incision for one minute with each application.  Use sunscreen (50 SPF or higher) for first 6 months after surgery if area is exposed to sun.  You may alternate Mederma or other scar reducing cream with cocoa butter cream if desired.       Aadi Bordner M. Cayce Paschal, MD, FACS      [facility]      Office: [phone]    

## 2013-07-19 NOTE — Progress Notes (Signed)
General Surgery [facility]  Chief Complaint  Patient presents with  . Routine Post Op    RIH repair with mesh on 06/27/2013    HISTORY: Patient is a 73 year old female who underwent right inguinal hernia repair with mesh on 06/27/2013. Postoperative course has been uneventful. She returns today for a wound check.  EXAM: Surgical incision is healed nicely. Steri-Strips are then removed. Minimal soft tissue swelling. No sign of infection. With cough and Valsalva there is no sign of recurrence.  IMPRESSION: Status post right inguinal hernia repair with mesh  PLAN: Patient will begin applying topical creams to her incision. Her lifting is restricted to 20 pounds for the next 3 weeks.  Patient will return for surgical care as needed.  Velora Heckler, MD, FACS General & Endocrine Surgery [facility]   Visit Diagnoses: 1. Right inguinal hernia, reducible

## 2013-09-05 ENCOUNTER — Encounter: Payer: Self-pay | Admitting: Nurse Practitioner

## 2013-09-05 ENCOUNTER — Encounter (INDEPENDENT_AMBULATORY_CARE_PROVIDER_SITE_OTHER): Payer: Self-pay

## 2013-09-05 ENCOUNTER — Ambulatory Visit (INDEPENDENT_AMBULATORY_CARE_PROVIDER_SITE_OTHER): Payer: Medicare Other | Admitting: Nurse Practitioner

## 2013-09-05 VITALS — BP 122/71 | HR 57 | Ht 65.0 in | Wt 148.0 lb

## 2013-09-05 DIAGNOSIS — G250 Essential tremor: Secondary | ICD-10-CM

## 2013-09-05 DIAGNOSIS — G252 Other specified forms of tremor: Principal | ICD-10-CM

## 2013-09-05 MED ORDER — PRIMIDONE 50 MG PO TABS: 50.0000 mg | ORAL_TABLET | Freq: Three times a day (TID) | ORAL | Status: DC

## 2013-09-05 NOTE — Progress Notes (Signed)
[facility]  PATIENT: Rebecca Flores DOB: 1941-02-20   REASON FOR VISIT: follow up for tremor   HISTORY OF PRESENT ILLNESS:Rebecca Flores, 73 year old white female returns for followup for essential tremor. She was last seen in our office 09/05/2012. She has been followed here since June 2009 for tremor felt to represent essential tremor, and previously followed by Dr. Doy Mince. Tremor also involves the head and to a lesser extent the hands. She has been treated with primidone with improvement. She is independent in all activities of daily living and she drives a car without problems.Her father suffered Parkinson's disease, mother died of Alzheimer's disease, there was no tremors in her siblings, she is now taking primidone 50 mg three times daily at 830, 1230 and 7:30 pm.She denies significant side effects from the medications. She had hernia surgery in April. She also is a diabetic and claims her blood sugar was over 200 this morning. She has had recent changes to her diabetic medication. She returns for reevaluation    REVIEW OF SYSTEMS: Full 14 system review of systems performed and notable only for those listed, all others are neg:  Constitutional: N/A  Cardiovascular: N/A  Ear/Nose/Throat: N/A  Skin: N/A  Eyes: N/A  Respiratory: N/A  Gastroitestinal: N/A  Hematology/Lymphatic: N/A  Endocrine: N/A Musculoskeletal:N/A  Allergy/Immunology: N/A  Neurological: N/A Psychiatric: N/A Sleep : NA   ALLERGIES: Allergies  Allergen Reactions  . Codeine Nausea Only  . Simvastatin     Leg cramps    HOME MEDICATIONS: Outpatient Prescriptions Prior to Visit  Medication Sig Dispense Refill  . ACCU-CHEK SMARTVIEW test strip       . aspirin 81 MG chewable tablet Chew 81 mg by mouth every Monday, Wednesday, and Friday.       . Blood Glucose Monitoring Suppl (ACCU-CHEK NANO SMARTVIEW) W/DEVICE KIT       . Cinnamon 500 MG TABS Take 1 tablet by mouth every evening.         Marland Kitchen lisinopril (PRINIVIL,ZESTRIL) 5 MG tablet Take 5 mg by mouth every morning.       . loratadine (CLARITIN) 10 MG tablet Take 10 mg by mouth every morning.      Marland Kitchen omeprazole (PRILOSEC) 20 MG capsule Take 20 mg by mouth every morning.      Rebecca Flores DELICA LANCETS 17C MISC       . primidone (MYSOLINE) 50 MG tablet Take 50 mg by mouth 3 (three) times daily.       . Saxagliptin-Metformin (KOMBIGLYZE XR) 2.06-998 MG TB24 Take 1 tablet by mouth every evening.      . solifenacin (VESICARE) 5 MG tablet Take 5 mg by mouth daily.       . fish oil-omega-3 fatty acids 1000 MG capsule Take 2 g by mouth every evening.        No facility-administered medications prior to visit.    PAST MEDICAL HISTORY: Past Medical History  Diagnosis Date  . Diabetes mellitus   . Tremors of nervous system   . Kidney stone   . Hypertension   . Movement disorder   . Family history of anesthesia complication     sister hard to wake up    PAST SURGICAL HISTORY: Past Surgical History  Procedure Laterality Date  . Abdominal hysterectomy    . Cholecystectomy  1998  . Appendectomy    . Tonsillectomy    . Mouth surgery    . Colonoscopy    . Inguinal hernia repair Right 06/27/2013  Procedure: RIGHT INGUINAL HERNIA REPAIR WITH MESH;  Surgeon: Earnstine Regal, MD;  Location: [facility];  Service: General;  Laterality: Right;  . Insertion of mesh Right 06/27/2013    Procedure: INSERTION OF MESH;  Surgeon: Earnstine Regal, MD;  Location: [facility];  Service: General;  Laterality: Right;  . Hernia repair  05/2013    FAMILY HISTORY: Family History  Problem Relation Age of Onset  . Dementia Mother   . Diabetes Mellitus II Father   . Prostate cancer Father   . Breast cancer Sister   . Dementia Sister     SOCIAL HISTORY: History   Social History  . Marital Status: Widowed    Spouse Name: N/A    Number of Children: 2  . Years of Education: 9TH   Occupational History  .      Social History Main Topics  . Smoking status: Former Smoker    Types: Cigarettes    Quit date: 04/23/2009  . Smokeless tobacco: Never Used  . Alcohol Use: No  . Drug Use: No  . Sexual Activity: No   Other Topics Concern  . Not on file   Social History Narrative   Patient is widowed with 2 children   Patient has a 9th grade education   Patient is right handed   Patient does not drink caffeine     PHYSICAL EXAM  Filed Vitals:   09/05/13 0948  BP: 122/71  Pulse: 57  Height: _0  (1.651 m)  Weight: 148 lb (67.132 kg)   Body mass index is 24.63 kg/(m^2). General: well developed, well nourished, seated, in no evident distress  Head: head normocephalic and atraumatic. Oropharynx benign  Neurologic Exam  Mental Status: Awake and fully alert. Oriented to place and time. Follows all commands. Speech and language normal.  Cranial Nerves: Pupils equal, briskly reactive to light. Extraocular movements full without nystagmus. Visual fields full to confrontation. Hearing intact and symmetric to finger snap. Facial sensation intact. Face, tongue, palate move normally and symmetrically. Neck flexion and extension normal.  Motor: Normal bulk and tone. Normal strength in all tested extremity muscles.No focal weakness. Bilateral mild essential tremor, mild head titubation  Coordination: Rapid alternating movements normal in all extremities. Finger-to-nose and heel-to-shin performed accurately bilaterally. No dysmetria  Gait and Station: Arises from chair without difficulty. Stance is normal. Gait demonstrates normal stride length and balance . Able to heel, toe and tandem walk without difficulty. No assistive device Reflexes: 2+ and symmetric. Toes downgoing.     DIAGNOSTIC DATA (LABS, IMAGING, TESTING) - I reviewed patient records, labs, notes, testing and imaging myself where available.  Lab Results  Component Value Date   WBC 4.9 12/19/2012   HGB 12.4 06/27/2013   HCT 32.1*  12/19/2012   MCV 88.7 12/19/2012   PLT 162 12/19/2012      Component Value Date/Time   NA 138 06/22/2013 1315   K 5.1 06/22/2013 1315   CL 103 06/22/2013 1315   CO2 23 06/22/2013 1315   GLUCOSE 107* 06/22/2013 1315   BUN 17 06/22/2013 1315   CREATININE 0.70 06/22/2013 1315   CALCIUM 9.9 06/22/2013 1315   PROT 5.6* 12/18/2012 0525   ALBUMIN 3.3* 12/18/2012 0525   AST 22 12/18/2012 0525   ALT 12 12/18/2012 0525   ALKPHOS 49 12/18/2012 0525   BILITOT 0.5 12/18/2012 0525   GFRNONAA 83* 06/22/2013 1315   GFRAA >90 06/22/2013 1315    Lab Results  Component Value  Date   HGBA1C 6.7* 12/17/2012    Lab Results  Component Value Date   TSH 4.103 12/18/2012      ASSESSMENT AND PLAN  73 y.o. year old female  has a past medical history of Diabetes mellitus; Tremors of nervous system;  Movement disorder; here to followup.  Continue primidone 3 times daily at 830am, 12:30pm and 5:30p Will refill Followup yearly and when necessary Dennie Bible, [facility], [facility], APRN  [facility] [address], [place], [place] [address] [phone]

## 2013-09-05 NOTE — Patient Instructions (Signed)
Continue primidone 3 times daily Will refill Followup when necessary

## 2013-09-28 NOTE — Telephone Encounter (Signed)
Pt never picked up Rx. Will shred.

## 2013-10-06 ENCOUNTER — Encounter: Payer: Self-pay | Admitting: *Deleted

## 2013-11-26 ENCOUNTER — Encounter (HOSPITAL_COMMUNITY): Payer: Self-pay | Admitting: Emergency Medicine

## 2013-11-26 ENCOUNTER — Emergency Department (HOSPITAL_COMMUNITY): Payer: Medicare Other

## 2013-11-26 ENCOUNTER — Inpatient Hospital Stay (HOSPITAL_COMMUNITY)
Admission: EM | Admit: 2013-11-26 | Discharge: 2013-11-29 | DRG: 872 | Disposition: A | Discharge status: Home / Self Care | Payer: Medicare Other | Attending: Internal Medicine | Admitting: Internal Medicine

## 2013-11-26 DIAGNOSIS — Z833 Family history of diabetes mellitus: Secondary | ICD-10-CM

## 2013-11-26 DIAGNOSIS — E089 Diabetes mellitus due to underlying condition without complications: Secondary | ICD-10-CM

## 2013-11-26 DIAGNOSIS — E780 Pure hypercholesterolemia: Secondary | ICD-10-CM | POA: Diagnosis present

## 2013-11-26 DIAGNOSIS — Z86010 Personal history of colonic polyps: Secondary | ICD-10-CM | POA: Diagnosis not present

## 2013-11-26 DIAGNOSIS — A4151 Sepsis due to Escherichia coli [E. coli]: Principal | ICD-10-CM | POA: Diagnosis present

## 2013-11-26 DIAGNOSIS — Z79899 Other long term (current) drug therapy: Secondary | ICD-10-CM | POA: Diagnosis not present

## 2013-11-26 DIAGNOSIS — E781 Pure hyperglyceridemia: Secondary | ICD-10-CM | POA: Diagnosis present

## 2013-11-26 DIAGNOSIS — G250 Essential tremor: Secondary | ICD-10-CM | POA: Diagnosis present

## 2013-11-26 DIAGNOSIS — I10 Essential (primary) hypertension: Secondary | ICD-10-CM

## 2013-11-26 DIAGNOSIS — Z885 Allergy status to narcotic agent status: Secondary | ICD-10-CM | POA: Diagnosis not present

## 2013-11-26 DIAGNOSIS — R251 Tremor, unspecified: Secondary | ICD-10-CM

## 2013-11-26 DIAGNOSIS — A419 Sepsis, unspecified organism: Secondary | ICD-10-CM

## 2013-11-26 DIAGNOSIS — Z87891 Personal history of nicotine dependence: Secondary | ICD-10-CM | POA: Diagnosis not present

## 2013-11-26 DIAGNOSIS — Z7982 Long term (current) use of aspirin: Secondary | ICD-10-CM | POA: Diagnosis not present

## 2013-11-26 DIAGNOSIS — N390 Urinary tract infection, site not specified: Secondary | ICD-10-CM | POA: Diagnosis present

## 2013-11-26 DIAGNOSIS — N12 Tubulo-interstitial nephritis, not specified as acute or chronic: Secondary | ICD-10-CM

## 2013-11-26 DIAGNOSIS — Z882 Allergy status to sulfonamides status: Secondary | ICD-10-CM

## 2013-11-26 DIAGNOSIS — Z87442 Personal history of urinary calculi: Secondary | ICD-10-CM | POA: Diagnosis not present

## 2013-11-26 DIAGNOSIS — Z888 Allergy status to other drugs, medicaments and biological substances status: Secondary | ICD-10-CM | POA: Diagnosis not present

## 2013-11-26 DIAGNOSIS — M810 Age-related osteoporosis without current pathological fracture: Secondary | ICD-10-CM | POA: Diagnosis present

## 2013-11-26 DIAGNOSIS — J302 Other seasonal allergic rhinitis: Secondary | ICD-10-CM | POA: Diagnosis present

## 2013-11-26 DIAGNOSIS — G252 Other specified forms of tremor: Secondary | ICD-10-CM

## 2013-11-26 DIAGNOSIS — E1142 Type 2 diabetes mellitus with diabetic polyneuropathy: Secondary | ICD-10-CM | POA: Diagnosis present

## 2013-11-26 DIAGNOSIS — B9620 Unspecified Escherichia coli [E. coli] as the cause of diseases classified elsewhere: Secondary | ICD-10-CM | POA: Diagnosis present

## 2013-11-26 DIAGNOSIS — R6510 Systemic inflammatory response syndrome (SIRS) of non-infectious origin without acute organ dysfunction: Secondary | ICD-10-CM

## 2013-11-26 DIAGNOSIS — E119 Type 2 diabetes mellitus without complications: Secondary | ICD-10-CM

## 2013-11-26 DIAGNOSIS — R7881 Bacteremia: Secondary | ICD-10-CM

## 2013-11-26 DIAGNOSIS — R112 Nausea with vomiting, unspecified: Secondary | ICD-10-CM

## 2013-11-26 DIAGNOSIS — K219 Gastro-esophageal reflux disease without esophagitis: Secondary | ICD-10-CM | POA: Diagnosis present

## 2013-11-26 LAB — CBC WITH DIFFERENTIAL/PLATELET
Basophils Absolute: 0 10*3/uL (ref 0.0–0.1)
Basophils Relative: 0 % (ref 0–1)
EOS PCT: 0 % (ref 0–5)
Eosinophils Absolute: 0 10*3/uL (ref 0.0–0.7)
HCT: 36.7 % (ref 36.0–46.0)
Hemoglobin: 12.5 g/dL (ref 12.0–15.0)
LYMPHS ABS: 1.6 10*3/uL (ref 0.7–4.0)
LYMPHS PCT: 12 % (ref 12–46)
MCH: 29.8 pg (ref 26.0–34.0)
MCHC: 34.1 g/dL (ref 30.0–36.0)
MCV: 87.6 fL (ref 78.0–100.0)
Monocytes Absolute: 1.6 10*3/uL — ABNORMAL HIGH (ref 0.1–1.0)
Monocytes Relative: 12 % (ref 3–12)
Neutro Abs: 10.2 10*3/uL — ABNORMAL HIGH (ref 1.7–7.7)
Neutrophils Relative %: 76 % (ref 43–77)
Platelets: 185 10*3/uL (ref 150–400)
RBC: 4.19 MIL/uL (ref 3.87–5.11)
RDW: 12.3 % (ref 11.5–15.5)
WBC: 13.4 10*3/uL — AB (ref 4.0–10.5)

## 2013-11-26 LAB — COMPREHENSIVE METABOLIC PANEL
ALT: 35 U/L (ref 0–35)
AST: 43 U/L — ABNORMAL HIGH (ref 0–37)
Albumin: 3.7 g/dL (ref 3.5–5.2)
Alkaline Phosphatase: 124 U/L — ABNORMAL HIGH (ref 39–117)
Anion gap: 18 — ABNORMAL HIGH (ref 5–15)
BUN: 16 mg/dL (ref 6–23)
CALCIUM: 9.6 mg/dL (ref 8.4–10.5)
CO2: 18 meq/L — AB (ref 19–32)
CREATININE: 0.93 mg/dL (ref 0.50–1.10)
Chloride: 97 mEq/L (ref 96–112)
GFR, EST AFRICAN AMERICAN: 68 mL/min — AB (ref >90)
GFR, EST NON AFRICAN AMERICAN: 59 mL/min — AB (ref >90)
GLUCOSE: 183 mg/dL — AB (ref 70–99)
Potassium: 4.1 mEq/L (ref 3.7–5.3)
Sodium: 133 mEq/L — ABNORMAL LOW (ref 137–147)
Total Bilirubin: 1.1 mg/dL (ref 0.3–1.2)
Total Protein: 7.7 g/dL (ref 6.0–8.3)

## 2013-11-26 LAB — URINALYSIS, ROUTINE W REFLEX MICROSCOPIC
GLUCOSE, UA: NEGATIVE mg/dL
Ketones, ur: 40 mg/dL — AB
Nitrite: POSITIVE — AB
Protein, ur: 100 mg/dL — AB
Specific Gravity, Urine: 1.013 (ref 1.005–1.030)
Urobilinogen, UA: 1 mg/dL (ref 0.0–1.0)
pH: 5 (ref 5.0–8.0)

## 2013-11-26 LAB — URINE MICROSCOPIC-ADD ON

## 2013-11-26 LAB — I-STAT TROPONIN, ED: Troponin i, poc: 0.03 ng/mL (ref 0.00–0.08)

## 2013-11-26 LAB — GLUCOSE, CAPILLARY
Glucose-Capillary: 141 mg/dL — ABNORMAL HIGH (ref 70–99)
Glucose-Capillary: 173 mg/dL — ABNORMAL HIGH (ref 70–99)

## 2013-11-26 LAB — CBG MONITORING, ED: Glucose-Capillary: 165 mg/dL — ABNORMAL HIGH (ref 70–99)

## 2013-11-26 LAB — I-STAT CG4 LACTIC ACID, ED: Lactic Acid, Venous: 1.34 mmol/L (ref 0.5–2.2)

## 2013-11-26 MED ORDER — SODIUM CHLORIDE 0.9 % IV SOLN
INTRAVENOUS | Status: DC
  Administered 2013-11-26 – 2013-11-28 (×4): via INTRAVENOUS

## 2013-11-26 MED ORDER — PHENAZOPYRIDINE HCL 200 MG PO TABS
200.0000 mg | ORAL_TABLET | Freq: Three times a day (TID) | ORAL | Status: AC
  Administered 2013-11-26 – 2013-11-29 (×9): 200 mg via ORAL
  Filled 2013-11-26 (×9): qty 1

## 2013-11-26 MED ORDER — ASPIRIN 81 MG PO CHEW
81.0000 mg | CHEWABLE_TABLET | ORAL | Status: DC
  Administered 2013-11-27 – 2013-11-29 (×2): 81 mg via ORAL
  Filled 2013-11-26 (×2): qty 1

## 2013-11-26 MED ORDER — DEXTROSE 5 % IV SOLN
1.0000 g | Freq: Once | INTRAVENOUS | Status: AC
  Administered 2013-11-26: 1 g via INTRAVENOUS
  Filled 2013-11-26: qty 10

## 2013-11-26 MED ORDER — OXYCODONE HCL 5 MG PO TABS: 5.0000 mg | ORAL_TABLET | ORAL | Status: DC | PRN

## 2013-11-26 MED ORDER — ACETAMINOPHEN 500 MG PO TABS
1000.0000 mg | ORAL_TABLET | Freq: Once | ORAL | Status: AC
  Administered 2013-11-26: 1000 mg via ORAL
  Filled 2013-11-26: qty 2

## 2013-11-26 MED ORDER — DEXTROSE 5 % IV SOLN
1.0000 g | INTRAVENOUS | Status: DC
  Administered 2013-11-27: 1 g via INTRAVENOUS
  Filled 2013-11-26: qty 10

## 2013-11-26 MED ORDER — PANTOPRAZOLE SODIUM 40 MG PO TBEC
40.0000 mg | DELAYED_RELEASE_TABLET | Freq: Every day | ORAL | Status: DC
  Administered 2013-11-27 – 2013-11-29 (×3): 40 mg via ORAL
  Filled 2013-11-26 (×3): qty 1

## 2013-11-26 MED ORDER — PHENAZOPYRIDINE HCL 97.5 MG PO TABS: 2.0000 | ORAL_TABLET | Freq: Three times a day (TID) | ORAL | Status: DC

## 2013-11-26 MED ORDER — MAGNESIUM HYDROXIDE 400 MG/5ML PO SUSP
30.0000 mL | Freq: Every day | ORAL | Status: DC | PRN
  Administered 2013-11-28: 30 mL via ORAL
  Filled 2013-11-26: qty 30

## 2013-11-26 MED ORDER — SODIUM CHLORIDE 0.9 % IV BOLUS (SEPSIS)
1000.0000 mL | Freq: Once | INTRAVENOUS | Status: AC
  Administered 2013-11-26: 1000 mL via INTRAVENOUS

## 2013-11-26 MED ORDER — SODIUM CHLORIDE 0.9 % IV BOLUS (SEPSIS)
500.0000 mL | Freq: Once | INTRAVENOUS | Status: AC
  Administered 2013-11-26: 500 mL via INTRAVENOUS

## 2013-11-26 MED ORDER — ONDANSETRON HCL 4 MG PO TABS
4.0000 mg | ORAL_TABLET | Freq: Four times a day (QID) | ORAL | Status: DC | PRN
  Administered 2013-11-27: 4 mg via ORAL
  Filled 2013-11-26: qty 1

## 2013-11-26 MED ORDER — ONDANSETRON HCL 4 MG/2ML IJ SOLN
4.0000 mg | Freq: Once | INTRAMUSCULAR | Status: AC
  Administered 2013-11-26: 4 mg via INTRAVENOUS
  Filled 2013-11-26: qty 2

## 2013-11-26 MED ORDER — LORATADINE 10 MG PO TABS
10.0000 mg | ORAL_TABLET | Freq: Every day | ORAL | Status: DC | PRN
  Filled 2013-11-26: qty 1

## 2013-11-26 MED ORDER — GUAIFENESIN-DM 100-10 MG/5ML PO SYRP: 5.0000 mL | ORAL_SOLUTION | ORAL | Status: DC | PRN

## 2013-11-26 MED ORDER — INSULIN ASPART 100 UNIT/ML ~~LOC~~ SOLN
0.0000 [IU] | Freq: Three times a day (TID) | SUBCUTANEOUS | Status: DC
  Administered 2013-11-27 – 2013-11-28 (×4): 2 [IU] via SUBCUTANEOUS
  Administered 2013-11-29: 1 [IU] via SUBCUTANEOUS
  Administered 2013-11-29: 2 [IU] via SUBCUTANEOUS

## 2013-11-26 MED ORDER — VITAMIN D3 25 MCG (1000 UNIT) PO TABS
1000.0000 [IU] | ORAL_TABLET | Freq: Every day | ORAL | Status: DC
  Administered 2013-11-27 – 2013-11-29 (×3): 1000 [IU] via ORAL
  Filled 2013-11-26 (×3): qty 1

## 2013-11-26 MED ORDER — ACETAMINOPHEN 325 MG PO TABS
650.0000 mg | ORAL_TABLET | Freq: Four times a day (QID) | ORAL | Status: DC | PRN
  Administered 2013-11-26 – 2013-11-28 (×6): 650 mg via ORAL
  Filled 2013-11-26 (×6): qty 2

## 2013-11-26 MED ORDER — AMITRIPTYLINE HCL 25 MG PO TABS
25.0000 mg | ORAL_TABLET | Freq: Every day | ORAL | Status: DC
  Administered 2013-11-26 – 2013-11-28 (×3): 25 mg via ORAL
  Filled 2013-11-26 (×4): qty 1

## 2013-11-26 MED ORDER — ALBUTEROL SULFATE (2.5 MG/3ML) 0.083% IN NEBU: 2.5000 mg | INHALATION_SOLUTION | RESPIRATORY_TRACT | Status: DC | PRN

## 2013-11-26 MED ORDER — ONDANSETRON HCL 4 MG/2ML IJ SOLN
4.0000 mg | Freq: Four times a day (QID) | INTRAMUSCULAR | Status: DC | PRN
  Administered 2013-11-28: 4 mg via INTRAVENOUS
  Filled 2013-11-26: qty 2

## 2013-11-26 MED ORDER — DARIFENACIN HYDROBROMIDE ER 7.5 MG PO TB24
7.5000 mg | ORAL_TABLET | Freq: Every day | ORAL | Status: DC
  Administered 2013-11-26 – 2013-11-29 (×4): 7.5 mg via ORAL
  Filled 2013-11-26 (×4): qty 1

## 2013-11-26 MED ORDER — ALUM & MAG HYDROXIDE-SIMETH 200-200-20 MG/5ML PO SUSP: 30.0000 mL | Freq: Four times a day (QID) | ORAL | Status: DC | PRN

## 2013-11-26 MED ORDER — LISINOPRIL 5 MG PO TABS
5.0000 mg | ORAL_TABLET | Freq: Every morning | ORAL | Status: DC
  Administered 2013-11-28 – 2013-11-29 (×2): 5 mg via ORAL
  Filled 2013-11-26 (×3): qty 1

## 2013-11-26 MED ORDER — PRIMIDONE 50 MG PO TABS
50.0000 mg | ORAL_TABLET | Freq: Three times a day (TID) | ORAL | Status: DC
  Administered 2013-11-26: 50 mg via ORAL
  Filled 2013-11-26 (×4): qty 1

## 2013-11-26 MED ORDER — ENOXAPARIN SODIUM 40 MG/0.4ML ~~LOC~~ SOLN
40.0000 mg | SUBCUTANEOUS | Status: DC
  Administered 2013-11-26 – 2013-11-27 (×2): 40 mg via SUBCUTANEOUS
  Filled 2013-11-26 (×4): qty 0.4

## 2013-11-26 MED ORDER — MORPHINE SULFATE 2 MG/ML IJ SOLN
2.0000 mg | Freq: Once | INTRAMUSCULAR | Status: AC
  Administered 2013-11-26: 2 mg via INTRAVENOUS
  Filled 2013-11-26: qty 1

## 2013-11-26 MED ORDER — ACETAMINOPHEN 650 MG RE SUPP: 650.0000 mg | Freq: Four times a day (QID) | RECTAL | Status: DC | PRN

## 2013-11-26 MED ORDER — MORPHINE SULFATE 2 MG/ML IJ SOLN: 1.0000 mg | INTRAMUSCULAR | Status: DC | PRN

## 2013-11-26 NOTE — H&P (Signed)
PATIENT DETAILS Name: Rebecca Flores Age: 73 y.o. Sex: female Date of Birth: 21-Dec-1940 Admit Date: 11/26/2013 ZOX:[id number]:GATES,ROBERT NEVILL, MD   CHIEF COMPLAINT:  Fever, dysuria, pelvic pain, because of urination and right flank pain> left flank pain since this past Thursday  HPI: Rebecca Flores is a 73 y.o. female with a Past Medical History of hypertension, diabetes who presents today with the above noted complaint. Per patient, since this past Thursday she has developed significant dysuria, associated with frequency of urination. She claims that her urine is foul smelling. This has been associated with subjective fever associated with chills and sweats. For the past few days she is also noted worsening bilateral flank pain, but more worse on the right compared to the left. Because of these persistent symptoms, she presented to the emergency room today where she was found to have leukocytosis, a urinalysis was consistent with UTI. She was then referred to the hospitalist service for admission. Patient denies any headache, chest pain, shortness of breath. She does claim to have nausea, but otherwise no vomiting. No history of diarrhea. Denies any abdominal pain. She is now being admitted for further evaluation and treatment   ALLERGIES:   Allergies  Allergen Reactions  . Nsaids     Severe facial swelling/rash   . Atenolol     Hair loss   . Codeine Nausea Only    Nausea   . Primidone     Emotional lability   . Simvastatin     Leg cramps  . Sulfa Antibiotics     Pruritis      PAST MEDICAL HISTORY: Past Medical History  Diagnosis Date  . Diabetes mellitus   . Tremors of nervous system   . Kidney stone   . Hypertension   . Movement disorder   . Family history of anesthesia complication     sister hard to wake up  . Hypercholesteremia   . Osteoporosis   . Tobacco use   . Bradycardia   . GERD (gastroesophageal reflux disease)   . Colon polyp   . Peripheral  neuropathy   . Seasonal allergies     PAST SURGICAL HISTORY: Past Surgical History  Procedure Laterality Date  . Abdominal hysterectomy    . Cholecystectomy  1998  . Appendectomy    . Tonsillectomy    . Mouth surgery    . Colonoscopy    . Inguinal hernia repair Right 06/27/2013    Procedure: RIGHT INGUINAL HERNIA REPAIR WITH MESH;  Surgeon: Velora Hecklerodd M Gerkin, MD;  Location: [facility];  Service: General;  Laterality: Right;  . Insertion of mesh Right 06/27/2013    Procedure: INSERTION OF MESH;  Surgeon: Velora Hecklerodd M Gerkin, MD;  Location: [facility];  Service: General;  Laterality: Right;  . Hernia repair  05/2013  . Bladder tacking      MEDICATIONS AT HOME: Prior to Admission medications   Medication Sig Start Date End Date Taking? Authorizing Provider  amitriptyline (ELAVIL) 25 MG tablet Take 25 mg by mouth at bedtime.   Yes Historical Provider, MD  aspirin 81 MG chewable tablet Chew 81 mg by mouth every Monday, Wednesday, and Friday.    Yes Historical Provider, MD  cholecalciferol (VITAMIN D) 1000 UNITS tablet Take 1,000 Units by mouth daily.   Yes Historical Provider, MD  Cinnamon 500 MG TABS Take 1 tablet by mouth every evening.    Yes Historical Provider, MD  lisinopril (PRINIVIL,ZESTRIL) 5 MG tablet  Take 5 mg by mouth every morning.  08/06/12  Yes Historical Provider, MD  loratadine (CLARITIN) 10 MG tablet Take 10 mg by mouth daily as needed for allergies.    Yes Historical Provider, MD  omeprazole (PRILOSEC) 20 MG capsule Take 20 mg by mouth every morning.   Yes Historical Provider, MD  Phenazopyridine HCl 97.5 MG TABS Take 2 tablets by mouth 3 (three) times daily.   Yes Historical Provider, MD  primidone (MYSOLINE) 50 MG tablet Take 1 tablet (50 mg total) by mouth 3 (three) times daily. 09/05/13  Yes Nilda Riggs, NP  Saxagliptin-Metformin (KOMBIGLYZE XR) 2.06-998 MG TB24 Take 1 tablet by mouth every evening.   Yes Historical Provider, MD  solifenacin  (VESICARE) 5 MG tablet Take 5 mg by mouth daily.    Yes Historical Provider, MD    FAMILY HISTORY: Family History  Problem Relation Age of Onset  . Dementia Mother   . Diabetes Mellitus II Father   . Prostate cancer Father   . Breast cancer Sister   . Dementia Sister    SOCIAL HISTORY:  reports that she quit smoking about 4 years ago. Her smoking use included Cigarettes. She smoked 0.00 packs per day. She has never used smokeless tobacco. She reports that she does not drink alcohol or use illicit drugs.  REVIEW OF SYSTEMS:  Constitutional:   No  weight loss, night sweats,  Fevers, chills, fatigue.  HEENT:    No headaches, Difficulty swallowing,Tooth/dental problems,Sore throat,  No sneezing, itching, ear ache, nasal congestion, post nasal drip,   Cardio-vascular: No chest pain,  Orthopnea, PND, swelling in lower extremities, anasarca,  dizziness, palpitations  GI:  No heartburn, indigestion, abdominal pain,vomiting, diarrhea, change in  bowel habits, loss of appetite  Resp: No shortness of breath with exertion or at rest.  No excess mucus, no productive cough, No non-productive cough,  No coughing up of blood.No change in color of mucus.No wheezing.No chest wall deformity  Skin:  no rash or lesions.  GU:  no dysuria, change in color of urine, no urgency or frequency.  No flank pain.  Musculoskeletal: No joint pain or swelling.  No decreased range of motion.  No back pain.  Psych: No change in mood or affect. No depression or anxiety.  No memory loss.   PHYSICAL EXAM: Blood pressure 96/44, pulse 64, temperature 98.7 F (37.1 C), temperature source Oral, resp. rate 22, SpO2 94.00%.  General appearance :Awake, alert, not in any distress. Speech Clear. Not toxic Looking HEENT: Atraumatic and Normocephalic, pupils equally reactive to light and accomodation Neck: supple, no JVD. No cervical lymphadenopathy.  Chest:Good air entry bilaterally, no added sounds  CVS: S1 S2  regular, no murmurs.  Abdomen: Bowel sounds present, mildly tender suprapubic area, abdomen is not distended with no gaurding, rigidity or rebound. Right more than left CVA tenderness Extremities: B/L Lower Ext shows no edema, both legs are warm to touch Neurology: Awake alert, and oriented X 3, CN II-XII intact, Non focal Skin:No Rash Wounds:N/A  LABS ON ADMISSION:   Recent Labs  11/26/13 1450  NA 133*  K 4.1  CL 97  CO2 18*  GLUCOSE 183*  BUN 16  CREATININE 0.93  CALCIUM 9.6    Recent Labs  11/26/13 1450  AST 43*  ALT 35  ALKPHOS 124*  BILITOT 1.1  PROT 7.7  ALBUMIN 3.7   No results found for this basename: LIPASE, AMYLASE,  in the last 72 hours  Recent Labs  11/26/13  1450  WBC 13.4*  NEUTROABS 10.2*  HGB 12.5  HCT 36.7  MCV 87.6  PLT 185   No results found for this basename: CKTOTAL, CKMB, CKMBINDEX, TROPONINI,  in the last 72 hours No results found for this basename: DDIMER,  in the last 72 hours No components found with this basename: POCBNP,    RADIOLOGIC STUDIES ON ADMISSION: Dg Chest 2 View  11/26/2013   CLINICAL DATA:  Dysuria and fever today, Initial visit  EXAM: CHEST  2 VIEW  COMPARISON:  04/24/2011  FINDINGS: Stable mild cardiac enlargement. Vascular pattern is normal. No consolidation or effusion.  IMPRESSION: No active cardiopulmonary disease.   Electronically Signed   By: Esperanza Heir M.D.   On: 11/26/2013 15:48     EKG: Independently reviewed. Sinus rhythm  ASSESSMENT AND PLAN: Present on Admission:  . Pyelonephritis - Symptoms consistent with pyelonephritis, UA suggestive of UTI. Admit to medical surgical unit, start empiric Rocephin.follow urine and blood cultures, and adjust antibiotics accordingly. She appears stable and nontoxic looking.   Marland Kitchen SIRS (systemic inflammatory response syndrome) - Likely secondary to above. Will treat with IV Rocephin, IV fluids, follow culture data.Monitor closely, follow clinical course   . HTN  (hypertension) - Continue lisinopril, follow BP trend and adjust medications accordingly   . Essential and other specified forms of tremor - Stable, continue outpatient medications.  Further plan will depend as patient's clinical course evolves and further radiologic and laboratory data become available. Patient will be monitored closely.  Above noted plan was discussed with patient/her daughter, they were in agreement.   DVT Prophylaxis: Prophylactic Lovenox  Code Status: Full Code  Total time spent for admission equals 45 minutes.  [facility] Pager [phone]  If 7PM-7AM, please contact night-coverage [email or web address] Password [email or web address] 11/26/2013, 5:19 PM  **Disclaimer: This note may have been dictated with voice recognition software. Similar sounding words can inadvertently be transcribed and this note may contain transcription errors which may not have been corrected upon publication of note.**

## 2013-11-26 NOTE — ED Provider Notes (Signed)
CSN: [id number]     Arrival date & time 11/26/13  1401 History   First MD Initiated Contact with Patient 11/26/13 1419     Chief Complaint  Patient presents with  . Dysuria     (Consider location/radiation/quality/duration/timing/severity/associated sxs/prior Treatment) HPI  Rebecca Flores is a 73 y.o. female with past medical history significant for non-insulin-dependent diabetes, hypertension, essential tremor complaining of lower abdominal pain that radiates around to the low back bilaterally, dysuria, urinary frequency and concentrated urine worsening over the course of 2 days. Associated symptoms of chills, rigors, nausea without vomiting, constipation (patient normally states that she has diarrhea 4 times a day but she's been having normally formed stool once per day). She denies chest pain, cough, shortness of breath, abnormal vaginal discharge, emesis, headache, fever, rash, frequent urinary tract infections. States she took 1 Azo  tablet this morning.  Past Medical History  Diagnosis Date  . Diabetes mellitus   . Tremors of nervous system   . Kidney stone   . Hypertension   . Movement disorder   . Family history of anesthesia complication     sister hard to wake up  . Hypercholesteremia   . Osteoporosis   . Tobacco use   . Bradycardia   . GERD (gastroesophageal reflux disease)   . Colon polyp   . Peripheral neuropathy   . Seasonal allergies    Past Surgical History  Procedure Laterality Date  . Abdominal hysterectomy    . Cholecystectomy  1998  . Appendectomy    . Tonsillectomy    . Mouth surgery    . Colonoscopy    . Inguinal hernia repair Right 06/27/2013    Procedure: RIGHT INGUINAL HERNIA REPAIR WITH MESH;  Surgeon: Velora Hecklerodd M Gerkin, MD;  Location: [facility];  Service: General;  Laterality: Right;  . Insertion of mesh Right 06/27/2013    Procedure: INSERTION OF MESH;  Surgeon: Velora Hecklerodd M Gerkin, MD;  Location: [facility];  Service:  General;  Laterality: Right;  . Hernia repair  05/2013  . Bladder tacking     Family History  Problem Relation Age of Onset  . Dementia Mother   . Diabetes Mellitus II Father   . Prostate cancer Father   . Breast cancer Sister   . Dementia Sister    History  Substance Use Topics  . Smoking status: Former Smoker    Types: Cigarettes    Quit date: 04/23/2009  . Smokeless tobacco: Never Used  . Alcohol Use: No   OB History   Grav Para Term Preterm Abortions TAB SAB Ect Mult Living                 Review of Systems  10 systems reviewed and found to be negative, except as noted in the HPI.   Allergies  Nsaids; Atenolol; Codeine; Primidone; Simvastatin; and Sulfa antibiotics  Home Medications   Prior to Admission medications   Medication Sig Start Date End Date Taking? Authorizing Provider  amitriptyline (ELAVIL) 25 MG tablet Take 25 mg by mouth at bedtime.   Yes Historical Provider, MD  aspirin 81 MG chewable tablet Chew 81 mg by mouth every Monday, Wednesday, and Friday.    Yes Historical Provider, MD  cholecalciferol (VITAMIN D) 1000 UNITS tablet Take 1,000 Units by mouth daily.   Yes Historical Provider, MD  Cinnamon 500 MG TABS Take 1 tablet by mouth every evening.    Yes Historical Provider, MD  lisinopril (PRINIVIL,ZESTRIL) 5 MG tablet  Take 5 mg by mouth every morning.  08/06/12  Yes Historical Provider, MD  loratadine (CLARITIN) 10 MG tablet Take 10 mg by mouth daily as needed for allergies.    Yes Historical Provider, MD  omeprazole (PRILOSEC) 20 MG capsule Take 20 mg by mouth every morning.   Yes Historical Provider, MD  Phenazopyridine HCl 97.5 MG TABS Take 2 tablets by mouth 3 (three) times daily.   Yes Historical Provider, MD  primidone (MYSOLINE) 50 MG tablet Take 1 tablet (50 mg total) by mouth 3 (three) times daily. 09/05/13  Yes Nilda Riggs, NP  Saxagliptin-Metformin (KOMBIGLYZE XR) 2.06-998 MG TB24 Take 1 tablet by mouth every evening.   Yes Historical  Provider, MD  solifenacin (VESICARE) 5 MG tablet Take 5 mg by mouth daily.    Yes Historical Provider, MD   BP 114/58  Pulse 72  Temp(Src) 101.9 F (38.8 C) (Oral)  Resp 16  SpO2 94% Physical Exam  Nursing note and vitals reviewed. Constitutional: She is oriented to person, place, and time. She appears well-developed and well-nourished. No distress.  Well-appearing  HENT:  Head: Normocephalic and atraumatic.  Mouth/Throat: Oropharynx is clear and moist.  Eyes: Conjunctivae and EOM are normal. Pupils are equal, round, and reactive to light.  Neck: Normal range of motion.  Cardiovascular: Normal rate, regular rhythm and intact distal pulses.   Pulmonary/Chest: Effort normal and breath sounds normal. No stridor.  Abdominal: Soft. Bowel sounds are normal. She exhibits no distension and no mass. There is tenderness. There is no rebound and no guarding.  Mild tenderness to deep palpation of the suprapubic area. No guarding or rebound.  Genitourinary:  No CVA tenderness palpation bilaterally.  Musculoskeletal: Normal range of motion.  Neurological: She is alert and oriented to person, place, and time.  Essential tremor of head in 4 extremities at her baseline as per patient and daughter  Psychiatric: She has a normal mood and affect.    ED Course  Procedures (including critical care time) Labs Review Labs Reviewed  URINALYSIS, ROUTINE W REFLEX MICROSCOPIC - Abnormal; Notable for the following:    Color, Urine ORANGE (*)    APPearance TURBID (*)    Hgb urine dipstick SMALL (*)    Bilirubin Urine SMALL (*)    Ketones, ur 40 (*)    Protein, ur 100 (*)    Nitrite POSITIVE (*)    Leukocytes, UA LARGE (*)    All other components within normal limits  CBC WITH DIFFERENTIAL - Abnormal; Notable for the following:    WBC 13.4 (*)    Neutro Abs 10.2 (*)    Monocytes Absolute 1.6 (*)    All other components within normal limits  COMPREHENSIVE METABOLIC PANEL - Abnormal; Notable for the  following:    Sodium 133 (*)    CO2 18 (*)    Glucose, Bld 183 (*)    AST 43 (*)    Alkaline Phosphatase 124 (*)    GFR calc non Af Amer 59 (*)    GFR calc Af Amer 68 (*)    Anion gap 18 (*)    All other components within normal limits  URINE MICROSCOPIC-ADD ON - Abnormal; Notable for the following:    Bacteria, UA FEW (*)    Casts GRANULAR CAST (*)    All other components within normal limits  CBG MONITORING, ED - Abnormal; Notable for the following:    Glucose-Capillary 165 (*)    All other components within normal limits  CULTURE, BLOOD (ROUTINE X 2)  CULTURE, BLOOD (ROUTINE X 2)  URINE CULTURE  I-STAT TROPOININ, ED  I-STAT CG4 LACTIC ACID, ED    Imaging Review Dg Chest 2 View  11/26/2013   CLINICAL DATA:  Dysuria and fever today, Initial visit  EXAM: CHEST  2 VIEW  COMPARISON:  04/24/2011  FINDINGS: Stable mild cardiac enlargement. Vascular pattern is normal. No consolidation or effusion.  IMPRESSION: No active cardiopulmonary disease.   Electronically Signed   By: Esperanza Heir M.D.   On: 11/26/2013 15:48     EKG Interpretation None      MDM   Final diagnoses:  Sepsis, due to unspecified organism  UTI (lower urinary tract infection)    Filed Vitals:   11/26/13 1414 11/26/13 1555  BP: 149/66 114/58  Pulse: 102 72  Temp: 101.9 F (38.8 C)   TempSrc: Oral   Resp: 18 16  SpO2: 98% 94%    Medications  cefTRIAXone (ROCEPHIN) 1 g in dextrose 5 % 50 mL IVPB (1 g Intravenous New Bag/Given 11/26/13 1553)  sodium chloride 0.9 % bolus 500 mL (500 mLs Intravenous New Bag/Given 11/26/13 1448)  acetaminophen (TYLENOL) tablet 1,000 mg (1,000 mg Oral Given 11/26/13 1448)  morphine 2 MG/ML injection 2 mg (2 mg Intravenous Given 11/26/13 1455)  ondansetron (ZOFRAN) injection 4 mg (4 mg Intravenous Given 11/26/13 1454)    Rebecca Flores is a 73 y.o. female presenting with dysuria, lower abdominal and low back pain, patient is febrile to 100.9. Sepsis workup initiated.  Patient will be given small fluid bolus and pain.   Vision with normal lactate and elevated white blood cell count of 13.4.   Urinalysis consistent with infection. Please note that she has taken 8 so this morning. Chest x-ray without infiltrate. She has no prior urine cultures and affect so I will start her on Rocephin pending urine culture. Case discussed with [facility]ad hospitalist Dr. Jerral Ralph, except admission.  This is a shared visit with the attending physician who personally evaluated the patient and agrees with the care plan.         Wynetta Emery, PA-C 11/26/13 1623

## 2013-11-26 NOTE — ED Notes (Signed)
Pt reports dysuria and difficulty urinating since Friday. Pain 8/10. Pt febrile. Family reports pt is a little more agitated than normal. Pt has tremors normally, but family thinks they are worse because of the fever and possible UTI.

## 2013-11-26 NOTE — ED Notes (Signed)
I have just given report to Lafonda Mossesiana, RN on [address]--[facility] will transport her now.  She remains in no distress and her daughter is still with her.  They thank us for our care.

## 2013-11-27 DIAGNOSIS — R7881 Bacteremia: Secondary | ICD-10-CM | POA: Diagnosis present

## 2013-11-27 DIAGNOSIS — R112 Nausea with vomiting, unspecified: Secondary | ICD-10-CM

## 2013-11-27 DIAGNOSIS — I10 Essential (primary) hypertension: Secondary | ICD-10-CM

## 2013-11-27 LAB — CBC
HCT: 35 % — ABNORMAL LOW (ref 36.0–46.0)
Hemoglobin: 12 g/dL (ref 12.0–15.0)
MCH: 30.8 pg (ref 26.0–34.0)
MCHC: 34.3 g/dL (ref 30.0–36.0)
MCV: 89.7 fL (ref 78.0–100.0)
PLATELETS: 181 10*3/uL (ref 150–400)
RBC: 3.9 MIL/uL (ref 3.87–5.11)
RDW: 12.6 % (ref 11.5–15.5)
WBC: 10.2 10*3/uL (ref 4.0–10.5)

## 2013-11-27 LAB — GLUCOSE, CAPILLARY
GLUCOSE-CAPILLARY: 165 mg/dL — AB (ref 70–99)
Glucose-Capillary: 159 mg/dL — ABNORMAL HIGH (ref 70–99)
Glucose-Capillary: 166 mg/dL — ABNORMAL HIGH (ref 70–99)
Glucose-Capillary: 181 mg/dL — ABNORMAL HIGH (ref 70–99)

## 2013-11-27 LAB — BASIC METABOLIC PANEL
Anion gap: 15 (ref 5–15)
BUN: 18 mg/dL (ref 6–23)
CALCIUM: 8.4 mg/dL (ref 8.4–10.5)
CO2: 19 mEq/L (ref 19–32)
Chloride: 103 mEq/L (ref 96–112)
Creatinine, Ser: 1.01 mg/dL (ref 0.50–1.10)
GFR calc Af Amer: 62 mL/min — ABNORMAL LOW (ref >90)
GFR, EST NON AFRICAN AMERICAN: 53 mL/min — AB (ref >90)
Glucose, Bld: 174 mg/dL — ABNORMAL HIGH (ref 70–99)
Potassium: 4 mEq/L (ref 3.7–5.3)
SODIUM: 137 meq/L (ref 137–147)

## 2013-11-27 MED ORDER — PIPERACILLIN-TAZOBACTAM 3.375 G IVPB
3.3750 g | Freq: Three times a day (TID) | INTRAVENOUS | Status: DC
  Administered 2013-11-27 – 2013-11-29 (×5): 3.375 g via INTRAVENOUS
  Filled 2013-11-27 (×6): qty 50

## 2013-11-27 MED ORDER — PRIMIDONE 50 MG PO TABS
50.0000 mg | ORAL_TABLET | Freq: Three times a day (TID) | ORAL | Status: DC
  Administered 2013-11-27 – 2013-11-29 (×7): 50 mg via ORAL
  Filled 2013-11-27 (×9): qty 1

## 2013-11-27 NOTE — Progress Notes (Signed)
ANTIBIOTIC CONSULT NOTE - INITIAL  Pharmacy Consult for Zosyn Indication: urosepsis  Allergies  Allergen Reactions  . Nsaids     Severe facial swelling/rash   . Atenolol     Hair loss   . Codeine Nausea Only    Nausea   . Primidone     Emotional lability   . Simvastatin     Leg cramps  . Sulfa Antibiotics     Pruritis     Patient Measurements: Height: 5' 5.5" (166.4 cm) Weight: 156 lb 4.9 oz (70.9 kg) IBW/kg (Calculated) : 58.15  Vital Signs: BP: 119/50 mmHg (10/05 1400) Intake/Output from previous day: 10/04 0701 - 10/05 0700 In: 1135 [I.V.:1135] Out: -  Intake/Output from this shift:    Labs:  Recent Labs  11/26/13 1450 11/27/13 0430  WBC 13.4* 10.2  HGB 12.5 12.0  PLT 185 181  CREATININE 0.93 1.01   Estimated Creatinine Clearance: 48.8 ml/min (by C-G formula based on Cr of 1.01). No results found for this basename: Rolm Gala, VANCORANDOM, GENTTROUGH, GENTPEAK, GENTRANDOM, TOBRATROUGH, TOBRAPEAK, TOBRARND, AMIKACINPEAK, AMIKACINTROU, AMIKACIN,  in the last 72 hours   Microbiology: Recent Results (from the past 720 hour(s))  URINE CULTURE     Status: None   Collection Time    11/26/13  2:47 PM      Result Value Ref Range Status   Specimen Description URINE, CATHETERIZED   Final   Special Requests NONE   Final   Culture  Setup Time     Final   Value: 11/26/2013 23:01     Performed at [facility] Count     Final   Value: >=100,000 COLONIES/ML     Performed at [facility]   Culture     Final   Value: ESCHERICHIA COLI     Performed at [facility]   Report Status PENDING   Incomplete  CULTURE, BLOOD (ROUTINE X 2)     Status: None   Collection Time    11/26/13  2:50 PM      Result Value Ref Range Status   Specimen Description BLOOD RIGHT FOREARM  5 ML IN [facility] BOTTLE   Final   Special Requests NONE   Final   Culture  Setup Time     Final   Value: 11/26/2013 22:18     Performed at [facility]   Culture     Final   Value: GRAM NEGATIVE RODS     Note: Gram Stain Report Called to,Read Back By and Verified With: CINDY@12 :13PM ON 11/27/13 BY DANTS     Performed at [facility]   Report Status PENDING   Incomplete  CULTURE, BLOOD (ROUTINE X 2)     Status: None   Collection Time    11/26/13  2:50 PM      Result Value Ref Range Status   Specimen Description BLOOD LEFT HAND  3 ML IN [facility] BOTTLE   Final   Special Requests NONE   Final   Culture  Setup Time     Final   Value: 11/26/2013 22:18     Performed at [facility]   Culture     Final   Value: GRAM NEGATIVE RODS     Performed at [facility]   Report Status PENDING   Incomplete   Medical History: Past Medical History  Diagnosis Date  . Diabetes mellitus   . Tremors of nervous system   . Kidney stone   .  Hypertension   . Movement disorder   . Family history of anesthesia complication     sister hard to wake up  . Hypercholesteremia   . Osteoporosis   . Tobacco use   . Bradycardia   . GERD (gastroesophageal reflux disease)   . Colon polyp   . Peripheral neuropathy   . Seasonal allergies    Medications:  Scheduled:  . amitriptyline  25 mg Oral QHS  . aspirin  81 mg Oral Q M,W,F  . cholecalciferol  1,000 Units Oral Daily  . darifenacin  7.5 mg Oral Daily  . enoxaparin (LOVENOX) injection  40 mg Subcutaneous Q24H  . insulin aspart  0-9 Units Subcutaneous TID WC  . lisinopril  5 mg Oral q morning - 10a  . pantoprazole  40 mg Oral Daily  . phenazopyridine  200 mg Oral TID WC  . piperacillin-tazobactam (ZOSYN)  IV  3.375 g Intravenous 3 times per day  . primidone  50 mg Oral TID WC   Anti-infectives   Start     Dose/Rate Route Frequency Ordered Stop   11/27/13 2100  piperacillin-tazobactam (ZOSYN) IVPB 3.375 g     3.375 g 12.5 mL/hr over 240 Minutes Intravenous 3 times per day 11/27/13 2030     11/27/13 1000  cefTRIAXone (ROCEPHIN) 1 g in dextrose 5 % 50 mL IVPB  Status:   Discontinued     1 g 100 mL/hr over 30 Minutes Intravenous Every 24 hours 11/26/13 1811 11/27/13 2015   11/26/13 1600  cefTRIAXone (ROCEPHIN) 1 g in dextrose 5 % 50 mL IVPB     1 g 100 mL/hr over 30 Minutes Intravenous  Once 11/26/13 1548 11/26/13 1650     Assessment: 73 yoF with dysuria, flank pain. Urine culture 10/4 growing E coli, sensitivities pending. Blood cultures growing 2/2 GNR. Received empiric Ceftriaxone daily x2, change to Zosyn per pharmacy.   Goal of Therapy:  Antibiotic dose/schedule appropriate for treatment of infection,renal function  Plan:   Zosyn 3/375gm IV q8hr - 4 hr infusion  Otho BellowsGreen, Deniz Hannan L PharmD Pager [phone] 11/27/2013, 8:34 PM

## 2013-11-27 NOTE — Progress Notes (Signed)
Patient ID: Rebecca Flores, female   DOB: [birth year], 73 y.o.   MRN: [id number] [facility] PROGRESS NOTE  Rebecca Flores VOJ:[id number] DOB: [birth year] DOA: 11/26/2013 PCP: Henrine Screws, MD  Brief narrative: 73 y.o. female with a past medical history of hypertension, diabetes who presented to [facility] ED 11/26/2013 with worsening dysuria, urinary frequency and foul-smelling urine. Patient also reported subjective fevers with associated chills and diaphoresis as well as bilateral flank pain however worse on the right compared to the left. On admission, patient was found to have urinary tract infection based on urinalysis. She was started on Rocephin for treatment of UTI.  Assessment/Plan:    Principal Problem: Sepsis secondary to urinary tract infection and possible pyelonephritis  Sepsis criteria met on the admission considering vital signs that include hypertension, tachycardia, tachypnea, fever. In addition patient had elevated white blood cell count 13.4. Urinalysis was significant for large leukocytes and too numerous to count white blood cells.  The patient was started on Rocephin on the admission. Followup urine culture results.  Continue supportive care with IV fluids. Active Problems:   DM (diabetes mellitus), controlled  Check A1c. Continue sliding scale insulin.   HTN (hypertension)  Continue lisinopril 5 mg daily   Essential and other specified forms of tremor   Stable, continue outpatient medications.   DVT Prophylaxis   Lovenox subQ ordered.    Code Status: Full.  Family Communication:  plan of care discussed with the patient Disposition Plan: Home when stable.    IV Access:   Peripheral IV Procedures and diagnostic studies:    Dg Chest 2 View 11/26/2013  No active cardiopulmonary disease.    Medical Consultants:   None Other Consultants:   None Anti-Infectives:   Rocephin 11/26/2013 -->   Leisa Lenz, MD  [facility] Pager  [phone]  If 7PM-7AM, please contact night-coverage [email or web address] Password [email or web address] 11/27/2013, 11:22 AM   LOS: 1 day    HPI/Subjective: No acute overnight events.  Objective: Filed Vitals:   11/26/13 1706 11/26/13 1815 11/26/13 2108 11/27/13 0622  BP: 96/44 122/56 126/54 140/55  Pulse: 64 60 93 95  Temp: 98.7 F (37.1 C) 98.3 F (36.8 C) 97.9 F (36.6 C) 99 F (37.2 C)  TempSrc: Oral Oral Oral Oral  Resp: $Remo'22 18 16 16  'txGya$ Height:  5' 5.5" (1.664 m)    Weight:  70.9 kg (156 lb 4.9 oz)    SpO2: 94% 91% 93% 97%    Intake/Output Summary (Last 24 hours) at 11/27/13 1122 Last data filed at 11/27/13 0907  Gross per 24 hour  Intake   1375 ml  Output      0 ml  Net   1375 ml    Exam:   General:  Pt is alert, follows commands appropriately, not in acute distress  Cardiovascular: Regular rate and rhythm, S1/S2, no murmurs  Respiratory: Clear to auscultation bilaterally, no wheezing, no crackles, no rhonchi  Abdomen: Soft, non tender, non distended, bowel sounds present  Extremities: No edema, pulses DP and PT palpable bilaterally  Neuro: Grossly nonfocal  Data Reviewed: Basic Metabolic Panel:  Recent Labs Lab 11/26/13 1450 11/27/13 0430  NA 133* 137  K 4.1 4.0  CL 97 103  CO2 18* 19  GLUCOSE 183* 174*  BUN 16 18  CREATININE 0.93 1.01  CALCIUM 9.6 8.4   Liver Function Tests:  Recent Labs Lab 11/26/13 1450  AST 43*  ALT 35  ALKPHOS 124*  BILITOT 1.1  PROT 7.7  ALBUMIN 3.7  No results found for this basename: LIPASE, AMYLASE,  in the last 168 hours No results found for this basename: AMMONIA,  in the last 168 hours CBC:  Recent Labs Lab 11/26/13 1450 11/27/13 0430  WBC 13.4* 10.2  NEUTROABS 10.2*  --   HGB 12.5 12.0  HCT 36.7 35.0*  MCV 87.6 89.7  PLT 185 181   Cardiac Enzymes: No results found for this basename: CKTOTAL, CKMB, CKMBINDEX, TROPONINI,  in the last 168 hours BNP: No components found with this basename: POCBNP,   CBG:  Recent Labs Lab 11/26/13 1520 11/26/13 1822 11/26/13 2155 11/27/13 0730  GLUCAP 165* 141* 173* 159*    CULTURE, BLOOD (ROUTINE X 2)     Status: None   Collection Time    11/26/13  2:50 PM      Result Value Ref Range Status   Specimen Description BLOOD RIGHT FOREARM  5 ML IN [facility] BOTTLE   Final   Value:        BLOOD CULTURE RECEIVED NO GROWTH TO DATE CULTURE WILL BE HELD FOR 5 DAYS BEFORE ISSUING A FINAL NEGATIVE REPORT     Performed at [facility]   Report Status PENDING   Incomplete  CULTURE, BLOOD (ROUTINE X 2)     Status: None   Collection Time    11/26/13  2:50 PM      Result Value Ref Range Status   Specimen Description BLOOD LEFT HAND  3 ML IN EACH BOTTLE   Final   Value:        BLOOD CULTURE RECEIVED NO GROWTH TO DATE CULTURE WILL BE HELD FOR 5 DAYS BEFORE ISSUING A FINAL NEGATIVE REPORT     Performed at [facility]   Report Status PENDING   Incomplete     Scheduled Meds: . amitriptyline  25 mg Oral QHS  . aspirin  81 mg Oral Q M,W,F  . cefTRIAXone   1 g Intravenous Q24H  . cholecalciferol  1,000 Units Oral Daily  . darifenacin  7.5 mg Oral Daily  . enoxaparin (LOVENOX)   40 mg Subcutaneous Q24H  . insulin aspart  0-9 Units Subcutaneous TID WC  . lisinopril  5 mg Oral q morning - 10a  . pantoprazole  40 mg Oral Daily  . phenazopyridine  200 mg Oral TID WC  . primidone  50 mg Oral TID WC   Continuous Infusions: . sodium chloride 100 mL/hr at 11/27/13 0445

## 2013-11-27 NOTE — Progress Notes (Signed)
CRITICAL VALUE ALERT  Critical value received: positive anaerobic blood culture gram neg rods  Date of notification:  11/27/2013  Time of notification:  1245  Critical value read back:Yes.    Nurse who received alert:  Orlene OchB Nelly Scriven RN  MD notified (1st page):  Elisabeth Pigeonevine  Time of first page:  1300  MD notified (2nd page):  Time of second page:  Responding MD:  Elisabeth Pigeonevine  Time MD responded text page sent

## 2013-11-27 NOTE — ED Provider Notes (Signed)
Medical screening examination/treatment/procedure(s) were conducted as a shared visit with non-physician practitioner(s) and myself.  I personally evaluated the patient during the encounter.  Pt w fever, urinary urgency/dysuria, gen weakness, dec appetite. abd soft nt. Iv ns bolus. Labs, urine. Urine cx, rocephin iv. Admit.    Suzi RootsKevin E Aldric Wenzler, MD 11/27/13 [phone]

## 2013-11-28 DIAGNOSIS — E089 Diabetes mellitus due to underlying condition without complications: Secondary | ICD-10-CM

## 2013-11-28 DIAGNOSIS — R7881 Bacteremia: Secondary | ICD-10-CM

## 2013-11-28 LAB — GLUCOSE, CAPILLARY
GLUCOSE-CAPILLARY: 152 mg/dL — AB (ref 70–99)
GLUCOSE-CAPILLARY: 156 mg/dL — AB (ref 70–99)
Glucose-Capillary: 143 mg/dL — ABNORMAL HIGH (ref 70–99)

## 2013-11-28 LAB — URINE CULTURE

## 2013-11-28 LAB — HEMOGLOBIN A1C
HEMOGLOBIN A1C: 7.2 % — AB (ref <5.7)
MEAN PLASMA GLUCOSE: 160 mg/dL — AB (ref <117)

## 2013-11-28 NOTE — Progress Notes (Signed)
Patient ID: Rebecca Flores, female   DOB: May 21, 1940, 73 y.o.   MRN: [id number] [facility] PROGRESS NOTE  Raylene Carmickle ZMO:[id number] DOB: 1940/09/26 DOA: 11/26/2013 PCP: Henrine Screws, MD  Brief narrative: 73 y.o. female with a past medical history of hypertension, diabetes who presented to [facility] ED 11/26/2013 with worsening dysuria, urinary frequency and foul-smelling urine. Patient also reported subjective fevers with associated chills and diaphoresis as well as bilateral flank pain however worse on the right compared to the left. On admission, patient was found to have urinary tract infection based on urinalysis. She was started on Rocephin for treatment of UTI. Blood culture on admission is growing gram-negative rods and Rocephin was switched to Zosyn for better gram-negative coverage.  Assessment/Plan:   Principal Problem:  Sepsis secondary to Escherichia coli urinary tract infection and possible pyelonephritis / Gram-negative bacteremia Sepsis criteria met on the admission considering vital signs that include hypertension, tachycardia, tachypnea, fever. In addition patient had elevated white blood cell count 13.4. Urinalysis was significant for large leukocytes and too numerous to count white blood cells.  Patient had complaints of flank pain on the admission. Along with urinary tract infection this was concerning for possible pyelonephritis. Patient was started on Rocephin on the admission. Urine culture from admission is growing Escherichia coli. Blood cultures on the admission are growing gram-negative rods. Rocephin was stopped 11/27/2013 and we started Zosyn for better gram-negative bacteria coverage.  Repeat blood cultures today to ensure clearing of bacteremia.  Active Problems:  DM (diabetes mellitus), controlled   A1c on this admission is 7.2 indicating relatively decent blood glucose control . Continue sliding scale insulin. Would continue to hold Kombiglyze (has  metformin) because of acute infection. HTN (hypertension)  Continue lisinopril 5 mg daily Essential and other specified forms of tremor  Stable, continue outpatient medications.  DVT Prophylaxis  Lovenox subQ ordered.    Code Status: Full.  Family Communication: plan of care discussed with the patient  Disposition Plan: Home when stable.    IV Access:   Peripheral IV  Procedures and diagnostic studies:    Dg Chest 2 View 11/26/2013 No active cardiopulmonary disease.   Medical Consultants:   None  Other Consultants:   None  Anti-Infectives:   Rocephin 11/26/2013 --> 11/27/2013 Zosyn 11/27/2013 -->   Leisa Lenz, MD  [facility] Pager [phone]  If 7PM-7AM, please contact night-coverage [email or web address] Password [email or web address] 11/28/2013, 12:28 PM   LOS: 2 days    HPI/Subjective: No acute overnight events.  Objective: Filed Vitals:   11/27/13 1400 11/27/13 1500 11/27/13 2153 11/28/13 0558  BP: 119/50 109/38 126/55 135/50  Pulse:  100 71 68  Temp:  100 F (37.8 C) 99.6 F (37.6 C) 99.6 F (37.6 C)  TempSrc:  Oral Oral Oral  Resp:  $Remo'18 20 20  'xlpti$ Height:      Weight:      SpO2:  96% 92% 92%    Intake/Output Summary (Last 24 hours) at 11/28/13 1228 Last data filed at 11/28/13 0900  Gross per 24 hour  Intake   2904 ml  Output    902 ml  Net   2002 ml    Exam:   General:  Pt is alert, not in acute distress  Cardiovascular: Regular rate and rhythm, S1/S2 appreciated   Respiratory: no wheezing, no crackles, no rhonchi  Abdomen: Soft, non tender, non distended, bowel sounds present  Extremities: No edema, pulses DP and PT palpable bilaterally  Neuro: nonfocal  Data Reviewed: Basic Metabolic Panel:  Recent Labs Lab 11/26/13 1450 11/27/13 0430  NA 133* 137  K 4.1 4.0  CL 97 103  CO2 18* 19  GLUCOSE 183* 174*  BUN 16 18  CREATININE 0.93 1.01  CALCIUM 9.6 8.4   Liver Function Tests:  Recent Labs Lab 11/26/13 1450  AST 43*  ALT 35   ALKPHOS 124*  BILITOT 1.1  PROT 7.7  ALBUMIN 3.7   No results found for this basename: LIPASE, AMYLASE,  in the last 168 hours No results found for this basename: AMMONIA,  in the last 168 hours CBC:  Recent Labs Lab 11/26/13 1450 11/27/13 0430  WBC 13.4* 10.2  NEUTROABS 10.2*  --   HGB 12.5 12.0  HCT 36.7 35.0*  MCV 87.6 89.7  PLT 185 181   Cardiac Enzymes: No results found for this basename: CKTOTAL, CKMB, CKMBINDEX, TROPONINI,  in the last 168 hours BNP: No components found with this basename: POCBNP,  CBG:  Recent Labs Lab 11/27/13 1151 11/27/13 1732 11/27/13 2157 11/28/13 0740 11/28/13 1133  GLUCAP 165* 166* 181* 143* 152*    Recent Results (from the past 240 hour(s))  URINE CULTURE     Status: None   Collection Time    11/26/13  2:47 PM      Result Value Ref Range Status   Specimen Description URINE, CATHETERIZED   Final   Special Requests NONE   Final   Culture  Setup Time     Final   Value: 11/26/2013 23:01     Performed at [place]     Final   Value: >=100,000 COLONIES/ML     Performed at [facility]   Culture     Final   Value: ESCHERICHIA COLI     Performed at [facility]   Report Status 11/28/2013 FINAL   Final   Organism ID, Bacteria ESCHERICHIA COLI   Final  CULTURE, BLOOD (ROUTINE X 2)     Status: None   Collection Time    11/26/13  2:50 PM      Result Value Ref Range Status   Specimen Description BLOOD RIGHT FOREARM  5 ML IN [facility] BOTTLE   Final   Special Requests NONE   Final   Culture  Setup Time     Final   Value: 11/26/2013 22:18     Performed at [facility]   Culture     Final   Value: GRAM NEGATIVE RODS     Note: Gram Stain Report Called to,Read Back By and Verified With: CINDY@12 :13PM ON 11/27/13 BY DANTS     Performed at [facility]   Report Status PENDING   Incomplete  CULTURE, BLOOD (ROUTINE X 2)     Status: None   Collection Time    11/26/13  2:50 PM       Result Value Ref Range Status   Specimen Description BLOOD LEFT HAND  3 ML IN [facility] BOTTLE   Final   Special Requests NONE   Final   Culture  Setup Time     Final   Value: 11/26/2013 22:18     Performed at [facility]   Culture     Final   Value: GRAM NEGATIVE RODS     Note: Gram Stain Report Called to,Read Back By and Verified With: Lottie Dawson ON 11/27/2013 AT 72:30P BY Dennard Nip     Performed at [facility]   Report Status PENDING   Incomplete  Scheduled Meds: . amitriptyline  25 mg Oral QHS  . aspirin  81 mg Oral Q M,W,F  . cholecalciferol  1,000 Units Oral Daily  . darifenacin  7.5 mg Oral Daily  . enoxaparin (LOVENOX) injection  40 mg Subcutaneous Q24H  . insulin aspart  0-9 Units Subcutaneous TID WC  . lisinopril  5 mg Oral q morning - 10a  . pantoprazole  40 mg Oral Daily  . phenazopyridine  200 mg Oral TID WC  . piperacillin-tazobactam (ZOSYN)  IV  3.375 g Intravenous 3 times per day  . primidone  50 mg Oral TID WC   Continuous Infusions: . sodium chloride 75 mL/hr at 11/28/13 0757

## 2013-11-28 NOTE — Progress Notes (Signed)
CRITICAL VALUE ALERT  Critical value received:GRAM NEGATIVE RODS  (ANEROBIC BOTTLE)  Date of notification:  11/27/13  Time of notification:  2000  Critical value read back:YES  Nurse who received alert: Beuna Bolding .Iban Utz  MD notified (1st page):K. Craige CottaKIRBY ,NP  Time of first page:  2010  MD notified (2nd page):  Time of second page:  Responding MD: Donnamarie PoagK. KIRBY ,NP  Time MD responded:  2020

## 2013-11-29 DIAGNOSIS — N390 Urinary tract infection, site not specified: Secondary | ICD-10-CM

## 2013-11-29 DIAGNOSIS — A419 Sepsis, unspecified organism: Secondary | ICD-10-CM | POA: Diagnosis present

## 2013-11-29 DIAGNOSIS — E1142 Type 2 diabetes mellitus with diabetic polyneuropathy: Secondary | ICD-10-CM

## 2013-11-29 LAB — CULTURE, BLOOD (ROUTINE X 2)

## 2013-11-29 LAB — GLUCOSE, CAPILLARY
GLUCOSE-CAPILLARY: 132 mg/dL — AB (ref 70–99)
Glucose-Capillary: 151 mg/dL — ABNORMAL HIGH (ref 70–99)
Glucose-Capillary: 151 mg/dL — ABNORMAL HIGH (ref 70–99)

## 2013-11-29 MED ORDER — CIPROFLOXACIN HCL 500 MG PO TABS: 500.0000 mg | ORAL_TABLET | Freq: Two times a day (BID) | ORAL | Status: DC

## 2013-11-29 MED ORDER — CEFAZOLIN SODIUM-DEXTROSE 2-3 GM-% IV SOLR
2.0000 g | Freq: Three times a day (TID) | INTRAVENOUS | Status: DC
  Administered 2013-11-29: 2 g via INTRAVENOUS
  Filled 2013-11-29 (×2): qty 50

## 2013-11-29 NOTE — Care Management Note (Signed)
CARE MANAGEMENT NOTE 11/29/2013  Patient:  Rebecca Flores,Rebecca Flores   Account Number:  [id number]  Date Initiated:  11/29/2013  Documentation initiated by:  Sandford CrazeLEMENTS,Iyan Flett  Subjective/Objective Assessment:   73 yo admitted with Pyelonephritis     Action/Plan:   From home alone   Anticipated DC Date:  11/29/2013   Anticipated DC Plan:  HOME/SELF CARE      DC Planning Services  CM consult      Choice offered to / List presented to:             Status of service:  Completed, signed off Medicare Important Message given?  YES (If response is "NO", the following Medicare IM given date fields will be blank) Date Medicare IM given:  11/29/2013 Medicare IM given by:  Sandford CrazeLEMENTS,Tamanika Heiney Date Additional Medicare IM given:   Additional Medicare IM given by:    Discharge Disposition:    Per UR Regulation:    If discussed at Long Length of Stay Meetings, dates discussed:    Comments:  11/29/13 Sandford CrazeNora Deval Mroczka RN,BSN,NCM

## 2013-11-29 NOTE — Plan of Care (Signed)
Problem: Phase I Progression Outcomes Goal: OOB as tolerated unless otherwise ordered Outcome: Completed/Met Date Met:  11/29/13 Patient ambulating in hallway and room without any problems.

## 2013-11-29 NOTE — Progress Notes (Signed)
ANTIBIOTIC CONSULT NOTE - INITIAL  Pharmacy Consult for cefazolin Indication: UTI and bacteremia  Allergies  Allergen Reactions  . Nsaids     Severe facial swelling/rash   . Atenolol     Hair loss   . Codeine Nausea Only    Nausea   . Primidone     Emotional lability   . Simvastatin     Leg cramps  . Sulfa Antibiotics     Pruritis      Patient Measurements: Height: 5' 5.5" (166.4 cm) Weight: 155 lb (70.308 kg) IBW/kg (Calculated) : 58.15  Estimated Creatinine Clearance: 48.6 ml/min (by C-G formula based on Cr of 1.01).  Medical History: Past Medical History  Diagnosis Date  . Diabetes mellitus   . Tremors of nervous system   . Kidney stone   . Hypertension   . Movement disorder   . Family history of anesthesia complication     sister hard to wake up  . Hypercholesteremia   . Osteoporosis   . Tobacco use   . Bradycardia   . GERD (gastroesophageal reflux disease)   . Colon polyp   . Peripheral neuropathy   . Seasonal allergies     Medications:  Scheduled:  . amitriptyline  25 mg Oral QHS  . aspirin  81 mg Oral Q M,W,F  .  ceFAZolin (ANCEF) IV  2 g Intravenous 3 times per day  . cholecalciferol  1,000 Units Oral Daily  . darifenacin  7.5 mg Oral Daily  . enoxaparin (LOVENOX) injection  40 mg Subcutaneous Q24H  . insulin aspart  0-9 Units Subcutaneous TID WC  . lisinopril  5 mg Oral q morning - 10a  . pantoprazole  40 mg Oral Daily  . phenazopyridine  200 mg Oral TID WC  . primidone  50 mg Oral TID WC   Infusions:  . sodium chloride 75 mL/hr at 11/28/13 0757   Assessment: 73 yoF with dysuria, flank pain. Urine culture 10/4 growing E coli, sensitivities pending. Blood cultures growing 2/2 GNR. Received empiric Ceftriaxone daily x2, pharmacy then consulted to dose Zosyn, now narrowed to cefazolin.   Antiinfectives 10/4 >> ceftriaxone >> 10/5 10/5 >> Zosyn >> 10/7 10/7 >> cefazolin >>  Labs / vitals Tmax: 101.9 on 10/4, afebrile since WBCs:  10.2 on 10/5 Renal: SCr 1.01, Cl ~ 50 ml/min, UOP not accurate, looks low  Microbiology 10/4 blood: 2/2 GNR 10/4 urine: E coli (pansensitive)   Goal of Therapy:  cefazolin per indication and renal function  Plan:  - stop Zosyn - start cefazolin 2g IV q8h at 1400 today - follow-up clinical course, culture results, renal function - follow-up length of therapy  Thank you for the consult.  Ross LudwigJesse Amman Bartel Akers, PharmD, BCPS Pager: [phone] Pharmacy: [phone] 11/29/2013 9:23 AM

## 2013-11-29 NOTE — Progress Notes (Signed)
Discharge instructions reviewed with patient and her daughter using teach back method and they demonstrated understanding.  Patient stable for discharge home.  Assessment unchanged from this am.  Patient understands when to follow up with MD.  Elease EtienneMiller, Ayana Imhof Wayne  11/29/2013  3:06 PM

## 2013-11-29 NOTE — Discharge Summary (Signed)
Physician Discharge Summary  Rebecca Flores ZOX:[id number] DOB: 11/29/1940 DOA: 11/26/2013  PCP: Pearla Dubonnet, MD  Admit date: 11/26/2013 Discharge date: 11/29/2013   Recommendations for Outpatient Follow-Up:   1. Followup with PCP in 3 weeks for hospital followup and to ensure resolution of symptoms.   Discharge Diagnosis:   Principal Problem:    Sepsis secondary to Escherichia coli bacteremia from pyelonephritis Active Problems:    DM (diabetes mellitus)    HTN (hypertension)    High triglycerides    Essential and other specified forms of tremor    Pyelonephritis    Bacteremia due to Escherichia coli   Discharge Condition: Improved.  Diet recommendation: Low sodium, heart healthy.  Carbohydrate-modified.   History of Present Illness:   73 y.o. female with a past medical history of hypertension, diabetes who presented to [facility] ED 11/26/2013 with worsening dysuria, urinary frequency and foul-smelling urine. Patient also reported subjective fevers with associated chills and diaphoresis as well as bilateral flank pain however worse on the right compared to the left. On admission, patient was found to have urinary tract infection based on urinalysis. She was started on Rocephin for treatment of UTI. Blood culture on admission grew gram-negative rods and Rocephin was switched to Zosyn for better gram-negative coverage.  Hospital Course by Problem:   Principal Problem:   Sepsis secondary to Escherichia coli bacteremia from pyelonephritis  Patient was admitted and initially placed on Rocephin. Antibiotic coverage broadened to Zosyn after blood cultures began growing gram-negative rods.  Both blood and urine cultures were positive for Escherichia coli, pansensitive.  The patient's antibiotics were narrowed to Ancef on the date of discharge, case discussed with Dr. Daiva Eves who recommended an additional 7 days of outpatient oral therapy. Although Septra would provide  the narrowest coverage, she has an allergy to this and therefore was discharged home on a seven-day course of Cipro.  Active Problems:   DM (diabetes mellitus)  The patient was treated with sliding scale insulin while in the hospital.  Her hemoglobin A1c is 7.2% indicating good outpatient control.  Resume home regimen at discharge.    HTN (hypertension)  Continue lisinopril daily.    Essential and other specified forms of tremor  Continue Mysoline.   Medical Consultants:    None.   Discharge Exam:   Filed Vitals:   11/29/13 0509  BP: 145/58  Pulse: 64  Temp: 98.7 F (37.1 C)  Resp: 15   Filed Vitals:   11/28/13 0558 11/28/13 1255 11/28/13 2105 11/29/13 0509  BP: 135/50 162/57 152/68 145/58  Pulse: 68 69 63 64  Temp: 99.6 F (37.6 C) 99 F (37.2 C) 99.4 F (37.4 C) 98.7 F (37.1 C)  TempSrc: Oral Oral Oral Oral  Resp: 20 18 16 15   Height:      Weight:    70.308 kg (155 lb)  SpO2: 92% 99% 98% 98%    Gen:  NAD Cardiovascular:  RRR, No M/R/G Respiratory: Lungs CTAB Gastrointestinal: Abdomen soft, NT/ND with normal active bowel sounds. Extremities: No C/E/C   The results of significant diagnostics from this hospitalization (including imaging, microbiology, ancillary and laboratory) are listed below for reference.     Procedures and Diagnostic Studies:   Dg Chest 2 View 11/26/2013 No active cardiopulmonary disease.  Labs:   Basic Metabolic Panel:  Recent Labs Lab 11/26/13 1450 11/27/13 0430  NA 133* 137  K 4.1 4.0  CL 97 103  CO2 18* 19  GLUCOSE 183* 174*  BUN 16 18  CREATININE 0.93 1.01  CALCIUM 9.6 8.4   GFR Estimated Creatinine Clearance: 48.6 ml/min (by C-G formula based on Cr of 1.01). Liver Function Tests:  Recent Labs Lab 11/26/13 1450  AST 43*  ALT 35  ALKPHOS 124*  BILITOT 1.1  PROT 7.7  ALBUMIN 3.7   CBC:  Recent Labs Lab 11/26/13 1450 11/27/13 0430  WBC 13.4* 10.2  NEUTROABS 10.2*  --   HGB 12.5 12.0  HCT  36.7 35.0*  MCV 87.6 89.7  PLT 185 181   CBG:  Recent Labs Lab 11/28/13 1133 11/28/13 1717 11/28/13 2102 11/29/13 0717 11/29/13 1108  GLUCAP 152* 156* 151* 151* 132*   Hgb A1c  Recent Labs  11/28/13 0102  HGBA1C 7.2*   Microbiology Recent Results (from the past 240 hour(s))  URINE CULTURE     Status: None   Collection Time    11/26/13  2:47 PM      Result Value Ref Range Status   Specimen Description URINE, CATHETERIZED   Final   Special Requests NONE   Final   Culture  Setup Time     Final   Value: 11/26/2013 23:01     [facility]formed at [facility]   Colony Count     Final   Value: >=100,000 COLONIES/ML     [facility]formed [facility]   Culture     Final   Value: ESCHERICHIA COLI     Performed at [facility]   Report Status 11/28/2013 FINAL   Final   Organism ID, Bacteria ESCHERICHIA COLI   Final  CULTURE, BLOOD (ROUTINE X 2)     Status: None   Collection Time    11/26/13  2:50 PM      Result Value Ref Range Status   Specimen Description BLOOD RIGHT FOREARM  5 [facility]   Final   Special Requests NONE   Final   Culture  Setup Time     Final   Value: 11/26/2013 22:18     Performed at [facility]   Culture     Final   Value: ESCHERICHIA COLI     Note: Gram Stain Report Called to,Read Back By and Verified With: CINDY@12 :13PM ON 11/27/13 BY DANTS     Performed at [facility]   Report Status 11/29/2013 FINAL   Final   Organism ID, Bacteria ESCHERICHIA COLI   Final  CULTURE, BLOOD (ROUTINE X 2)     Status: None   Collection Time    11/26/13  2:50 PM      Result Value Ref Range Status   Specimen Description BLOOD LEFT HAND  3 [facility]   Final   Special Requests NONE   Final   Culture  Setup Time     Final   Value: 11/26/2013 22:18     Performed at [facility]   Culture     Final   Value: ESCHERICHIA COLI     Note: SUSCEPTIBILITIES PERFORMED ON PREVIOUS CULTURE WITHIN THE LAST 5 DAYS.     Note:  Gram Stain Report Called to,Read Back By and Verified With: EKUA ACQUAAH ON 11/27/2013 AT 7:30P BY Serafina MitchellWILEJ     Performed at [facility]   Report Status 11/29/2013 FINAL   Final  CULTURE, BLOOD (ROUTINE X 2)     Status: None   Collection Time    11/28/13  9:50 AM      Result Value Ref Range Status  Specimen Description BLOOD LEFT ARM   Final   Special Requests BOTTLES DRAWN AEROBIC AND ANAEROBIC 5CC   Final   Culture  Setup Time     Final   Value: 11/28/2013 13:08     Performed at [facility]   Culture     Final   Value:        BLOOD CULTURE RECEIVED NO GROWTH TO DATE CULTURE WILL BE HELD FOR 5 DAYS BEFORE ISSUING A FINAL NEGATIVE REPORT     Performed at [facility]   Report Status PENDING   Incomplete  CULTURE, BLOOD (ROUTINE X 2)     Status: None   Collection Time    11/28/13  9:55 AM      Result Value Ref Range Status   Specimen Description BLOOD LEFT HAND   Final   Special Requests BOTTLES DRAWN AEROBIC ONLY 2CC   Final   Culture  Setup Time     Final   Value: 11/28/2013 13:07     Performed at [facility]   Culture     Final   Value:        BLOOD CULTURE RECEIVED NO GROWTH TO DATE CULTURE WILL BE HELD FOR 5 DAYS BEFORE ISSUING A FINAL NEGATIVE REPORT     Note: Culture results may be compromised due to an inadequate volume of blood received in culture bottles.     Performed at [facility]   Report Status PENDING   Incomplete     Discharge Instructions:       Discharge Instructions   Call MD for:  persistant nausea and vomiting    Complete by:  As directed      Call MD for:  severe uncontrolled pain    Complete by:  As directed      Call MD for:  temperature >100.4    Complete by:  As directed      Diet - low sodium heart healthy    Complete by:  As directed      Diet Carb Modified    Complete by:  As directed      Discharge instructions    Complete by:  As directed   You were cared for by Dr. Hillery Aldo  (a hospitalist)  during your hospital stay. If you have any questions about your discharge medications or the care you received while you were in the hospital after you are discharged, you can call the unit and ask to speak with the hospitalist on call if the hospitalist that took care of you is not available. Once you are discharged, your primary care physician will handle any further medical issues. Please note that NO REFILLS for any discharge medications will be authorized once you are discharged, as it is imperative that you return to your primary care physician (or establish a relationship with a primary care physician if you do not have one) for your aftercare needs so that they can reassess your need for medications and monitor your lab values.  Any outstanding tests can be reviewed by your PCP at your follow up visit.  It is also important to review any medicine changes with your PCP.  Please bring these d/c instructions with you to your next visit so your physician can review these changes with you.  .     Increase activity slowly    Complete by:  As directed             Medication List  amitriptyline 25 MG tablet  Commonly known as:  ELAVIL  Take 25 mg by mouth at bedtime.     aspirin 81 MG chewable tablet  Chew 81 mg by mouth every Monday, Wednesday, and Friday.     cholecalciferol 1000 UNITS tablet  Commonly known as:  VITAMIN D  Take 1,000 Units by mouth daily.     Cinnamon 500 MG Tabs  Take 1 tablet by mouth every evening.     ciprofloxacin 500 MG tablet  Commonly known as:  CIPRO  Take 1 tablet (500 mg total) by mouth 2 (two) times daily.     KOMBIGLYZE XR 2.06-998 MG Tb24  Generic drug:  Saxagliptin-Metformin  Take 1 tablet by mouth every evening.     lisinopril 5 MG tablet  Commonly known as:  PRINIVIL,ZESTRIL  Take 5 mg by mouth every morning.     loratadine 10 MG tablet  Commonly known as:  CLARITIN  Take 10 mg by mouth daily as needed for allergies.      omeprazole 20 MG capsule  Commonly known as:  PRILOSEC  Take 20 mg by mouth every morning.     Phenazopyridine HCl 97.5 MG Tabs  Take 2 tablets by mouth 3 (three) times daily.     primidone 50 MG tablet  Commonly known as:  MYSOLINE  Take 1 tablet (50 mg total) by mouth 3 (three) times daily.     solifenacin 5 MG tablet  Commonly known as:  VESICARE  Take 5 mg by mouth daily.       Follow-up Information   Follow up with GATES,ROBERT NEVILL, MD. Schedule an appointment as soon as possible for a visit today. [facility] follow up.)    Specialty:  Internal Medicine   Contact information:   [address] [place] [address] [phone]        Time coordinating discharge: 35 minutes.  Signed:  RAMA,CHRISTINA  Pager [phone] [facility] 11/29/2013, 4:39 PM

## 2013-11-29 NOTE — Discharge Instructions (Signed)
Bacteremia Bacteremia occurs when bacteria get in your blood. Normal blood does not usually have bacteria. Bacteremia is one way infections can spread from one part of the body to another. CAUSES   Causes may include anything that allows bacteria to get into the body. Examples are: Pyelonephritis, Adult Pyelonephritis is a kidney infection. In general, there are 2 main types of pyelonephritis: Infections that come on quickly without any warning (acute pyelonephritis). Infections that persist for a long period of time (chronic pyelonephritis). CAUSES  Two main causes of pyelonephritis are: Bacteria traveling from the bladder to the kidney. This is a problem especially in pregnant women. The urine in the bladder can become filled with bacteria from multiple causes, including: Inflammation of the prostate gland (prostatitis). Sexual intercourse in females. Bladder infection (cystitis). Bacteria traveling from the bloodstream to the tissue part of the kidney. Problems that may increase your risk of getting a kidney infection include: Diabetes. Kidney stones or bladder stones. Cancer. Catheters placed in the bladder. Other abnormalities of the kidney or ureter. SYMPTOMS  Abdominal pain. Pain in the side or flank area. Fever. Chills. Upset stomach. Blood in the urine (dark urine). Frequent urination. Strong or persistent urge to urinate. Burning or stinging when urinating. DIAGNOSIS  Your caregiver may diagnose your kidney infection based on your symptoms. A urine sample may also be taken. TREATMENT  In general, treatment depends on how severe the infection is.  If the infection is mild and caught early, your caregiver may treat you with oral antibiotics and send you home. If the infection is more severe, the bacteria may have gotten into the bloodstream. This will require intravenous (IV) antibiotics and a hospital stay. Symptoms may include: High fever. Severe flank pain. Shaking  chills. Even after a hospital stay, your caregiver may require you to be on oral antibiotics for a period of time. Other treatments may be required depending upon the cause of the infection. HOME CARE INSTRUCTIONS  Take your antibiotics as directed. Finish them even if you start to feel better. Make an appointment to have your urine checked to make sure the infection is gone. Drink enough fluids to keep your urine clear or pale yellow. Take medicines for the bladder if you have urgency and frequency of urination as directed by your caregiver. SEEK IMMEDIATE MEDICAL CARE IF:  You have a fever or persistent symptoms for more than 2-3 days. You have a fever and your symptoms suddenly get worse. You are unable to take your antibiotics or fluids. You develop shaking chills. You experience extreme weakness or fainting. There is no improvement after 2 days of treatment. MAKE SURE YOU: Understand these instructions. Will watch your condition. Will get help right away if you are not doing well or get worse. Document Released: 02/09/2005 Document Revised: 08/11/2011 Document Reviewed: 07/16/2010 [facility] Patient Information 2015 [place], [place]. This information is not intended to replace advice given to you by your health care provider. Make sure you discuss any questions you have with your health care provider.   Catheters.  Intravenous (IV) access tubes.  Cuts or scrapes of the skin.  Temporary bacteremia may occur during dental procedures, while brushing your teeth, or during a bowel movement. This rarely causes any symptoms or medical problems.  Bacteria may also get in the bloodstream as a complication of a bacterial infection elsewhere. This includes infected wounds and bacterial infections of the:  Lungs (pneumonia).  Kidneys (pyelonephritis).  Intestines (enteritis, colitis).  Organs in the abdomen (appendicitis,  cholecystitis, diverticulitis). SYMPTOMS  The body is usually  able to clear small numbers of bacteria out of the blood quickly. Brief bacteremia usually does not cause problems.   Problems can occur if the bacteria start to grow in number or spread to other parts of the body. If the bacteria start growing, you may develop:  Chills.  Fever.  Nausea.  Vomiting.  Sweating.  Lightheadedness and low blood pressure.  Pain.  If bacteria start to grow in the linings around the brain, it is called meningitis. This can cause severe headaches, many other problems, and even death.  If bacteria start to grow in a joint, it causes arthritis with painful joints. If bacteria start to grow in a bone, it is called osteomyelitis.  Bacteria from the blood can also cause sores (abscesses) in many organs, such as the muscle, liver, spleen, lungs, brain, and kidneys. DIAGNOSIS   This condition is diagnosed by cultures of the blood.  Cultures may also be taken from other parts of the body that are thought to be causing the bacteremia. A small piece of tissue, fluid, or other product of the body is sampled. The sample is then put on a growth plate to see if any bacteria grows.  Other lab tests may be done and the results may be abnormal. TREATMENT  Treatment requires a stay in the hospital. You will be given antibiotic medicine through an IV access tube. PREVENTION  People with an increased risk of developing bacteremia or complications may be given antibiotics before certain procedures. Examples are:  A person with a heart murmur or artificial heart valve, before having his or her teeth cleaned.  Before having a surgical or other invasive procedure.  Before having a bowel procedure. Document Released: 11/23/2005 Document Revised: 05/04/2011 Document Reviewed: 09/04/2010 [facility] Patient Information 2015 [place]. This information is not intended to replace advice given to you by your health care provider. Make sure you discuss any questions you have  with your health care provider.

## 2013-12-04 LAB — CULTURE, BLOOD (ROUTINE X 2)
CULTURE: NO GROWTH
Culture: NO GROWTH

## 2014-01-10 ENCOUNTER — Encounter: Payer: Self-pay | Admitting: Neurology

## 2014-01-16 ENCOUNTER — Encounter: Payer: Self-pay | Admitting: Neurology

## 2014-04-17 ENCOUNTER — Other Ambulatory Visit: Payer: Self-pay

## 2014-04-17 DIAGNOSIS — Z1231 Encounter for screening mammogram for malignant neoplasm of breast: Secondary | ICD-10-CM

## 2014-04-25 ENCOUNTER — Telehealth: Payer: Self-pay | Admitting: Nurse Practitioner

## 2014-04-25 MED ORDER — PRIMIDONE 50 MG PO TABS: 50.0000 mg | ORAL_TABLET | Freq: Three times a day (TID) | ORAL | Status: DC

## 2014-04-25 NOTE — Telephone Encounter (Signed)
Patient is calling to get a new Rx for primidone (MYSOLINE) 50 MG tablet #90 called to [facility] on [address]. Patient's insurance says it is ok to fill for 3 months. Thank you.

## 2014-05-21 ENCOUNTER — Ambulatory Visit
Admission: RE | Admit: 2014-05-21 | Discharge: 2014-05-21 | Disposition: A | Discharge status: Home / Self Care | Payer: Medicare Other | Source: Ambulatory Visit

## 2014-05-21 DIAGNOSIS — Z1231 Encounter for screening mammogram for malignant neoplasm of breast: Secondary | ICD-10-CM

## 2014-06-13 ENCOUNTER — Emergency Department (HOSPITAL_COMMUNITY)
Admission: EM | Admit: 2014-06-13 | Discharge: 2014-06-13 | Disposition: A | Discharge status: Home / Self Care | Payer: Medicare Other | Attending: Emergency Medicine | Admitting: Emergency Medicine

## 2014-06-13 ENCOUNTER — Emergency Department (HOSPITAL_COMMUNITY): Payer: Medicare Other

## 2014-06-13 ENCOUNTER — Encounter (HOSPITAL_COMMUNITY): Payer: Self-pay

## 2014-06-13 DIAGNOSIS — E119 Type 2 diabetes mellitus without complications: Secondary | ICD-10-CM | POA: Diagnosis not present

## 2014-06-13 DIAGNOSIS — Z79899 Other long term (current) drug therapy: Secondary | ICD-10-CM | POA: Diagnosis not present

## 2014-06-13 DIAGNOSIS — K219 Gastro-esophageal reflux disease without esophagitis: Secondary | ICD-10-CM | POA: Diagnosis not present

## 2014-06-13 DIAGNOSIS — N390 Urinary tract infection, site not specified: Secondary | ICD-10-CM | POA: Diagnosis not present

## 2014-06-13 DIAGNOSIS — Z86010 Personal history of colonic polyps: Secondary | ICD-10-CM | POA: Insufficient documentation

## 2014-06-13 DIAGNOSIS — R197 Diarrhea, unspecified: Secondary | ICD-10-CM

## 2014-06-13 DIAGNOSIS — E860 Dehydration: Secondary | ICD-10-CM | POA: Diagnosis not present

## 2014-06-13 DIAGNOSIS — Z87891 Personal history of nicotine dependence: Secondary | ICD-10-CM | POA: Diagnosis not present

## 2014-06-13 DIAGNOSIS — R1110 Vomiting, unspecified: Secondary | ICD-10-CM

## 2014-06-13 DIAGNOSIS — Z87442 Personal history of urinary calculi: Secondary | ICD-10-CM | POA: Diagnosis not present

## 2014-06-13 DIAGNOSIS — M810 Age-related osteoporosis without current pathological fracture: Secondary | ICD-10-CM | POA: Insufficient documentation

## 2014-06-13 DIAGNOSIS — Z8669 Personal history of other diseases of the nervous system and sense organs: Secondary | ICD-10-CM | POA: Diagnosis not present

## 2014-06-13 DIAGNOSIS — Z7982 Long term (current) use of aspirin: Secondary | ICD-10-CM | POA: Diagnosis not present

## 2014-06-13 DIAGNOSIS — E780 Pure hypercholesterolemia: Secondary | ICD-10-CM | POA: Diagnosis not present

## 2014-06-13 DIAGNOSIS — R05 Cough: Secondary | ICD-10-CM | POA: Insufficient documentation

## 2014-06-13 DIAGNOSIS — I10 Essential (primary) hypertension: Secondary | ICD-10-CM | POA: Insufficient documentation

## 2014-06-13 DIAGNOSIS — R059 Cough, unspecified: Secondary | ICD-10-CM

## 2014-06-13 LAB — BASIC METABOLIC PANEL
ANION GAP: 14 (ref 5–15)
BUN: 34 mg/dL — ABNORMAL HIGH (ref 6–23)
CHLORIDE: 99 mmol/L (ref 96–112)
CO2: 19 mmol/L (ref 19–32)
CREATININE: 1.27 mg/dL — AB (ref 0.50–1.10)
Calcium: 8.8 mg/dL (ref 8.4–10.5)
GFR, EST AFRICAN AMERICAN: 47 mL/min — AB (ref >90)
GFR, EST NON AFRICAN AMERICAN: 40 mL/min — AB (ref >90)
Glucose, Bld: 253 mg/dL — ABNORMAL HIGH (ref 70–99)
Potassium: 3.8 mmol/L (ref 3.5–5.1)
Sodium: 132 mmol/L — ABNORMAL LOW (ref 135–145)

## 2014-06-13 LAB — URINALYSIS, ROUTINE W REFLEX MICROSCOPIC
BILIRUBIN URINE: NEGATIVE
Glucose, UA: NEGATIVE mg/dL
KETONES UR: 15 mg/dL — AB
NITRITE: POSITIVE — AB
Protein, ur: 30 mg/dL — AB
Specific Gravity, Urine: 1.011 (ref 1.005–1.030)
UROBILINOGEN UA: 0.2 mg/dL (ref 0.0–1.0)
pH: 5.5 (ref 5.0–8.0)

## 2014-06-13 LAB — CBC WITH DIFFERENTIAL/PLATELET
BASOS ABS: 0 10*3/uL (ref 0.0–0.1)
BASOS PCT: 0 % (ref 0–1)
Eosinophils Absolute: 0 10*3/uL (ref 0.0–0.7)
Eosinophils Relative: 0 % (ref 0–5)
HEMATOCRIT: 32.7 % — AB (ref 36.0–46.0)
Hemoglobin: 10.9 g/dL — ABNORMAL LOW (ref 12.0–15.0)
Lymphocytes Relative: 8 % — ABNORMAL LOW (ref 12–46)
Lymphs Abs: 1.1 10*3/uL (ref 0.7–4.0)
MCH: 29.5 pg (ref 26.0–34.0)
MCHC: 33.3 g/dL (ref 30.0–36.0)
MCV: 88.4 fL (ref 78.0–100.0)
MONO ABS: 1.8 10*3/uL — AB (ref 0.1–1.0)
Monocytes Relative: 13 % — ABNORMAL HIGH (ref 3–12)
NEUTROS ABS: 11 10*3/uL — AB (ref 1.7–7.7)
NEUTROS PCT: 79 % — AB (ref 43–77)
Platelets: 216 10*3/uL (ref 150–400)
RBC: 3.7 MIL/uL — AB (ref 3.87–5.11)
RDW: 12.7 % (ref 11.5–15.5)
WBC: 14 10*3/uL — ABNORMAL HIGH (ref 4.0–10.5)

## 2014-06-13 LAB — CBG MONITORING, ED: Glucose-Capillary: 250 mg/dL — ABNORMAL HIGH (ref 70–99)

## 2014-06-13 LAB — URINE MICROSCOPIC-ADD ON

## 2014-06-13 LAB — TROPONIN I: Troponin I: 0.03 ng/mL (ref <0.031)

## 2014-06-13 MED ORDER — SODIUM CHLORIDE 0.9 % IV BOLUS (SEPSIS)
1000.0000 mL | Freq: Once | INTRAVENOUS | Status: AC
  Administered 2014-06-13: 1000 mL via INTRAVENOUS

## 2014-06-13 MED ORDER — DEXTROSE 5 % IV SOLN
1.0000 g | Freq: Once | INTRAVENOUS | Status: AC
  Administered 2014-06-13: 1 g via INTRAVENOUS
  Filled 2014-06-13: qty 10

## 2014-06-13 MED ORDER — ALBUTEROL SULFATE HFA 108 (90 BASE) MCG/ACT IN AERS: 1.0000 | INHALATION_SPRAY | Freq: Four times a day (QID) | RESPIRATORY_TRACT | Status: DC | PRN

## 2014-06-13 MED ORDER — ONDANSETRON HCL 4 MG/2ML IJ SOLN
4.0000 mg | Freq: Once | INTRAMUSCULAR | Status: AC
  Administered 2014-06-13: 4 mg via INTRAVENOUS
  Filled 2014-06-13: qty 2

## 2014-06-13 MED ORDER — CEPHALEXIN 500 MG PO CAPS: 500.0000 mg | ORAL_CAPSULE | Freq: Four times a day (QID) | ORAL | Status: DC

## 2014-06-13 MED ORDER — SODIUM CHLORIDE 0.9 % IV BOLUS (SEPSIS)
1000.0000 mL | Freq: Once | INTRAVENOUS | Status: AC
Start: 2014-06-13 — End: 2014-06-13
  Administered 2014-06-13: 1000 mL via INTRAVENOUS

## 2014-06-13 NOTE — ED Provider Notes (Signed)
CSN: [id number]     Arrival date & time 06/13/14  5621 History   First MD Initiated Contact with Patient 06/13/14 [phone]     Chief Complaint  Patient presents with  . Emesis  . Cough    Patient is a 74 y.o. female presenting with vomiting and cough. The history is provided by the patient.  Emesis Severity:  Moderate Duration:  1 week Timing:  Intermittent Progression:  Worsening Chronicity:  New Relieved by:  Nothing Worsened by:  Liquids Associated symptoms: chills, cough, diarrhea and fever   Associated symptoms: no abdominal pain   Risk factors: no travel to endemic areas   Cough Associated symptoms: chills   Associated symptoms: no chest pain    Pt reports she has been "sick" for one week She reports vomiting every day Also reports diarrhea each day No blood in vomitus/stool She reports cough and mild SOB but no CP No abd pain She reports fever/chills  Past Medical History  Diagnosis Date  . Diabetes mellitus   . Tremors of nervous system   . Kidney stone   . Hypertension   . Movement disorder   . Family history of anesthesia complication     sister hard to wake up  . Hypercholesteremia   . Osteoporosis   . Tobacco use   . Bradycardia   . GERD (gastroesophageal reflux disease)   . Colon polyp   . Peripheral neuropathy   . Seasonal allergies    Past Surgical History  Procedure Laterality Date  . Abdominal hysterectomy    . Cholecystectomy  1998  . Appendectomy    . Tonsillectomy    . Mouth surgery    . Colonoscopy    . Inguinal hernia repair Right 06/27/2013    Procedure: RIGHT INGUINAL HERNIA REPAIR WITH MESH;  Surgeon: Velora Heckler, MD;  Location: [facility];  Service: General;  Laterality: Right;  . Insertion of mesh Right 06/27/2013    Procedure: INSERTION OF MESH;  Surgeon: Velora Heckler, MD;  Location: [facility];  Service: General;  Laterality: Right;  . Hernia repair  05/2013  . Bladder tacking     Family History   Problem Relation Age of Onset  . Dementia Mother   . Diabetes Mellitus II Father   . Prostate cancer Father   . Breast cancer Sister   . Dementia Sister    History  Substance Use Topics  . Smoking status: Former Smoker    Types: Cigarettes    Quit date: 04/23/2009  . Smokeless tobacco: Never Used  . Alcohol Use: No   OB History    No data available     Review of Systems  Constitutional: Positive for chills.  Respiratory: Positive for cough.   Cardiovascular: Negative for chest pain.  Gastrointestinal: Positive for vomiting and diarrhea. Negative for abdominal pain and blood in stool.  Neurological: Negative for syncope.  All other systems reviewed and are negative.     Allergies  Nsaids; Atenolol; Codeine; Primidone; Simvastatin; and Sulfa antibiotics  Home Medications   Prior to Admission medications   Medication Sig Start Date End Date Taking? Authorizing Provider  amitriptyline (ELAVIL) 25 MG tablet Take 25 mg by mouth at bedtime.    Historical Provider, MD  aspirin 81 MG chewable tablet Chew 81 mg by mouth every Monday, Wednesday, and Friday.     Historical Provider, MD  cholecalciferol (VITAMIN D) 1000 UNITS tablet Take 1,000 Units by mouth daily.  Historical Provider, MD  Cinnamon 500 MG TABS Take 1 tablet by mouth every evening.     Historical Provider, MD  lisinopril (PRINIVIL,ZESTRIL) 5 MG tablet Take 5 mg by mouth every morning.  08/06/12   Historical Provider, MD  loratadine (CLARITIN) 10 MG tablet Take 10 mg by mouth daily as needed for allergies.     Historical Provider, MD  omeprazole (PRILOSEC) 20 MG capsule Take 20 mg by mouth every morning.    Historical Provider, MD  Phenazopyridine HCl 97.5 MG TABS Take 2 tablets by mouth 3 (three) times daily.    Historical Provider, MD  Saxagliptin-Metformin (KOMBIGLYZE XR) 2.06-998 MG TB24 Take 1 tablet by mouth every evening.    Historical Provider, MD  solifenacin (VESICARE) 5 MG tablet Take 5 mg by mouth  daily.     Historical Provider, MD   BP 121/55 mmHg  Pulse 104  Temp(Src) 98.9 F (37.2 C) (Oral)  Resp 18  Ht 5' 5.5" (1.664 m)  Wt 143 lb (64.864 kg)  BMI 23.43 kg/m2  SpO2 93% Physical Exam CONSTITUTIONAL: Well developed/well nourished HEAD: Normocephalic/atraumatic EYES: EOMI/PERRL ENMT: Mucous membranes dry NECK: supple no meningeal signs SPINE/BACK:entire spine nontender CV: S1/S2 noted, no murmurs/rubs/gallops noted LUNGS: Lungs are clear to auscultation bilaterally, no apparent distress ABDOMEN: soft, nontender, no rebound or guarding, bowel sounds noted throughout abdomen GU:no cva tenderness NEURO: Pt is awake/alert/appropriate, moves all extremitiesx4.  No facial droop.   EXTREMITIES: pulses normal/equal, full ROM SKIN: warm, color normal PSYCH: no abnormalities of mood noted, alert and oriented to situation  ED Course  Procedures  Medications  ondansetron (ZOFRAN) injection 4 mg (4 mg Intravenous Given 06/13/14 0913)  sodium chloride 0.9 % bolus 1,000 mL (0 mLs Intravenous Stopped 06/13/14 1041)  sodium chloride 0.9 % bolus 1,000 mL (0 mLs Intravenous Stopped 06/13/14 1340)  cefTRIAXone (ROCEPHIN) 1 g in dextrose 5 % 50 mL IVPB (1 g Intravenous New Bag/Given 06/13/14 1339)    Labs Review Labs Reviewed  CBC WITH DIFFERENTIAL/PLATELET - Abnormal; Notable for the following:    WBC 14.0 (*)    RBC 3.70 (*)    Hemoglobin 10.9 (*)    HCT 32.7 (*)    Neutrophils Relative % 79 (*)    Neutro Abs 11.0 (*)    Lymphocytes Relative 8 (*)    Monocytes Relative 13 (*)    Monocytes Absolute 1.8 (*)    All other components within normal limits  BASIC METABOLIC PANEL - Abnormal; Notable for the following:    Sodium 132 (*)    Glucose, Bld 253 (*)    BUN 34 (*)    Creatinine, Ser 1.27 (*)    GFR calc non Af Amer 40 (*)    GFR calc Af Amer 47 (*)    All other components within normal limits  URINALYSIS, ROUTINE W REFLEX MICROSCOPIC - Abnormal; Notable for the following:     APPearance CLOUDY (*)    Hgb urine dipstick SMALL (*)    Ketones, ur 15 (*)    Protein, ur 30 (*)    Nitrite POSITIVE (*)    Leukocytes, UA MODERATE (*)    All other components within normal limits  URINE MICROSCOPIC-ADD ON - Abnormal; Notable for the following:    Squamous Epithelial / LPF FEW (*)    Bacteria, UA MANY (*)    Casts GRANULAR CAST (*)    All other components within normal limits  CBG MONITORING, ED - Abnormal; Notable for the following:  Glucose-Capillary 250 (*)    All other components within normal limits  TROPONIN I    Imaging Review Dg Chest 2 View  06/13/2014   CLINICAL DATA:  Cough.  Vomiting.  EXAM: CHEST  2 VIEW  COMPARISON:  11/26/2013  FINDINGS: There is peribronchial thickening. No infiltrates or effusions. Heart size and vascularity are normal. No osseous abnormality.  IMPRESSION: Bronchitic changes.   Electronically Signed   By: Francene BoyersJames  Maxwell M.D.   On: 06/13/2014 10:21     EKG Interpretation   Date/Time:  Wednesday June 13 2014 08:50:50 EDT Ventricular Rate:  94 PR Interval:  136 QRS Duration: 91 QT Interval:  349 QTC Calculation: 436 R Axis:   50 Text Interpretation:  Sinus rhythm Borderline T abnormalities, diffuse  leads Baseline wander in lead(s) I II aVR artifact noted No significant  change since last tracing Confirmed by Bebe ShaggyWICKLINE  MD, Ardyce Heyer ([id number]) on  06/13/2014 9:06:30 AM       Pt improved She is ambulatory UTI noted She is well appearing and appropriate for d/c home BP 135/60 mmHg  Pulse 72  Temp(Src) 98.9 F (37.2 C) (Oral)  Resp 20  Ht 5' 5.5" (1.664 m)  Wt 143 lb (64.864 kg)  BMI 23.43 kg/m2  SpO2 97%   MDM   Final diagnoses:  Dehydration  UTI (lower urinary tract infection)  Cough  Vomiting and diarrhea    Nursing notes including past medical history and social history reviewed and considered in documentation Labs/vital reviewed myself and considered during evaluation xrays/imaging reviewed by myself  and considered during evaluation     Zadie Rhineonald Karelly Dewalt, MD 06/13/14 1414

## 2014-06-13 NOTE — ED Notes (Signed)
Pt states vomiting since last Wednesday.  Unable to keep anything down.  Is a diabetic.  Unable to take her meds.  CBG in 200's.  Pt went to primary MD and told she has a virus.  Pt states "something has to be done".   Pt also states she has had a cough and is weak.

## 2014-06-13 NOTE — ED Notes (Signed)
AWARE OF NEED FOR URINE SAMPLE 

## 2014-09-05 ENCOUNTER — Encounter: Payer: Self-pay | Admitting: Neurology

## 2014-09-05 ENCOUNTER — Ambulatory Visit (INDEPENDENT_AMBULATORY_CARE_PROVIDER_SITE_OTHER): Payer: Medicare Other | Admitting: Neurology

## 2014-09-05 VITALS — BP 132/68 | HR 72 | Ht 65.5 in | Wt 143.0 lb

## 2014-09-05 DIAGNOSIS — G250 Essential tremor: Secondary | ICD-10-CM

## 2014-09-05 DIAGNOSIS — G252 Other specified forms of tremor: Principal | ICD-10-CM

## 2014-09-05 DIAGNOSIS — R251 Tremor, unspecified: Secondary | ICD-10-CM | POA: Diagnosis not present

## 2014-09-05 DIAGNOSIS — G629 Polyneuropathy, unspecified: Secondary | ICD-10-CM | POA: Diagnosis not present

## 2014-09-05 MED ORDER — CAPSAICIN 0.1 % EX CREA: TOPICAL_CREAM | CUTANEOUS | Status: DC

## 2014-09-05 MED ORDER — PREGABALIN 50 MG PO CAPS: 50.0000 mg | ORAL_CAPSULE | Freq: Every day | ORAL | Status: DC

## 2014-09-05 MED ORDER — PRIMIDONE 50 MG PO TABS: 50.0000 mg | ORAL_TABLET | Freq: Three times a day (TID) | ORAL | Status: DC

## 2014-09-05 NOTE — Progress Notes (Signed)
Chief Complaint  Patient presents with  . Tremors    She is here for her yearly follow up. She still has some mild tremors but feels primidone three times daily is helpful.     [facility]  PATIENT: Rebecca Flores DOB: [birth year]   REASON FOR VISIT: follow up for tremor   HISTORY OF PRESENT ILLNESS:Rebecca Flores, 74 year old white female returns for followup for essential tremor. She was last seen in our office 09/05/2012. She has been followed here since June 2009 for tremor felt to represent essential tremor, and previously followed by Dr. Thad Rangereynolds. Tremor also involves the head and to a lesser extent the hands. She has been treated with primidone with improvement. She is independent in all activities of daily living and she drives a car without problems.Her father suffered Parkinson's disease, mother died of Alzheimer's disease, there was no tremors in her siblings, she is now taking primidone 50 mg three times daily at 830, 1230 and 7:30 pm.She denies significant side effects from the medications.   UPDATE September 05 2014: She was diagnosed with diabetes around 2014, she now complains of bilateral toes, feet paresthesia, burning discomfort, difficulty falling to sleep because of her feet pain,   She has tried gabapentin without helping her symptoms, she denies gait difficulty  REVIEW OF SYSTEMS: Full 14 system review of systems performed and notable only for those listed, all others are neg:  As above  ALLERGIES: Allergies  Allergen Reactions  . Nsaids     Severe facial swelling/rash   . Atenolol     Hair loss   . Codeine Nausea Only    Nausea   . Primidone     Emotional lability   . Simvastatin     Leg cramps  . Sulfa Antibiotics     Pruritis      HOME MEDICATIONS: Outpatient Prescriptions Prior to Visit  Medication Sig Dispense Refill  . aspirin EC 81 MG tablet Take 81 mg by mouth 3 (three) times a week. Monday, Wednesday, Friday.    Marland Kitchen.  lisinopril (PRINIVIL,ZESTRIL) 5 MG tablet Take 5 mg by mouth every morning.     . loratadine (CLARITIN) 10 MG tablet Take 10 mg by mouth daily as needed for allergies (congestion).     . Multiple Vitamin (MULTIVITAMIN WITH MINERALS) TABS tablet Take 1 tablet by mouth daily.    Marland Kitchen. omeprazole (PRILOSEC) 20 MG capsule Take 20 mg by mouth every other day.     . primidone (MYSOLINE) 50 MG tablet Take 50 mg by mouth 3 (three) times daily. Indication: essential tremors    . Saxagliptin-Metformin (KOMBIGLYZE XR) 2.06-998 MG TB24 Take 1 tablet by mouth daily with supper.     Marland Kitchen. albuterol (PROVENTIL HFA;VENTOLIN HFA) 108 (90 BASE) MCG/ACT inhaler Inhale 1-2 puffs into the lungs every 6 (six) hours as needed for wheezing or shortness of breath. 1 Inhaler 0  . amitriptyline (ELAVIL) 25 MG tablet Take 25 mg by mouth at bedtime.    . Calcium Carb-Cholecalciferol 600-800 MG-UNIT TABS Take 1 tablet by mouth daily.    . cephALEXin (KEFLEX) 500 MG capsule Take 1 capsule (500 mg total) by mouth 4 (four) times daily. 40 capsule 0   No facility-administered medications prior to visit.    PAST MEDICAL HISTORY: Past Medical History  Diagnosis Date  . Diabetes mellitus   . Tremors of nervous system   . Kidney stone   . Hypertension   . Movement disorder   . Family history of  anesthesia complication     sister hard to wake up  . Hypercholesteremia   . Osteoporosis   . Tobacco use   . Bradycardia   . GERD (gastroesophageal reflux disease)   . Colon polyp   . Peripheral neuropathy   . Seasonal allergies   . Sepsis     PAST SURGICAL HISTORY: Past Surgical History  Procedure Laterality Date  . Abdominal hysterectomy    . Cholecystectomy  1998  . Appendectomy    . Tonsillectomy    . Mouth surgery    . Colonoscopy    . Inguinal hernia repair Right 06/27/2013    Procedure: RIGHT INGUINAL HERNIA REPAIR WITH MESH;  Surgeon: Velora Heckler, MD;  Location: [facility];  Service: General;   Laterality: Right;  . Insertion of mesh Right 06/27/2013    Procedure: INSERTION OF MESH;  Surgeon: Velora Heckler, MD;  Location: [facility];  Service: General;  Laterality: Right;  . Hernia repair  05/2013  . Bladder tacking      FAMILY HISTORY: Family History  Problem Relation Age of Onset  . Dementia Mother   . Diabetes Mellitus II Father   . Prostate cancer Father   . Breast cancer Sister   . Dementia Sister     SOCIAL HISTORY: History   Social History  . Marital Status: Widowed    Spouse Name: N/A  . Number of Children: 2  . Years of Education: 9TH   Occupational History  .     Social History Main Topics  . Smoking status: Former Smoker    Types: Cigarettes    Quit date: 04/23/2009  . Smokeless tobacco: Never Used  . Alcohol Use: No  . Drug Use: No  . Sexual Activity: No   Other Topics Concern  . Not on file   Social History Narrative   Patient is widowed with 2 children   Patient has a 9th grade education   Patient is right handed   Patient does not drink caffeine     PHYSICAL EXAM  Filed Vitals:   09/05/14 1408  BP: 132/68  Pulse: 72  Height: 5' 5.5" (1.664 m)  Weight: 143 lb (64.864 kg)   Body mass index is 23.43 kg/(m^2).   PHYSICAL EXAMNIATION:  Gen: NAD, conversant, well nourised, obese, well groomed                     Cardiovascular: Regular rate rhythm, no peripheral edema, warm, nontender. Eyes: Conjunctivae clear without exudates or hemorrhage Neck: Supple, no carotid bruise. Pulmonary: Clear to auscultation bilaterally   NEUROLOGICAL EXAM:  MENTAL STATUS: Speech:    Speech is normal; fluent and spontaneous with normal comprehension.  Cognition:    The patient is oriented to person, place, and time;     recent and remote memory intact;     language fluent;     normal attention, concentration,     fund of knowledge.  CRANIAL NERVES: CN II: Visual fields are full to confrontation. Fundoscopic exam is normal  with sharp discs and no vascular changes. Venous pulsations are present bilaterally. Pupils are 4 mm and briskly reactive to light. Visual acuity is 20/20 bilaterally. CN III, IV, VI: extraocular movement are normal. No ptosis. CN V: Facial sensation is intact to pinprick in all 3 divisions bilaterally. Corneal responses are intact.  CN VII: Face is symmetric with normal eye closure and smile. CN VIII: Hearing is normal to rubbing fingers  CN IX, X: Palate elevates symmetrically. Phonation is normal. CN XI: Head turning and shoulder shrug are intact CN XII: Tongue is midline with normal movements and no atrophy.  MOTOR: There is no pronator drift of out-stretched arms. Muscle bulk and tone are normal. Muscle strength is normal.  REFLEXES: Reflexes are 2+ and symmetric at the biceps, triceps, knees, and ankles. Plantar responses are flexor.  SENSORY: Mildly length dependent decreased light touch and pinprick, preserved position sense, and vibration sensations at fingers and toes.  COORDINATION: Rapid alternating movements and fine finger movements are intact. There is no dysmetria on finger-to-nose and heel-knee-shin. There are no abnormal or extraneous movements.   GAIT/STANCE: Posture is normal. Gait is steady with normal steps, base, arm swing, and turning. Heel and toe walking are normal. Tandem gait is normal.  Romberg is absent.  DIAGNOSTIC DATA (LABS, IMAGING, TESTING) - I reviewed patient records, labs, notes, testing and imaging myself where available.  Lab Results  Component Value Date   WBC 14.0* 06/13/2014   HGB 10.9* 06/13/2014   HCT 32.7* 06/13/2014   MCV 88.4 06/13/2014   PLT 216 06/13/2014      Component Value Date/Time   NA 132* 06/13/2014 0848   K 3.8 06/13/2014 0848   CL 99 06/13/2014 0848   CO2 19 06/13/2014 0848   GLUCOSE 253* 06/13/2014 0848   BUN 34* 06/13/2014 0848   CREATININE 1.27* 06/13/2014 0848   CALCIUM 8.8 06/13/2014 0848   PROT 7.7  11/26/2013 1450   ALBUMIN 3.7 11/26/2013 1450   AST 43* 11/26/2013 1450   ALT 35 11/26/2013 1450   ALKPHOS 124* 11/26/2013 1450   BILITOT 1.1 11/26/2013 1450   GFRNONAA 40* 06/13/2014 0848   GFRAA 47* 06/13/2014 0848    Lab Results  Component Value Date   HGBA1C 7.2* 11/28/2013    Lab Results  Component Value Date   TSH 4.103 12/18/2012   ASSESSMENT AND PLAN  74 y.o. year old female    1. 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 Ph.D.  [facility] [address] [place], [place] [address] Phone: [phone] Fax:      [phone]

## 2014-09-07 ENCOUNTER — Ambulatory Visit: Payer: Medicare Other | Admitting: Neurology

## 2015-04-12 ENCOUNTER — Other Ambulatory Visit: Payer: Self-pay

## 2015-04-12 DIAGNOSIS — Z1231 Encounter for screening mammogram for malignant neoplasm of breast: Secondary | ICD-10-CM

## 2015-05-22 ENCOUNTER — Ambulatory Visit
Admission: RE | Admit: 2015-05-22 | Discharge: 2015-05-22 | Disposition: A | Discharge status: Home / Self Care | Payer: Medicare Other | Source: Ambulatory Visit

## 2015-05-22 DIAGNOSIS — Z1231 Encounter for screening mammogram for malignant neoplasm of breast: Secondary | ICD-10-CM

## 2015-09-04 ENCOUNTER — Encounter: Payer: Self-pay | Admitting: Nurse Practitioner

## 2015-09-04 ENCOUNTER — Ambulatory Visit (INDEPENDENT_AMBULATORY_CARE_PROVIDER_SITE_OTHER): Payer: Medicare Other | Admitting: Nurse Practitioner

## 2015-09-04 VITALS — BP 142/70 | HR 60 | Ht 65.5 in | Wt 149.6 lb

## 2015-09-04 DIAGNOSIS — G250 Essential tremor: Secondary | ICD-10-CM | POA: Diagnosis not present

## 2015-09-04 DIAGNOSIS — G609 Hereditary and idiopathic neuropathy, unspecified: Secondary | ICD-10-CM | POA: Insufficient documentation

## 2015-09-04 MED ORDER — PRIMIDONE 50 MG PO TABS: 50.0000 mg | ORAL_TABLET | Freq: Three times a day (TID) | ORAL | Status: DC

## 2015-09-04 MED ORDER — PREGABALIN 50 MG PO CAPS: 50.0000 mg | ORAL_CAPSULE | Freq: Every day | ORAL | Status: DC

## 2015-09-04 NOTE — Patient Instructions (Signed)
Continue primidone 3 times daily refill Continue Lyrica 50 mg at night, take about 2 hours before bedtime Use weighted utensils Follow-up yearly and when necessary

## 2015-09-04 NOTE — Progress Notes (Signed)
I have reviewed and agreed above plan. 

## 2015-09-04 NOTE — Progress Notes (Signed)
Received fax confirmation for lyrica . [phone].

## 2015-09-04 NOTE — Progress Notes (Signed)
[facility]  PATIENT: Rebecca Flores DOB: November 15, 1940   REASON FOR VISIT: Follow-up for essential tremor, peripheral neuropathy HISTORY FROM: Patient    HISTORY OF PRESENT ILLNESS: HISTORY: Rebecca Flores, 75 year old white female returns for followup for essential tremor. She was last seen in our office 09/05/2012. She has been followed here since June 2009 for tremor felt to represent essential tremor, and previously followed by Dr. Thad Ranger. Tremor also involves the head and to a lesser extent the hands. She has been treated with primidone with improvement. She is independent in all activities of daily living and she drives a car without problems.Her father suffered Parkinson's disease, mother died of Alzheimer's disease, there was no tremors in her siblings, she is now taking primidone 50 mg three times daily at 830, 1230 and 7:30 pm.She denies significant side effects from the medications.   UPDATE September 05 2014:Rebecca Flores was diagnosed with diabetes around 2014, she now complains of bilateral toes, feet paresthesia, burning discomfort, difficulty falling to sleep because of her feet pain,  She has tried gabapentin without helping her symptoms, she denies gait difficulty UPDATE 07/12/2017CM Rebecca Flores, 75 year old female returns for yearly follow-up. She has a history of essential tremor and bilateral toes paresthesias. Her essential tremor is about the same, her head tremor maybe a little worse. She remains on primidone without side effects. She only takes her Lyrica several times a week. She continues to walk every day for exercise. She returns for reevaluation.  REVIEW OF SYSTEMS: Full 14 system review of systems performed and notable only for those listed, all others are neg:  Constitutional: neg  Cardiovascular: neg Ear/Nose/Throat: neg  Skin: neg Eyes: neg Respiratory: neg Gastroitestinal: neg  Hematology/Lymphatic: neg  Endocrine: neg Musculoskeletal: Muscle  cramps Allergy/Immunology: neg Neurological: Tremor Psychiatric: neg Sleep : Insomnia   ALLERGIES: Allergies  Allergen Reactions  . Nsaids     Severe facial swelling/rash   . Atenolol     Hair loss   . Codeine Nausea Only    Nausea   . Primidone     Emotional lability   . Simvastatin     Leg cramps  . Sulfa Antibiotics     Pruritis      HOME MEDICATIONS: Outpatient Prescriptions Prior to Visit  Medication Sig Dispense Refill  . aspirin EC 81 MG tablet Take 81 mg by mouth 3 (three) times a week. Monday, Wednesday, Friday.    Marland Kitchen BIOTIN FORTE PO Take by mouth daily.    . Capsaicin 0.1 % CREA Apply to feet as needed before bedtime 1 Tube 3  . lisinopril (PRINIVIL,ZESTRIL) 5 MG tablet Take 5 mg by mouth every morning.     . loratadine (CLARITIN) 10 MG tablet Take 10 mg by mouth daily as needed for allergies (congestion).     . metFORMIN (GLUCOPHAGE) 500 MG tablet TAKE 1 TABLET WITH MEALS TWICE A DAY  3  . Multiple Vitamin (MULTIVITAMIN WITH MINERALS) TABS tablet Take 1 tablet by mouth daily.    . Omega-3 Fatty Acids (FISH OIL BURP-LESS PO) Take by mouth daily.    . pregabalin (LYRICA) 50 MG capsule Take 1 capsule (50 mg total) by mouth at bedtime. 30 capsule 2  . primidone (MYSOLINE) 50 MG tablet Take 1 tablet (50 mg total) by mouth 3 (three) times daily. Indication: essential tremors 270 tablet 3  . omeprazole (PRILOSEC) 20 MG capsule Take 20 mg by mouth every other day. Reported on 09/04/2015     No facility-administered medications  prior to visit.    PAST MEDICAL HISTORY: Past Medical History  Diagnosis Date  . Diabetes mellitus   . Tremors of nervous system   . Kidney stone   . Hypertension   . Movement disorder   . Family history of anesthesia complication     sister hard to wake up  . Hypercholesteremia   . Osteoporosis   . Tobacco use   . Bradycardia   . GERD (gastroesophageal reflux disease)   . Colon polyp   . Peripheral neuropathy (HCC)   . Seasonal  allergies   . Sepsis (HCC)     PAST SURGICAL HISTORY: Past Surgical History  Procedure Laterality Date  . Abdominal hysterectomy    . Cholecystectomy  1998  . Appendectomy    . Tonsillectomy    . Mouth surgery    . Colonoscopy    . Inguinal hernia repair Right 06/27/2013    Procedure: RIGHT INGUINAL HERNIA REPAIR WITH MESH;  Surgeon: Velora Heckler, MD;  Location: [facility];  Service: General;  Laterality: Right;  . Insertion of mesh Right 06/27/2013    Procedure: INSERTION OF MESH;  Surgeon: Velora Heckler, MD;  Location: [facility];  Service: General;  Laterality: Right;  . Hernia repair  05/2013  . Bladder tacking      FAMILY HISTORY: Family History  Problem Relation Age of Onset  . Dementia Mother   . Diabetes Mellitus II Father   . Prostate cancer Father   . Breast cancer Sister   . Dementia Sister     SOCIAL HISTORY: Social History   Social History  . Marital Status: Widowed    Spouse Name: N/A  . Number of Children: 2  . Years of Education: 9TH   Occupational History  .     Social History Main Topics  . Smoking status: Former Smoker    Types: Cigarettes    Quit date: 04/23/2009  . Smokeless tobacco: Never Used  . Alcohol Use: No  . Drug Use: No  . Sexual Activity: No   Other Topics Concern  . Not on file   Social History Narrative   Patient is widowed with 2 children   Patient has a 9th grade education   Patient is right handed   Patient does not drink caffeine     PHYSICAL EXAM  Filed Vitals:   09/04/15 0934  BP: 142/70  Pulse: 60  Height: 5' 5.5" (1.664 m)  Weight: 149 lb 9.6 oz (67.858 kg)   Body mass index is 24.51 kg/(m^2). General: well developed, well nourished, seated, in no evident distress  Head: head normocephalic and atraumatic. Oropharynx benign  Neurologic Exam  Mental Status: Awake and fully alert. Oriented to place and time. Follows all commands. Speech and language normal.  Cranial Nerves:  Pupils equal, briskly reactive to light. Extraocular movements full without nystagmus. Visual fields full to confrontation. Hearing intact and symmetric to finger snap. Facial sensation intact. Face, tongue, palate move normally and symmetrically. Neck flexion and extension normal.  Motor: Normal bulk and tone. Normal strength in all tested extremity muscles.No focal weakness. Bilateral mild essential tremor, mild head titubation  Coordination: Rapid alternating movements normal in all extremities. Finger-to-nose and heel-to-shin performed accurately bilaterally. No dysmetria  Sensory mild length dependent decreased light touch and pinprick at toes, preserved position sense and vibratory Gait and Station: Arises from chair without difficulty. Stance is normal. Gait demonstrates normal stride length and balance . Able to heel,  toe and  Unsteady with tandem walk . No assistive device Romberg negative Reflexes: 2+ and symmetric. Toes downgoing.   DIAGNOSTIC DATA (LABS, IMAGING, TESTING) - I reviewed patient records, labs, notes, testing and imaging myself where available.  Lab Results  Component Value Date   WBC 14.0* 06/13/2014   HGB 10.9* 06/13/2014   HCT 32.7* 06/13/2014   MCV 88.4 06/13/2014   PLT 216 06/13/2014      Component Value Date/Time   NA 132* 06/13/2014 0848   K 3.8 06/13/2014 0848   CL 99 06/13/2014 0848   CO2 19 06/13/2014 0848   GLUCOSE 253* 06/13/2014 0848   BUN 34* 06/13/2014 0848   CREATININE 1.27* 06/13/2014 0848   CALCIUM 8.8 06/13/2014 0848   PROT 7.7 11/26/2013 1450   ALBUMIN 3.7 11/26/2013 1450   AST 43* 11/26/2013 1450   ALT 35 11/26/2013 1450   ALKPHOS 124* 11/26/2013 1450   BILITOT 1.1 11/26/2013 1450   GFRNONAA 40* 06/13/2014 0848   GFRAA 47* 06/13/2014 0848      ASSESSMENT AND PLAN [age] y.o. year old female   1. Essential tremor, refill her primidone 50 mg 3 times a day  2, peripheral neuropathy, most likely diabetic small fiber  neuropathy,continue Lyrica 50 mg every night  Follow-up in one year or call sooner if problems arise Nilda RiggsNancy Carolyn Martin, [facility] LLCGNP, [facility] HospitalBC, APRN  [facility] [address], [address] [place], [place] [address] [phone]

## 2015-09-09 ENCOUNTER — Telehealth: Payer: Self-pay | Admitting: Nurse Practitioner

## 2015-09-09 NOTE — Telephone Encounter (Signed)
I spoke with pt and she is going to take only amitriptyline at this time, since she is doing much better, and if leg pain increases she will start with the lyrica.

## 2015-09-09 NOTE — Telephone Encounter (Signed)
I called pt.  She is taking amitriptyline (green pill) which she taking every night since she was seen here.  We do not have this down on our list.   She was here and told to take lyrica 50mg  po qhs as well as her primidone tid.  I called pharmacy and their computers are not working well.  I was told to call back.

## 2015-09-09 NOTE — Telephone Encounter (Signed)
Patient is calling and stated she did not pick up the Rx Lyrica at the pharmacy as she didn't know anything about it.  She said the pharmacist told her it would make her droszy.  Please call.

## 2015-09-09 NOTE — Telephone Encounter (Signed)
Pt was already on Lyrica.This is is for neuropathy. Ok to take with amitriptyline.

## 2015-09-09 NOTE — Telephone Encounter (Signed)
I called and spoke to pharmacist.  She is also on amitriptylline 25,g po qhs per Dr. Kevan NyGates.  She states that she has been taking this every night since she left our office, thinking this was what she was to do and notes that she is better.  She is concerned that taking both the lyrica and amitriptyline will make her too drowsy.  I placed this on her med list.

## 2015-09-18 ENCOUNTER — Telehealth: Payer: Self-pay | Admitting: Nurse Practitioner

## 2015-09-18 NOTE — Telephone Encounter (Signed)
Please call [phone], as she is having work done at her home.

## 2015-09-18 NOTE — Telephone Encounter (Signed)
You are right she can stop the Lyrica and get back on amitriptyline

## 2015-09-18 NOTE — Telephone Encounter (Signed)
Patient is calling and states that the Rx pregabalin that was prescribed to her is giving her swelling in her stomach, feet, etc and she has constipation and is concerned about her kidneys.  Please call.

## 2015-09-18 NOTE — Telephone Encounter (Signed)
I spoke to pt then her daughter. Pt has been off the amitriptyline for 2 wks. (that was prescribed for her by Dr. Kevan Ny for neuro pain.  She started the lyrica 50mg  po qhs and has taken for 2 wks (today).  It is causing her constipation and she feels tightness in her ankle/feet.  She wants to go off the lyrica and start back on amitriptyline.  I told her that since she has been on this for only 2wks, she can stop this and then go back on the amitriptyline.  I would relay to CM/NP and would call her back on mobile.  Daughter verbalized understanding.

## 2015-09-19 NOTE — Telephone Encounter (Signed)
Attempted to call mobile.   No answer and VM not set up.  Will try later.

## 2015-09-19 NOTE — Telephone Encounter (Signed)
I called and spoke to pt.  I relayed that she can stop the lyrica and restart the amitriptyline, she verbalized understanding.

## 2015-09-20 NOTE — Addendum Note (Signed)
Addended byHermenia Fiscal on: 09/20/2015 10:20 AM   Modules accepted: Orders

## 2016-04-09 ENCOUNTER — Other Ambulatory Visit: Payer: Self-pay | Admitting: Obstetrics and Gynecology

## 2016-04-09 DIAGNOSIS — Z1231 Encounter for screening mammogram for malignant neoplasm of breast: Secondary | ICD-10-CM

## 2016-05-06 IMAGING — CR DG CHEST 2V
2 series · 2 of 2 positions shown · non-contrast
Comparison: 11/26/2013

CLINICAL DATA: Cough.  Vomiting.

EXAM:
CHEST  2 VIEW

[w chest pa]
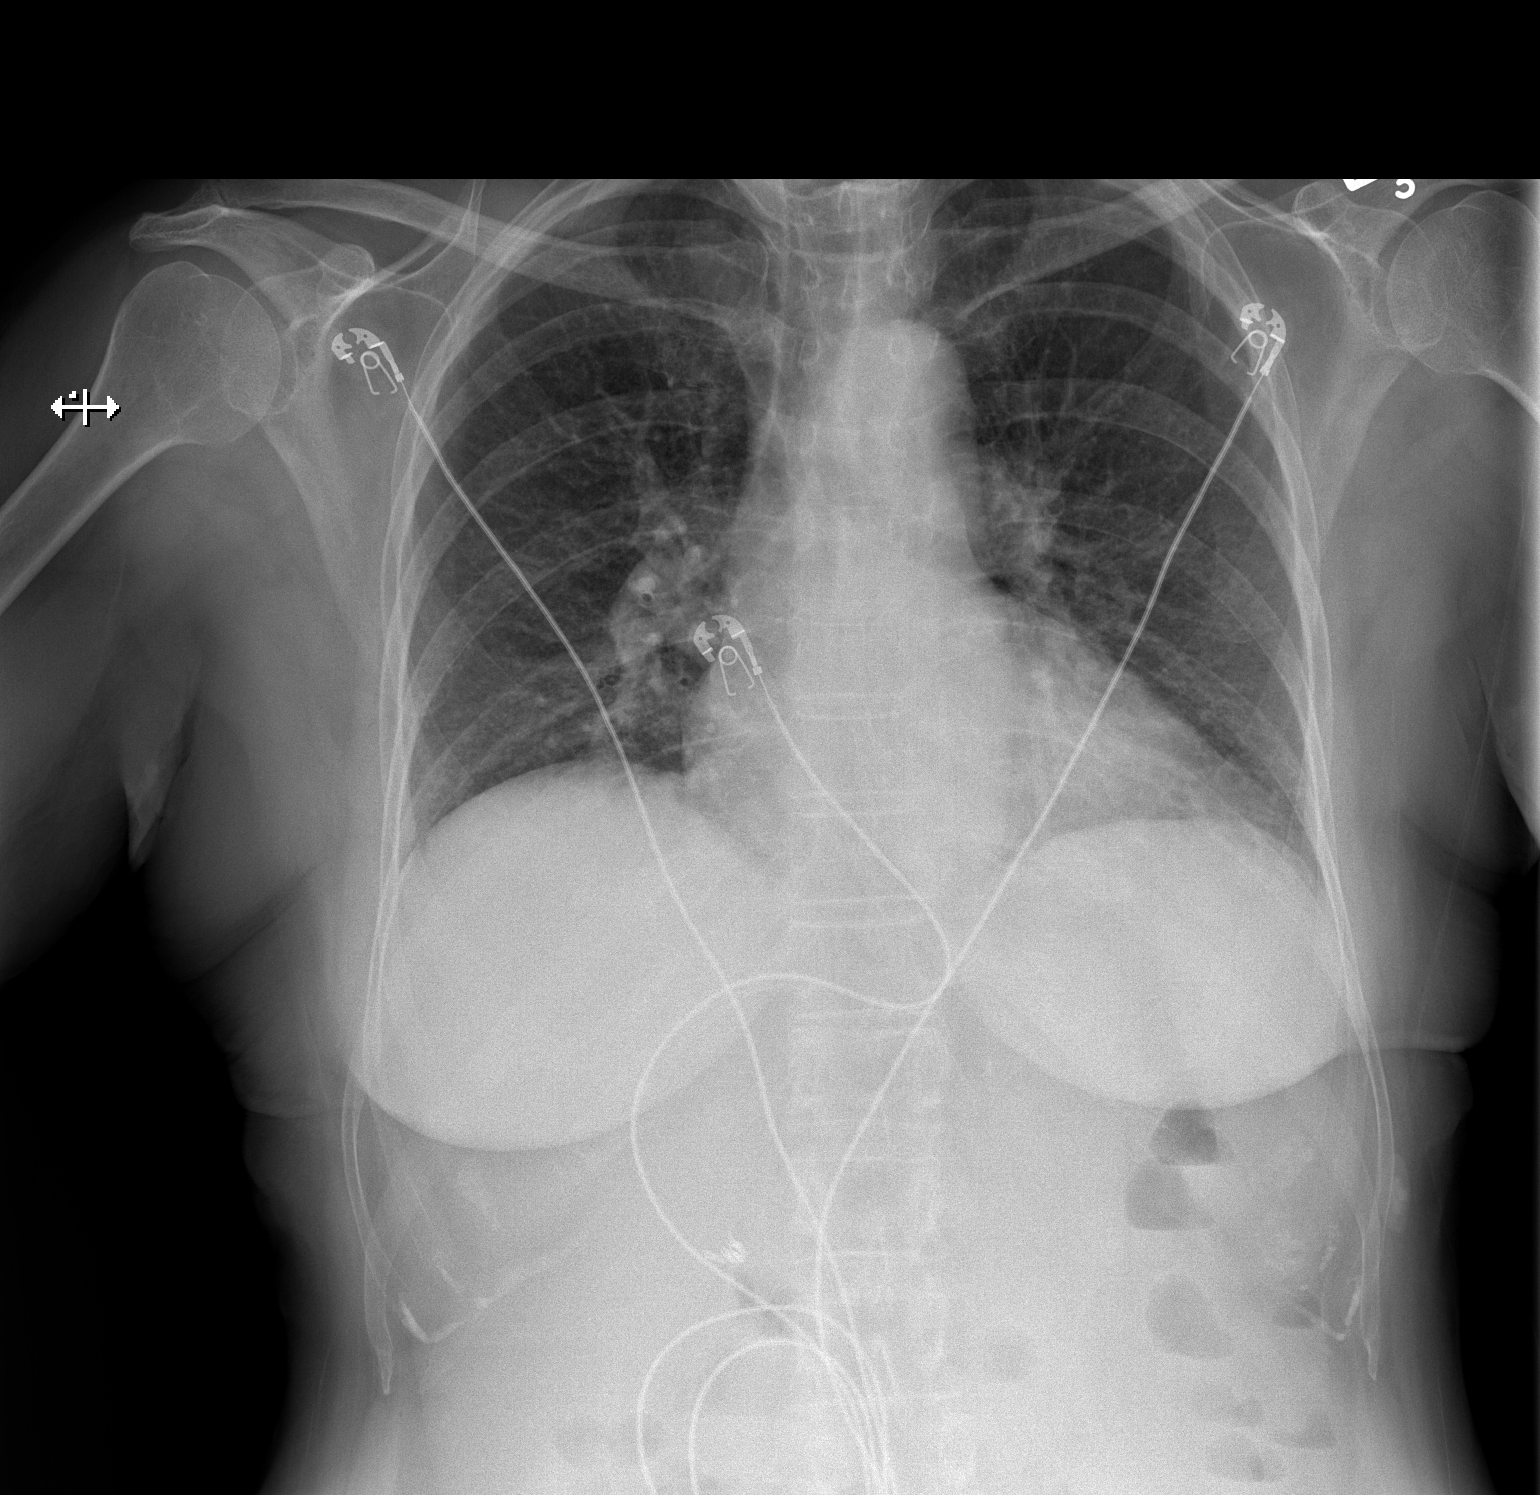

[w chest lat]
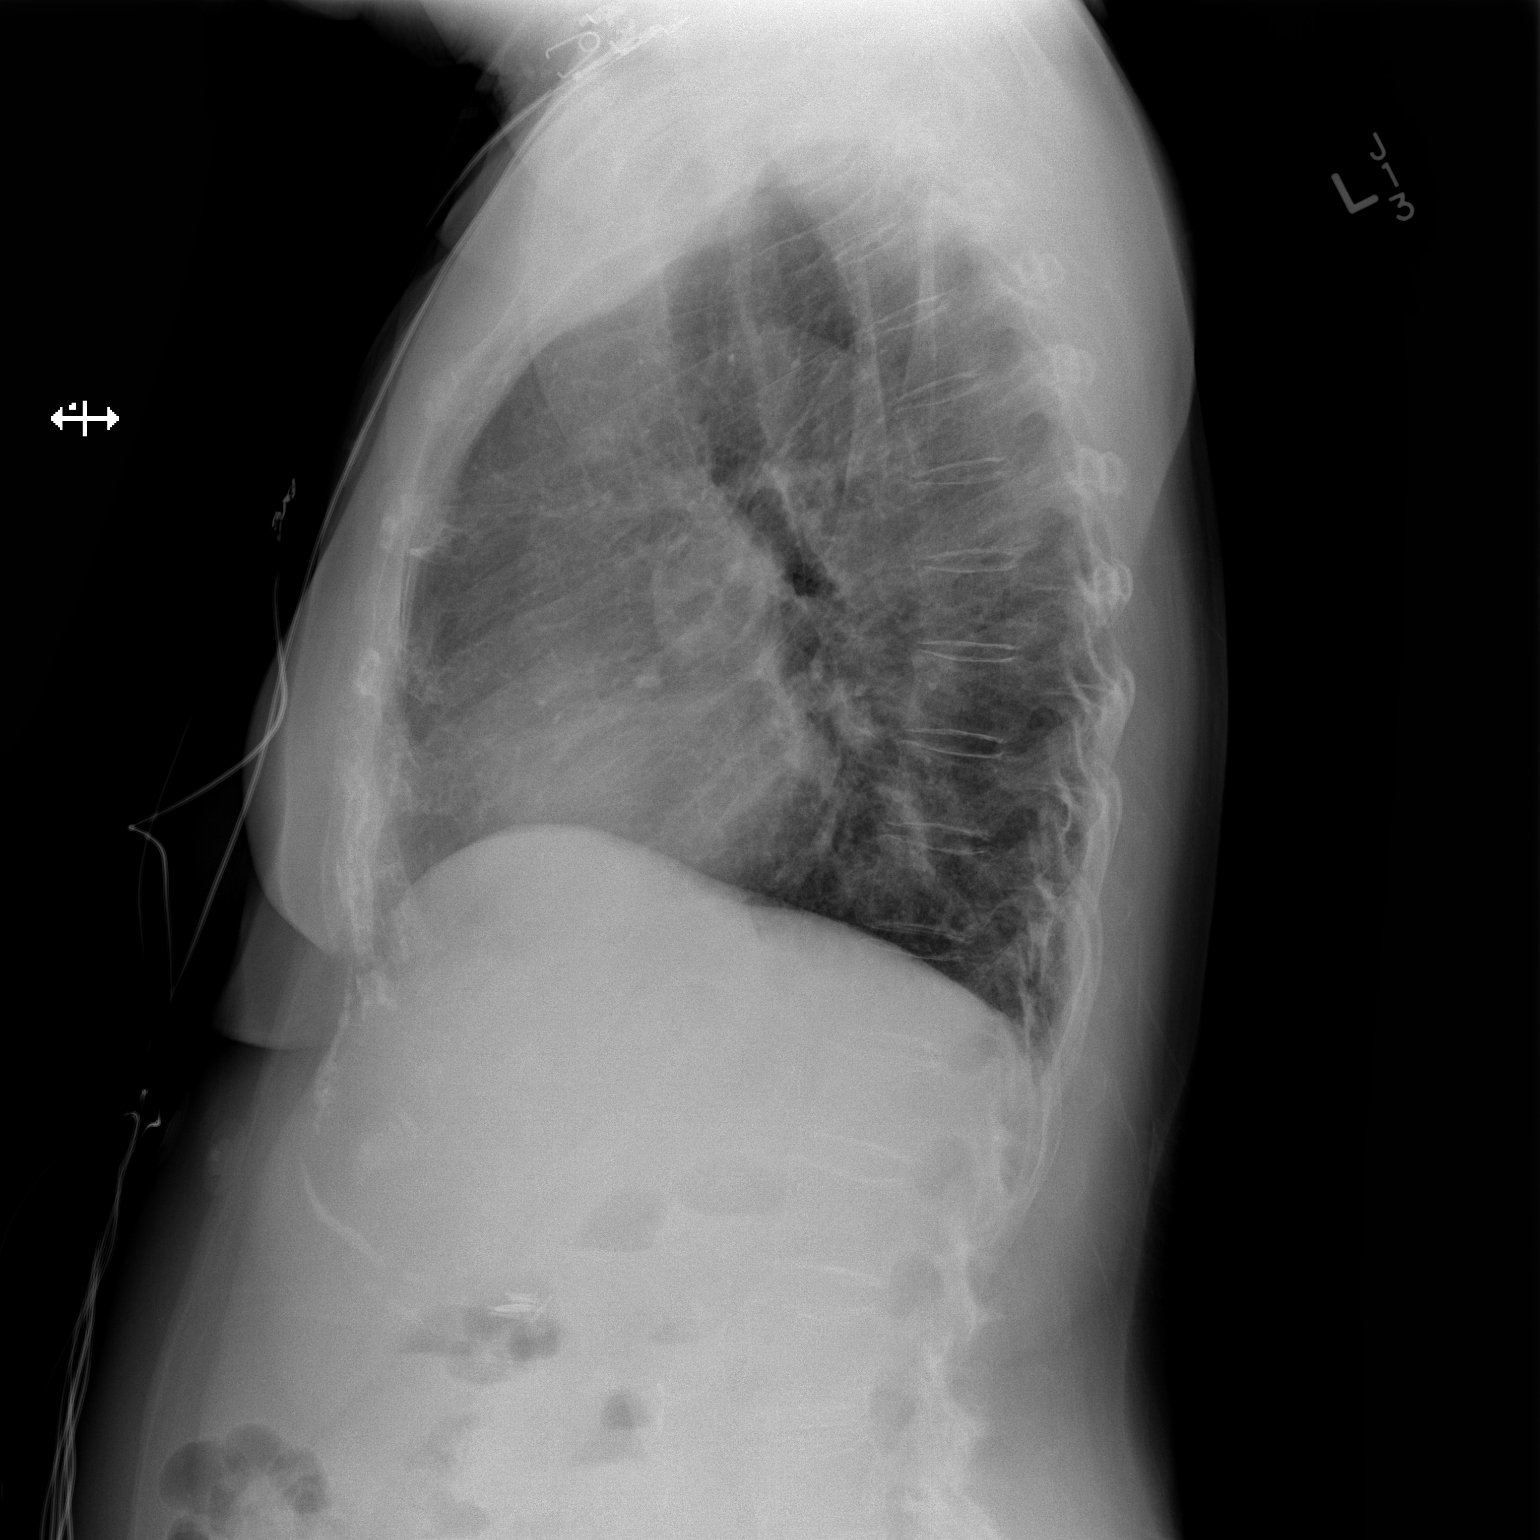

[2 of 2 positions shown; findings below may reference images not displayed]

FINDINGS: There is peribronchial thickening. No infiltrates or effusions.
Heart size and vascularity are normal. No osseous abnormality.
IMPRESSION: Bronchitic changes.

## 2016-05-22 ENCOUNTER — Ambulatory Visit
Admission: RE | Admit: 2016-05-22 | Discharge: 2016-05-22 | Disposition: A | Discharge status: Home / Self Care | Payer: Medicare Other | Source: Ambulatory Visit | Attending: Obstetrics and Gynecology | Admitting: Obstetrics and Gynecology

## 2016-05-22 DIAGNOSIS — Z1231 Encounter for screening mammogram for malignant neoplasm of breast: Secondary | ICD-10-CM

## 2016-09-07 ENCOUNTER — Encounter: Payer: Self-pay | Admitting: Nurse Practitioner

## 2016-09-07 ENCOUNTER — Ambulatory Visit (INDEPENDENT_AMBULATORY_CARE_PROVIDER_SITE_OTHER): Payer: Medicare Other | Admitting: Nurse Practitioner

## 2016-09-07 VITALS — BP 138/74 | HR 55 | Wt 150.2 lb

## 2016-09-07 DIAGNOSIS — G609 Hereditary and idiopathic neuropathy, unspecified: Secondary | ICD-10-CM | POA: Diagnosis not present

## 2016-09-07 DIAGNOSIS — R269 Unspecified abnormalities of gait and mobility: Secondary | ICD-10-CM | POA: Diagnosis not present

## 2016-09-07 DIAGNOSIS — G250 Essential tremor: Secondary | ICD-10-CM

## 2016-09-07 MED ORDER — PRIMIDONE 50 MG PO TABS: 50.0000 mg | ORAL_TABLET | Freq: Three times a day (TID) | ORAL | 3 refills | Status: DC

## 2016-09-07 NOTE — Patient Instructions (Addendum)
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 Terrace Arabia  Diabetic Neuropathy Diabetic neuropathy is a nerve disease or nerve damage that is caused by diabetes mellitus. About half of all people with diabetes mellitus have some form of nerve damage. Nerve damage is more common in those who have had diabetes mellitus for many years and who generally have not had good control of their blood sugar (glucose) level. Diabetic neuropathy is a common complication of diabetes mellitus. There are three common types of diabetic neuropathy and a fourth type that is less common and less understood:  Peripheral neuropathy-This is the most common type of diabetic neuropathy. It causes damage to the nerves of the feet and legs first and then eventually the hands and arms. The damage affects the ability to sense touch.  Autonomic neuropathy-This type causes damage to the autonomic nervous system, which controls the following functions: ? Heartbeat. ? Body temperature. ? Blood pressure. ? Urination. ? Digestion. ? Sweating. ? Sexual function.  Focal neuropathy-Focal neuropathy can be painful and unpredictable and occurs most often in older adults with diabetes mellitus. It involves a specific nerve or one area and often comes on suddenly. It usually does not cause long-term problems.  Radiculoplexus neuropathy- Sometimes called lumbosacral radiculoplexus neuropathy, radiculoplexus neuropathy affects the nerves of the thighs, hips, buttocks, or legs. It is more common in people with type 2 diabetes mellitus and in older men. It is characterized by debilitating pain, weakness, and atrophy, usually in the thigh muscles.  What are the causes? The cause of peripheral, autonomic, and focal neuropathies is diabetes mellitus that is  uncontrolled and high glucose levels. The cause of radiculoplexus neuropathy is unknown. However, it is thought to be caused by inflammation related to uncontrolled glucose levels. What are the signs or symptoms? Peripheral Neuropathy Peripheral neuropathy develops slowly over time. When the nerves of the feet and legs no longer work there may be:  Burning, stabbing, or aching pain in the legs or feet.  Inability to feel pressure or pain in your feet. This can lead to: ? Thick calluses over pressure areas. ? Pressure sores. ? Ulcers.  Foot deformities.  Reduced ability to feel temperature changes.  Muscle weakness.  Autonomic Neuropathy The symptoms of autonomic neuropathy vary depending on which nerves are affected. Symptoms may include:  Problems with digestion, such as: ? Feeling sick to your stomach (nausea). ? Vomiting. ? Bloating. ? Constipation. ? Diarrhea. ? Abdominal pain.  Difficulty with urination. This occurs if you lose your ability to sense when your bladder is full. Problems include: ? Urine leakage (incontinence). ? Inability to empty your bladder completely (retention).  Rapid or irregular heartbeat (palpitations).  Blood pressure drops when you stand up (orthostatic hypotension). When you stand up you may feel: ? Dizzy. ? Weak. ? Faint.  In men, inability to attain and maintain an erection.  In women, vaginal dryness and problems with decreased sexual desire and arousal.  Problems with body temperature regulation.  Increased or decreased sweating.  Focal Neuropathy  Abnormal eye movements or abnormal alignment of both eyes.  Weakness in the wrist.  Foot drop. This results in an inability to lift the foot properly and abnormal walking or foot movement.  Paralysis on one side of your face (Bell palsy).  Chest or abdominal pain. Radiculoplexus Neuropathy  Sudden, severe pain in your hip, thigh, or buttocks.  Weakness and wasting of thigh  muscles.  Difficulty rising from a seated position.  Abdominal swelling.  Unexplained weight loss (usually more than 10 lb [4.5 kg]). How is this diagnosed? Peripheral Neuropathy Your senses may be tested. Sensory function testing can be done with:  A light touch using a monofilament.  A vibration with tuning fork.  A sharp sensation with a pin prick.  Other tests that can help diagnose neuropathy are:  Nerve conduction velocity. This test checks the transmission of an electrical current through a nerve.  Electromyography. This shows how muscles respond to electrical signals transmitted by nearby nerves.  Quantitative sensory testing. This is used to assess how your nerves respond to vibrations and changes in temperature.  Autonomic Neuropathy Diagnosis is often based on reported symptoms. Tell your health care provider if you experience:  Dizziness.  Constipation.  Diarrhea.  Inappropriate urination or inability to urinate.  Inability to get or maintain an erection.  Tests that may be done include:  Electrocardiography or Holter monitor. These are tests that can help show problems with the heart rate or heart rhythm.  An X-ray exam may be done.  Focal Neuropathy Diagnosis is made based on your symptoms and what your health care provider finds during your exam. Other tests may be done. They may include:  Nerve conduction velocities. This checks the transmission of electrical current through a nerve.  Electromyography. This shows how muscles respond to electrical signals transmitted by nearby nerves.  Quantitative sensory testing. This test is used to assess how your nerves respond to vibration and changes in temperature.  Radiculoplexus Neuropathy  Often the first thing is to eliminate any other issue or problems that might be the cause, as there is no standard test for diagnosis.  X-ray exam of your spine and lumbar region.  Spinal tap to rule out  cancer.  MRI to rule out other lesions. How is this treated? Once nerve damage occurs, it cannot be reversed. The goal of treatment is to keep the disease or nerve damage from getting worse and affecting more nerve fibers. Controlling your blood glucose level is the key. Most people with radiculoplexus neuropathy see at least a partial improvement over time. You will need to keep your blood glucose and HbA1c levels in the target range determined by your health care provider. Things that help control blood glucose levels include:  Blood glucose monitoring.  Meal planning.  Physical activity.  Diabetes medicine.  Over time, maintaining lower blood glucose levels helps lessen symptoms. Sometimes, prescription pain medicine is needed. Follow these instructions at home:  Do not smoke.  Keep your blood glucose level in the range that you and your health care provider have determined acceptable for you.  Keep your blood pressure level in the range that you and your health care provider have determined acceptable for you.  Eat a well-balanced diet.  Be physically active every day. Include strength training and balance exercises.  Protect your feet. ? Check your feet every day for sores, cuts, blisters, or signs of infection. ? Wear padded socks and supportive shoes. Use orthotic inserts, if necessary. ? Regularly check the insides of your shoes for worn spots. Make sure there are no rocks or other items inside your shoes before you put them on. Contact a health care provider if:  You have burning, stabbing, or aching pain in the legs or feet.  You are unable to feel  pressure or pain in your feet.  You develop problems with digestion such as: ? Nausea. ? Vomiting. ? Bloating. ? Constipation. ? Diarrhea. ? Abdominal pain.  You have difficulty with urination, such as: ? Incontinence. ? Retention.  You have palpitations.  You develop orthostatic hypotension. When you stand up  you may feel: ? Dizzy. ? Weak. ? Faint.  You cannot attain and maintain an erection (in men).  You have vaginal dryness and problems with decreased sexual desire and arousal (in women).  You have severe pain in your thighs, legs, or buttocks.  You have unexplained weight loss. This information is not intended to replace advice given to you by your health care provider. Make sure you discuss any questions you have with your health care provider. Document Released: 04/20/2001 Document Revised: 07/18/2015 Document Reviewed: 07/21/2012 Elsevier Interactive Patient Education  2017 [employer]

## 2016-09-07 NOTE — Progress Notes (Signed)
[facility]  PATIENT: Rebecca Flores DOB: Feb 01, 1941   REASON FOR VISIT: Follow-up for essential tremor, peripheral neuropathy, gait disorder HISTORY FROM: Patient and daughter Steward Drone    HISTORY OF PRESENT ILLNESS: HISTORY: Rebecca Flores, 76 year old white female returns for followup for essential tremor. She was last seen in our office 09/05/2012. She has been followed here since June 2009 for tremor felt to represent essential tremor, and previously followed by Dr. Thad Ranger. Tremor also involves the head and to a lesser extent the hands. She has been treated with primidone with improvement. She is independent in all activities of daily living and she drives a car without problems.Her father suffered Parkinson's disease, mother died of Alzheimer's disease, there was no tremors in her siblings, she is now taking primidone 50 mg three times daily at 830, 1230 and 7:30 pm.She denies significant side effects from the medications.   UPDATE September 05 2014:Rebecca Flores was diagnosed with diabetes around 2014, she now complains of bilateral toes, feet paresthesia, burning discomfort, difficulty falling to sleep because of her feet pain,  She has tried gabapentin without helping her symptoms, she denies gait difficulty UPDATE 07/12/2017CM Rebecca. Flores, 76 year old female returns for yearly follow-up. She has a history of essential tremor and bilateral toes paresthesias. Her essential tremor is about the same, her head tremor maybe a little worse. She remains on primidone without side effects. She only takes her Lyrica several times a week. She continues to walk every day for exercise. She returns for reevaluation. UPDATE 07/16/2018CM Rebecca Flores, 76 year old female returns for follow-up with a history of essential tremor and bilateral feet paresthesias left worse than right. Her essential tremor is about the same, her head tremor is about the same. She remains on primidone without side effects.  When last seen she was on Lyrica for her neuropathy symptoms however she had side effects on the medication for constipation so she stopped it. She has been on amitriptyline prescribed by Dr. Kevan Ny and also low dose gabapentin. She claims her blood sugars run around 200 most days. She had one fall since last seen and hurt her knee. She claims she has some fluid aspirated from the knee. She ambulates with a single-point cane. She returns for reevaluation REVIEW OF SYSTEMS: Full 14 system review of systems performed and notable only for those listed, all others are neg:  Constitutional: Fatigue  Cardiovascular: neg Ear/Nose/Throat: neg  Skin: neg Eyes: neg Respiratory: neg Gastroitestinal: neg  Hematology/Lymphatic: Easy bruising  Endocrine: neg Musculoskeletal: Muscle cramps Allergy/Immunology: neg Neurological: Tremor Psychiatric: Anxiety Sleep : Insomnia   ALLERGIES: Allergies  Allergen Reactions  . Nsaids     Severe facial swelling/rash   . Other Rash    Seldane  . Atenolol     Hair loss   . Codeine Nausea Only    Nausea   . Lyrica [Pregabalin]     Constipation, swelling  . Primidone     Emotional lability   . Simvastatin     Leg cramps  . Sulfa Antibiotics     Pruritis      HOME MEDICATIONS: Outpatient Medications Prior to Visit  Medication Sig Dispense Refill  . amitriptyline (ELAVIL) 25 MG tablet Take 25 mg by mouth at bedtime.    Marland Kitchen aspirin EC 81 MG tablet Take 81 mg by mouth 3 (three) times a week. Monday, Wednesday, Friday.    Marland Kitchen lisinopril (PRINIVIL,ZESTRIL) 5 MG tablet Take 5 mg by mouth every morning.     . loratadine (CLARITIN)  10 MG tablet Take 10 mg by mouth daily as needed for allergies (congestion).     . metFORMIN (GLUCOPHAGE) 500 MG tablet TAKE 1 TABLET WITH MEALS TWICE A DAY  3  . Multiple Vitamin (MULTIVITAMIN WITH MINERALS) TABS tablet Take 1 tablet by mouth daily.    . Omega-3 Fatty Acids (FISH OIL BURP-LESS PO) Take by mouth daily.    .  primidone (MYSOLINE) 50 MG tablet Take 1 tablet (50 mg total) by mouth 3 (three) times daily. Indication: essential tremors 270 tablet 3  . BIOTIN FORTE PO Take by mouth daily.    . Capsaicin 0.1 % CREA Apply to feet as needed before bedtime (Patient not taking: Reported on 09/07/2016) 1 Tube 3   No facility-administered medications prior to visit.     PAST MEDICAL HISTORY: Past Medical History:  Diagnosis Date  . Bradycardia   . Colon polyp   . Diabetes mellitus   . Family history of anesthesia complication    sister hard to wake up  . GERD (gastroesophageal reflux disease)   . Hypercholesteremia   . Hypertension   . Kidney stone   . Movement disorder   . Osteoporosis   . Peripheral neuropathy   . Seasonal allergies   . Sepsis (HCC)   . Tobacco use   . Tremors of nervous system     PAST SURGICAL HISTORY: Past Surgical History:  Procedure Laterality Date  . ABDOMINAL HYSTERECTOMY    . APPENDECTOMY    . bladder tacking    . CHOLECYSTECTOMY  1998  . COLONOSCOPY    . HERNIA REPAIR  05/2013  . INGUINAL HERNIA REPAIR Right 06/27/2013   Procedure: RIGHT INGUINAL HERNIA REPAIR WITH MESH;  Surgeon: Velora Heckler, MD;  Location: [facility];  Service: General;  Laterality: Right;  . INSERTION OF MESH Right 06/27/2013   Procedure: INSERTION OF MESH;  Surgeon: Velora Heckler, MD;  Location: [facility];  Service: General;  Laterality: Right;  . MOUTH SURGERY    . TONSILLECTOMY      FAMILY HISTORY: Family History  Problem Relation Age of Onset  . Dementia Mother   . Diabetes Mellitus II Father   . Prostate cancer Father   . Breast cancer Sister   . Dementia Sister     SOCIAL HISTORY: Social History   Social History  . Marital status: Widowed    Spouse name: N/A  . Number of children: 2  . Years of education: 9TH   Occupational History  .  Retired   Social History Main Topics  . Smoking status: Former Smoker    Types: Cigarettes    Quit  date: 04/23/2009  . Smokeless tobacco: Never Used  . Alcohol use No  . Drug use: No  . Sexual activity: No   Other Topics Concern  . Not on file   Social History Narrative   Patient is widowed with 2 children   09/07/16 Lives alone   Patient has a 9th grade education   Patient is right handed   Patient does not drink caffeine     PHYSICAL EXAM  Vitals:   09/07/16 1005  BP: 138/74  Pulse: (!) 55  Weight: 150 lb 3.2 oz (68.1 kg)   Body mass index is 24.61 kg/m. General: well developed, well nourished, seated, in no evident distress  Head: head normocephalic and atraumatic. Oropharynx benign  Neurologic Exam  Mental Status: Awake and fully alert. Oriented to place and time. Follows  all commands. Speech and language normal.  Cranial Nerves: Pupils equal, briskly reactive to light. Extraocular movements full without nystagmus. Visual fields full to confrontation. Hearing intact and symmetric to finger snap. Facial sensation intact. Face, tongue, palate move normally and symmetrically. Neck flexion and extension normal.  Motor: Normal bulk and tone. Normal strength in all tested extremity muscles.No focal weakness. Bilateral mild essential tremor, mild head titubation  Coordination: Rapid alternating movements normal in all extremities. Finger-to-nose and heel-to-shin performed accurately bilaterally. No dysmetria  Sensory mild length dependent decreased light touch and pinprick at toes, preserved position sense and vibratory Gait and Station: Arises from chair without difficulty. Stance is normal. Gait demonstrates normal stride length and balance . Able to heel, toe and  Unsteady with tandem walk . Ambulates with single-point cane  Romberg negative Reflexes: 1+ and symmetric. Toes downgoing.   DIAGNOSTIC DATA (LABS, IMAGING, TESTING) - I reviewed patient records, labs, notes, testing and imaging myself where available.  Lab Results  Component Value Date   WBC 14.0 (H)  06/13/2014   HGB 10.9 (L) 06/13/2014   HCT 32.7 (L) 06/13/2014   MCV 88.4 06/13/2014   PLT 216 06/13/2014      Component Value Date/Time   NA 132 (L) 06/13/2014 0848   K 3.8 06/13/2014 0848   CL 99 06/13/2014 0848   CO2 19 06/13/2014 0848   GLUCOSE 253 (H) 06/13/2014 0848   BUN 34 (H) 06/13/2014 0848   CREATININE 1.27 (H) 06/13/2014 0848   CALCIUM 8.8 06/13/2014 0848   PROT 7.7 11/26/2013 1450   ALBUMIN 3.7 11/26/2013 1450   AST 43 (H) 11/26/2013 1450   ALT 35 11/26/2013 1450   ALKPHOS 124 (H) 11/26/2013 1450   BILITOT 1.1 11/26/2013 1450   GFRNONAA 40 (L) 06/13/2014 0848   GFRAA 47 (L) 06/13/2014 0848      ASSESSMENT AND PLAN 76 y.o. year old female   1. 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 Kevan NyGates Follow-up in one year or call sooner if problems arise, next with Dr. Terrace ArabiaYan I spent 25 min in total face to face time with the patient more than 50% of which was spent counseling and coordination of care, reviewing test results reviewing medications and discussing and reviewing the diagnosis of tremor and diabetic neuropathy and further treatment options. Reviewed patient handout on neuropathy with patient and daughter Nilda Riggsancy Carolyn Lashondra Vaquerano, [facility] Center[facility]NP, [facility] CenterBC, APRN  [facility] [address], [address] [place], [place] [address] [phone]

## 2016-09-08 ENCOUNTER — Other Ambulatory Visit: Payer: Self-pay | Admitting: Internal Medicine

## 2016-09-08 ENCOUNTER — Ambulatory Visit
Admission: RE | Admit: 2016-09-08 | Discharge: 2016-09-08 | Disposition: A | Discharge status: Home / Self Care | Payer: Medicare Other | Source: Ambulatory Visit | Attending: Internal Medicine | Admitting: Internal Medicine

## 2016-09-08 DIAGNOSIS — M25562 Pain in left knee: Secondary | ICD-10-CM

## 2016-09-08 NOTE — Progress Notes (Signed)
I have reviewed and agreed above plan. 

## 2017-04-14 ENCOUNTER — Other Ambulatory Visit: Payer: Self-pay | Admitting: Obstetrics and Gynecology

## 2017-04-14 DIAGNOSIS — Z1231 Encounter for screening mammogram for malignant neoplasm of breast: Secondary | ICD-10-CM

## 2017-05-28 ENCOUNTER — Ambulatory Visit
Admission: RE | Admit: 2017-05-28 | Discharge: 2017-05-28 | Disposition: A | Discharge status: Home / Self Care | Payer: Medicare Other | Source: Ambulatory Visit | Attending: Obstetrics and Gynecology | Admitting: Obstetrics and Gynecology

## 2017-05-28 DIAGNOSIS — Z1231 Encounter for screening mammogram for malignant neoplasm of breast: Secondary | ICD-10-CM

## 2017-09-08 ENCOUNTER — Ambulatory Visit: Payer: Medicare Other | Admitting: Neurology

## 2017-09-28 ENCOUNTER — Encounter: Payer: Self-pay | Admitting: Neurology

## 2017-09-28 ENCOUNTER — Ambulatory Visit: Payer: Medicare Other | Admitting: Neurology

## 2017-09-28 VITALS — BP 151/66 | HR 58 | Ht 65.5 in | Wt 158.0 lb

## 2017-09-28 DIAGNOSIS — G250 Essential tremor: Secondary | ICD-10-CM | POA: Diagnosis not present

## 2017-09-28 DIAGNOSIS — R159 Full incontinence of feces: Secondary | ICD-10-CM

## 2017-09-28 DIAGNOSIS — F329 Major depressive disorder, single episode, unspecified: Secondary | ICD-10-CM | POA: Diagnosis not present

## 2017-09-28 DIAGNOSIS — F32A Depression, unspecified: Secondary | ICD-10-CM | POA: Insufficient documentation

## 2017-09-28 MED ORDER — CITALOPRAM HYDROBROMIDE 20 MG PO TABS: 20.0000 mg | ORAL_TABLET | Freq: Every day | ORAL | 11 refills | Status: DC

## 2017-09-28 MED ORDER — PRIMIDONE 50 MG PO TABS: 50.0000 mg | ORAL_TABLET | Freq: Three times a day (TID) | ORAL | 4 refills | Status: DC

## 2017-09-28 NOTE — Progress Notes (Signed)
[facility]  PATIENT: Rebecca Flores DOB: 31-Dec-1940  HISTORY OF PRESENT ILLNESS: HISTORY: Rebecca Flores, 77 year old white female returns for followup for essential tremor. She was last seen in our office 09/05/2012. She has been followed here since June 2009 for tremor felt to represent essential tremor, and previously followed by Dr. Thad Ranger. Tremor also involves the head and to a lesser extent the hands. She has been treated with primidone with improvement. She is independent in all activities of daily living and she drives a car without problems.Her father suffered Parkinson's disease, mother died of Alzheimer's disease, there was no tremors in her siblings, she is now taking primidone 50 mg three times daily at 830, 1230 and 7:30 pm.She denies significant side effects from the medications.   UPDATE September 05 2014:Rebecca Flores was diagnosed with diabetes around 2014, she now complains of bilateral toes, feet paresthesia, burning discomfort, difficulty falling to sleep because of her feet pain,  She has tried gabapentin without helping her symptoms, she denies gait difficulty   UPDATE 07/16/2018CM Rebecca Flores, 77 year old female returns for follow-up with a history of essential tremor and bilateral feet paresthesias left worse than right. Her essential tremor is about the same, her head tremor is about the same. She remains on primidone without side effects. When last seen she was on Lyrica for her neuropathy symptoms however she had side effects on the medication for constipation so she stopped it. She has been on amitriptyline prescribed by Dr. Kevan Ny and also low dose gabapentin. She claims her blood sugars run around 200 most days. She had one fall since last seen and hurt her knee. She claims she has some fluid aspirated from the knee. She ambulates with a single-point cane. She returns for reevaluation  UPDATE August 6th 2019: She complains of depression, chronic insomnia,  difficulty sleeping for 2 months. She cries over everything, she lives by herself,   In addition, she complains of 2 years history of bowel and bladder incontinence, gradually getting worse, to the point she is afraid to go out, went to take a long walk, often comes back with incontinence, she has mild bilateral feet paresthesia, unsteady gait  She continue to take primidone 50 mg 3 times for her tremor, which has helped her symptoms   REVIEW OF SYSTEMS: Full 14 system review of systems performed and notable only for those listed, all others are neg:  Tremor, runny nose, restless leg, insomnia, achy muscles, muscle cramps, environmental allergy, itching  ALLERGIES: Allergies  Allergen Reactions  . Nsaids     Severe facial swelling/rash   . Other Rash    Seldane  . Atenolol     Hair loss   . Codeine Nausea Only    Nausea   . Lyrica [Pregabalin]     Constipation, swelling  . Primidone     Emotional lability   . Simvastatin     Leg cramps  . Sulfa Antibiotics     Pruritis      HOME MEDICATIONS: Outpatient Medications Prior to Visit  Medication Sig Dispense Refill  . aspirin EC 81 MG tablet Take 81 mg by mouth 3 (three) times a week. Monday, Wednesday, Friday.    Marland Kitchen BIOTIN FORTE PO Take by mouth daily.    Marland Kitchen glimepiride (AMARYL) 2 MG tablet 2 mg daily.    Marland Kitchen lisinopril (PRINIVIL,ZESTRIL) 5 MG tablet Take 5 mg by mouth every morning.     . loratadine (CLARITIN) 10 MG tablet Take 10 mg by mouth  daily as needed for allergies (congestion).     . metFORMIN (GLUCOPHAGE) 500 MG tablet TAKE 1 TABLET WITH MEALS TWICE A DAY  3  . Multiple Vitamin (MULTIVITAMIN WITH MINERALS) TABS tablet Take 1 tablet by mouth daily.    . Omega-3 Fatty Acids (FISH OIL BURP-LESS PO) Take by mouth daily.    . primidone (MYSOLINE) 50 MG tablet Take 1 tablet (50 mg total) by mouth 3 (three) times daily. Indication: essential tremors 270 tablet 3  . TRESIBA FLEXTOUCH 100 UNIT/ML SOPN FlexTouch Pen INJECT 14  UNITS SUBQ DAILY  6  . amitriptyline (ELAVIL) 25 MG tablet Take 25 mg by mouth at bedtime.    . gabapentin (NEURONTIN) 100 MG capsule 100 mg at bedtime.     No facility-administered medications prior to visit.     PAST MEDICAL HISTORY: Past Medical History:  Diagnosis Date  . Bradycardia   . Colon polyp   . Diabetes mellitus   . Family history of anesthesia complication    sister hard to wake up  . GERD (gastroesophageal reflux disease)   . Hypercholesteremia   . Hypertension   . Kidney stone   . Movement disorder   . Osteoporosis   . Peripheral neuropathy   . Seasonal allergies   . Sepsis (HCC)   . Tobacco use   . Tremors of nervous system     PAST SURGICAL HISTORY: Past Surgical History:  Procedure Laterality Date  . ABDOMINAL HYSTERECTOMY    . APPENDECTOMY    . bladder tacking    . CHOLECYSTECTOMY  1998  . COLONOSCOPY    . HERNIA REPAIR  05/2013  . INGUINAL HERNIA REPAIR Right 06/27/2013   Procedure: RIGHT INGUINAL HERNIA REPAIR WITH MESH;  Surgeon: Velora Heckler, MD;  Location: [facility];  Service: General;  Laterality: Right;  . INSERTION OF MESH Right 06/27/2013   Procedure: INSERTION OF MESH;  Surgeon: Velora Heckler, MD;  Location:  SURGERY CENTER;  Service: General;  Laterality: Right;  . MOUTH SURGERY    . TONSILLECTOMY      FAMILY HISTORY: Family History  Problem Relation Age of Onset  . Dementia Mother   . Diabetes Mellitus II Father   . Prostate cancer Father   . Dementia Sister   . Breast cancer Sister   . Breast cancer Daughter     SOCIAL HISTORY: Social History   Socioeconomic History  . Marital status: Widowed    Spouse name: Not on file  . Number of children: 2  . Years of education: 9TH  . Highest education level: Not on file  Occupational History    Employer: RETIRED  Social Needs  . Financial resource strain: Not on file  . Food insecurity:    Worry: Not on file    Inability: Not on file  . Transportation  needs:    Medical: Not on file    Non-medical: Not on file  Tobacco Use  . Smoking status: Former Smoker    Types: Cigarettes    Last attempt to quit: 04/23/2009    Years since quitting: 8.4  . Smokeless tobacco: Never Used  Substance and Sexual Activity  . Alcohol use: No    Alcohol/week: 0.6 oz    Types: 1 Glasses of wine per week  . Drug use: No  . Sexual activity: Never  Lifestyle  . Physical activity:    Days per week: Not on file    Minutes per session: Not on file  .  Stress: Not on file  Relationships  . Social connections:    Talks on phone: Not on file    Gets together: Not on file    Attends religious service: Not on file    Active member of club or organization: Not on file    Attends meetings of clubs or organizations: Not on file    Relationship status: Not on file  . Intimate partner violence:    Fear of current or ex partner: Not on file    Emotionally abused: Not on file    Physically abused: Not on file    Forced sexual activity: Not on file  Other Topics Concern  . Not on file  Social History Narrative   Patient is widowed with 2 children   09/07/16 Lives alone   Patient has a 9th grade education   Patient is right handed   Patient does not drink caffeine     PHYSICAL EXAM  Vitals:   09/28/17 0957  BP: (!) 151/66  Pulse: (!) 58  Weight: 158 lb (71.7 kg)  Height: 5' 5.5" (1.664 m)   Body mass index is 25.89 kg/m. General: well developed, well nourished, seated, in no evident distress  Head: head normocephalic and atraumatic. Oropharynx benign  Neurologic Exam  Mental Status: Awake and fully alert. Oriented to place and time. Follows all commands. Speech and language normal.  Cranial Nerves: Pupils equal, briskly reactive to light. Extraocular movements full without nystagmus. Visual fields full to confrontation. Hearing intact and symmetric to finger snap. Facial sensation intact. Face, tongue, palate move normally and symmetrically. Neck  flexion and extension normal.  Motor: Normal bulk and tone. Normal strength in all tested extremity muscles.No focal weakness. Bilateral mild essential tremor, mild head titubation  Coordination: Rapid alternating movements normal in all extremities. Finger-to-nose and heel-to-shin performed accurately bilaterally. No dysmetria  Sensory mild length dependent decreased light touch and pinprick at toes, preserved position sense and vibratory Gait and Station: Arises from chair without difficulty. Stance is normal. Gait demonstrates normal stride length and balance . Able to heel, toe and  Unsteady with tandem walk . Ambulates with single-point cane  Romberg negative Reflexes: 1+ and symmetric. Toes downgoing.   DIAGNOSTIC DATA (LABS, IMAGING, TESTING) - I reviewed patient records, labs, notes, testing and imaging myself where available.  Lab Results  Component Value Date   WBC 14.0 (H) 06/13/2014   HGB 10.9 (L) 06/13/2014   HCT 32.7 (L) 06/13/2014   MCV 88.4 06/13/2014   PLT 216 06/13/2014      Component Value Date/Time   NA 132 (L) 06/13/2014 0848   K 3.8 06/13/2014 0848   CL 99 06/13/2014 0848   CO2 19 06/13/2014 0848   GLUCOSE 253 (H) 06/13/2014 0848   BUN 34 (H) 06/13/2014 0848   CREATININE 1.27 (H) 06/13/2014 0848   CALCIUM 8.8 06/13/2014 0848   PROT 7.7 11/26/2013 1450   ALBUMIN 3.7 11/26/2013 1450   AST 43 (H) 11/26/2013 1450   ALT 35 11/26/2013 1450   ALKPHOS 124 (H) 11/26/2013 1450   BILITOT 1.1 11/26/2013 1450   GFRNONAA 40 (L) 06/13/2014 0848   GFRAA 47 (L) 06/13/2014 0848      ASSESSMENT AND PLAN 77 y.o. year old female   1. 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 Kevan NyGates Follow-up in one year or call  sooner if problems arise, next with Dr. Terrace Arabia I spent 25 min in total face to face time with  the patient more than 50% of which was spent counseling and coordination of care, reviewing test results reviewing medications and discussing and reviewing the diagnosis of tremor and diabetic neuropathy and further treatment options. Reviewed patient handout on neuropathy with patient and daughter     PATIENT: Rebecca Flores DOB: 13-Dec-1940  Chief Complaint  Patient presents with  . Tremors    She is here for her yearly follow up.  She uses primidone 50mg  TID, which keep her tremors tolerable.       HISTORICAL  Clayton Bibles, seen in request by  REVIEW OF SYSTEMS: Full 14 system review of systems performed and notable only for as above  ALLERGIES: Allergies  Allergen Reactions  . Nsaids     Severe facial swelling/rash   . Other Rash    Seldane  . Atenolol     Hair loss   . Codeine Nausea Only    Nausea   . Lyrica [Pregabalin]     Constipation, swelling  . Primidone     Emotional lability   . Simvastatin     Leg cramps  . Sulfa Antibiotics     Pruritis      HOME MEDICATIONS: Current Outpatient Medications  Medication Sig Dispense Refill  . aspirin EC 81 MG tablet Take 81 mg by mouth 3 (three) times a week. Monday, Wednesday, Friday.    Marland Kitchen BIOTIN FORTE PO Take by mouth daily.    Marland Kitchen glimepiride (AMARYL) 2 MG tablet 2 mg daily.    Marland Kitchen lisinopril (PRINIVIL,ZESTRIL) 5 MG tablet Take 5 mg by mouth every morning.     . loratadine (CLARITIN) 10 MG tablet Take 10 mg by mouth daily as needed for allergies (congestion).     . metFORMIN (GLUCOPHAGE) 500 MG tablet TAKE 1 TABLET WITH MEALS TWICE A DAY  3  . Multiple Vitamin (MULTIVITAMIN WITH MINERALS) TABS tablet Take 1 tablet by mouth daily.    . Omega-3 Fatty Acids (FISH OIL BURP-LESS PO) Take by mouth daily.    . primidone (MYSOLINE) 50 MG tablet Take 1 tablet (50 mg total) by mouth 3 (three) times daily. Indication: essential tremors 270 tablet 3  . TRESIBA FLEXTOUCH 100 UNIT/ML SOPN FlexTouch Pen INJECT 14 UNITS  SUBQ DAILY  6   No current facility-administered medications for this visit.     PAST MEDICAL HISTORY: Past Medical History:  Diagnosis Date  . Bradycardia   . Colon polyp   . Diabetes mellitus   . Family history of anesthesia complication    sister hard to wake up  . GERD (gastroesophageal reflux disease)   . Hypercholesteremia   . Hypertension   . Kidney stone   . Movement disorder   . Osteoporosis   . Peripheral neuropathy   . Seasonal allergies   . Sepsis (HCC)   . Tobacco use   . Tremors of nervous system     PAST SURGICAL HISTORY: Past Surgical History:  Procedure Laterality Date  . ABDOMINAL HYSTERECTOMY    . APPENDECTOMY    . bladder tacking    . CHOLECYSTECTOMY  1998  . COLONOSCOPY    . HERNIA REPAIR  05/2013  . INGUINAL HERNIA REPAIR Right 06/27/2013   Procedure: RIGHT INGUINAL HERNIA REPAIR WITH MESH;  Surgeon: Velora Heckler, MD;  Location: [facility];  Service: General;  Laterality: Right;  . INSERTION OF MESH Right  06/27/2013   Procedure: INSERTION OF MESH;  Surgeon: Velora Heckler, MD;  Location: [facility];  Service: General;  Laterality: Right;  . MOUTH SURGERY    . TONSILLECTOMY      FAMILY HISTORY: Family History  Problem Relation Age of Onset  . Dementia Mother   . Diabetes Mellitus II Father   . Prostate cancer Father   . Dementia Sister   . Breast cancer Sister   . Breast cancer Daughter     SOCIAL HISTORY: Social History   Socioeconomic History  . Marital status: Widowed    Spouse name: Not on file  . Number of children: 2  . Years of education: 9TH  . Highest education level: Not on file  Occupational History    Employer: RETIRED  Social Needs  . Financial resource strain: Not on file  . Food insecurity:    Worry: Not on file    Inability: Not on file  . Transportation needs:    Medical: Not on file    Non-medical: Not on file  Tobacco Use  . Smoking status: Former Smoker    Types: Cigarettes     Last attempt to quit: 04/23/2009    Years since quitting: 8.4  . Smokeless tobacco: Never Used  Substance and Sexual Activity  . Alcohol use: No    Alcohol/week: 0.6 oz    Types: 1 Glasses of wine per week  . Drug use: No  . Sexual activity: Never  Lifestyle  . Physical activity:    Days per week: Not on file    Minutes per session: Not on file  . Stress: Not on file  Relationships  . Social connections:    Talks on phone: Not on file    Gets together: Not on file    Attends religious service: Not on file    Active member of club or organization: Not on file    Attends meetings of clubs or organizations: Not on file    Relationship status: Not on file  . Intimate partner violence:    Fear of current or ex partner: Not on file    Emotionally abused: Not on file    Physically abused: Not on file    Forced sexual activity: Not on file  Other Topics Concern  . Not on file  Social History Narrative   Patient is widowed with 2 children   09/07/16 Lives alone   Patient has a 9th grade education   Patient is right handed   Patient does not drink caffeine     PHYSICAL EXAM   Vitals:   09/28/17 0957  BP: (!) 151/66  Pulse: (!) 58  Weight: 158 lb (71.7 kg)  Height: 5' 5.5" (1.664 m)    Not recorded      Body mass index is 25.89 kg/m.  PHYSICAL EXAMNIATION:  Gen: NAD, conversant, well nourised, obese, well groomed                     Cardiovascular: Regular rate rhythm, no peripheral edema, warm, nontender. Eyes: Conjunctivae clear without exudates or hemorrhage Neck: Supple, no carotid bruits. Pulmonary: Clear to auscultation bilaterally   NEUROLOGICAL EXAM: Depressed looking elderly female  MENTAL STATUS: Speech:    Speech is normal; fluent and spontaneous with normal comprehension.  Cognition:     Orientation to time, place and person     Normal recent and remote memory     Normal Attention span and concentration  Normal Language, naming,  repeating,spontaneous speech     Fund of knowledge   CRANIAL NERVES: CN II: Visual fields are full to confrontation. Fundoscopic exam is normal with sharp discs and no vascular changes. Pupils are round equal and briskly reactive to light. CN III, IV, VI: extraocular movement are normal. No ptosis. CN V: Facial sensation is intact to pinprick in all 3 divisions bilaterally. Corneal responses are intact.  CN VII: Face is symmetric with normal eye closure and smile. CN VIII: Hearing is normal to rubbing fingers CN IX, X: Palate elevates symmetrically. Phonation is normal. CN XI: Head turning and shoulder shrug are intact CN XII: Tongue is midline with normal movements and no atrophy.  MOTOR: There is no pronator drift of out-stretched arms. Muscle bulk and tone are normal. Muscle strength is normal.  REFLEXES: Reflexes are 3 and symmetric at the biceps, triceps, knees, and trace ankles. Plantar responses are extensor bilaterally  SENSORY: Length dependent decreased to light touch, pinprick, vibratory sensation to bilateral foot  COORDINATION: Rapid alternating movements and fine finger movements are intact. There is no dysmetria on finger-to-nose and heel-knee-shin.    GAIT/STANCE: Posture is normal.  Cautious gait, difficulty with tandem walking Romberg is absent.   DIAGNOSTIC DATA (LABS, IMAGING, TESTING) - I reviewed patient records, labs, notes, testing and imaging myself where available.   ASSESSMENT AND PLAN  Ericca Labra is a 77 y.o. female   Gait abnormality, bilateral feet paresthesia, urinary and bowel incontinence,  Hyperreflexia on examination, distal sensory loss  Need to rule out cervical spondylitic myelopathy,  Proceed with MRI of cervical spine  Depression, insomnia  Will try Celexa 10 mg daily, titrating to 20 mg every day,  Follow-up with her primary care doctor  Essential tremor  Refilled primidone 50 mg 3 times a day   Levert Feinstein, M.D.  Ph.D.  [facility] [address], [address] [place], [place] [address] Ph: [phone] Fax: [phone]  CC: Referring Provider

## 2017-09-30 ENCOUNTER — Telehealth: Payer: Self-pay | Admitting: Neurology

## 2017-09-30 DIAGNOSIS — R202 Paresthesia of skin: Secondary | ICD-10-CM

## 2017-09-30 DIAGNOSIS — R269 Unspecified abnormalities of gait and mobility: Secondary | ICD-10-CM

## 2017-09-30 NOTE — Telephone Encounter (Signed)
Pt requesting a call stating that Dr. Terrace ArabiaYan wanted her to get an MRI scheduled but hasn't heard anything regarding it.

## 2017-09-30 NOTE — Telephone Encounter (Signed)
Noted, thank you

## 2017-09-30 NOTE — Telephone Encounter (Signed)
There is no MRI order in there by Dr. Terrace ArabiaYan.

## 2017-09-30 NOTE — Telephone Encounter (Signed)
Per vo by Dr. Terrace ArabiaYan, place order for MRI cervical spine wo.

## 2017-09-30 NOTE — Telephone Encounter (Signed)
[employer] order sent to GI. No auth they will reach out to the pt to schedule.  °

## 2017-10-05 ENCOUNTER — Ambulatory Visit
Admission: RE | Admit: 2017-10-05 | Discharge: 2017-10-05 | Disposition: A | Discharge status: Home / Self Care | Payer: Medicare Other | Source: Ambulatory Visit | Attending: Neurology | Admitting: Neurology

## 2017-10-05 DIAGNOSIS — R269 Unspecified abnormalities of gait and mobility: Secondary | ICD-10-CM

## 2017-10-05 DIAGNOSIS — R202 Paresthesia of skin: Secondary | ICD-10-CM

## 2017-10-08 ENCOUNTER — Telehealth: Payer: Self-pay | Admitting: Neurology

## 2017-10-08 NOTE — Telephone Encounter (Signed)
Please call patient, MRI of cervical spine showed multilevel mild degenerative changes, there is no evidence of spinal cord or nerve root compression.  IMPRESSION:  Slightly abnormal MRI scan of the cervical spine showing mild disc degenerative changes at C5-6 and C6-7 but without significant compression

## 2017-10-08 NOTE — Telephone Encounter (Signed)
Spoke with pt. and reviewed below MRI report.  She verbalized understanding of same/fim 

## 2017-12-29 ENCOUNTER — Encounter: Payer: Self-pay | Admitting: Neurology

## 2017-12-29 ENCOUNTER — Ambulatory Visit: Payer: Medicare Other | Admitting: Neurology

## 2017-12-29 VITALS — BP 145/65 | HR 53 | Ht 65.5 in | Wt 158.5 lb

## 2017-12-29 DIAGNOSIS — F09 Unspecified mental disorder due to known physiological condition: Secondary | ICD-10-CM | POA: Diagnosis not present

## 2017-12-29 DIAGNOSIS — F32A Depression, unspecified: Secondary | ICD-10-CM

## 2017-12-29 DIAGNOSIS — F329 Major depressive disorder, single episode, unspecified: Secondary | ICD-10-CM | POA: Diagnosis not present

## 2017-12-29 DIAGNOSIS — G6289 Other specified polyneuropathies: Secondary | ICD-10-CM

## 2017-12-29 DIAGNOSIS — G250 Essential tremor: Secondary | ICD-10-CM

## 2017-12-29 MED ORDER — NORTRIPTYLINE HCL 25 MG PO CAPS: 50.0000 mg | ORAL_CAPSULE | Freq: Every day | ORAL | 11 refills | Status: DC

## 2017-12-29 NOTE — Progress Notes (Signed)
[facility]  PATIENT: Rebecca Flores DOB: 12-12-1940  HISTORY OF PRESENT ILLNESS: HISTORY: Rebecca Flores, 77 year old white female returns for followup for essential tremor. She was last seen in our office 09/05/2012. She has been followed here since June 2009 for tremor felt to represent essential tremor, and previously followed by Dr. Thad Ranger. Tremor also involves the head and to a lesser extent the hands. She has been treated with primidone with improvement. She is independent in all activities of daily living and she drives a car without problems.Her father suffered Parkinson's disease, mother died of Alzheimer's disease, there was no tremors in her siblings, she is now taking primidone 50 mg three times daily at 830, 1230 and 7:30 pm.She denies significant side effects from the medications.   UPDATE September 05 2014:Rebecca Flores was diagnosed with diabetes around 2014, she now complains of bilateral toes, feet paresthesia, burning discomfort, difficulty falling to sleep because of her feet pain,  She has tried gabapentin without helping her symptoms, she denies gait difficulty   UPDATE 07/16/2018CM Rebecca Flores, 77 year old female returns for follow-up with a history of essential tremor and bilateral feet paresthesias left worse than right. Her essential tremor is about the same, her head tremor is about the same. She remains on primidone without side effects. When last seen she was on Lyrica for her neuropathy symptoms however she had side effects on the medication for constipation so she stopped it. She has been on amitriptyline prescribed by Dr. Kevan Ny and also low dose gabapentin. She claims her blood sugars run around 200 most days. She had one fall since last seen and hurt her knee. She claims she has some fluid aspirated from the knee. She ambulates with a single-point cane. She returns for reevaluation  UPDATE August 6th 2019: She complains of depression, chronic insomnia,  difficulty sleeping for 2 months. She cries over everything, she lives by herself,   In addition, she complains of 2 years history of bowel and bladder incontinence, gradually getting worse, to the point she is afraid to go out, went to take a long walk, often comes back with incontinence, she has mild bilateral feet paresthesia, unsteady gait  She continue to take primidone 50 mg 3 times for her tremor, which has helped her symptoms  UPDATE Nov 6th 2019: She is accompanied by her daughter at today's clinical visit. Essential tremor: primidone 50mg  tid does help her symptoms, Depression: celexa 20mg  every day was helping her symptoms, but she recently began to take it every other day, daughter noticed worsening anxiety.   Peripheral neuropathy: She continue have bilateral feet paresthesia, numbness tingling, urge to move, mostly at night, sometimes she sleeps with her sock on, she was taking nortriptyline 10 mg, 2 tablets every night will help her sleep better.,  Her mother suffered dementia, she was noted to have gradual worsening memory loss, today's Moca examination was 20 out of 30   REVIEW OF SYSTEMS: Full 14 system review of systems performed and notable only for those listed, all others are neg:  As above ALLERGIES: Allergies  Allergen Reactions  . Nsaids     Severe facial swelling/rash   . Other Rash    Seldane  . Atenolol     Hair loss   . Codeine Nausea Only    Nausea   . Lyrica [Pregabalin]     Constipation, swelling  . Primidone     Emotional lability   . Simvastatin     Leg cramps  . Sulfa  Antibiotics     Pruritis      HOME MEDICATIONS: Outpatient Medications Prior to Visit  Medication Sig Dispense Refill  . aspirin EC 81 MG tablet Take 81 mg by mouth 3 (three) times a week. Monday, Wednesday, Friday.    Marland Kitchen atorvastatin (LIPITOR) 10 MG tablet Take 10 mg by mouth 3 (three) times a week.    Marland Kitchen BIOTIN FORTE PO Take by mouth daily.    . Cholecalciferol (VITAMIN  D3 PO) Take 1 tablet by mouth daily.    . citalopram (CELEXA) 20 MG tablet Take 20 mg by mouth every other day.    Marland Kitchen glimepiride (AMARYL) 2 MG tablet 2 mg daily.    Marland Kitchen lisinopril (PRINIVIL,ZESTRIL) 5 MG tablet Take 5 mg by mouth every morning.     . loratadine (CLARITIN) 10 MG tablet Take 10 mg by mouth daily as needed for allergies (congestion).     . nortriptyline (PAMELOR) 10 MG capsule Take 10 mg by mouth at bedtime.    . Omega-3 Fatty Acids (FISH OIL BURP-LESS PO) Take by mouth daily.    . primidone (MYSOLINE) 50 MG tablet Take 1 tablet (50 mg total) by mouth 3 (three) times daily. Indication: essential tremors 270 tablet 4  . TRESIBA FLEXTOUCH 100 UNIT/ML SOPN FlexTouch Pen INJECT 14 UNITS SUBQ DAILY  6  . VITAMIN E PO Take 1 tablet by mouth daily.    . citalopram (CELEXA) 20 MG tablet Take 1 tablet (20 mg total) by mouth daily. 30 tablet 11  . metFORMIN (GLUCOPHAGE) 500 MG tablet TAKE 1 TABLET WITH MEALS TWICE A DAY  3  . Multiple Vitamin (MULTIVITAMIN WITH MINERALS) TABS tablet Take 1 tablet by mouth daily.     No facility-administered medications prior to visit.     PAST MEDICAL HISTORY: Past Medical History:  Diagnosis Date  . Bradycardia   . Colon polyp   . Diabetes mellitus   . Family history of anesthesia complication    sister hard to wake up  . GERD (gastroesophageal reflux disease)   . Hypercholesteremia   . Hypertension   . Kidney stone   . Movement disorder   . Osteoporosis   . Peripheral neuropathy   . Seasonal allergies   . Sepsis ([facility])   . Tobacco use   . Tremors of nervous system     PAST SURGICAL HISTORY: Past Surgical History:  Procedure Laterality Date  . ABDOMINAL HYSTERECTOMY    . APPENDECTOMY    . bladder tacking    . CHOLECYSTECTOMY  1998  . COLONOSCOPY    . HERNIA REPAIR  05/2013  . INGUINAL HERNIA REPAIR Right 06/27/2013   Procedure: RIGHT INGUINAL HERNIA REPAIR WITH MESH;  Surgeon: Velora Heckler, MD;  Location: [facility];   Service: General;  Laterality: Right;  . INSERTION OF MESH Right 06/27/2013   Procedure: INSERTION OF MESH;  Surgeon: Velora Heckler, MD;  Location: [facility];  Service: General;  Laterality: Right;  . MOUTH SURGERY    . TONSILLECTOMY      FAMILY HISTORY: Family History  Problem Relation Age of Onset  . Dementia Mother   . Diabetes Mellitus II Father   . Prostate cancer Father   . Dementia Sister   . Breast cancer Sister   . Breast cancer Daughter     SOCIAL HISTORY: Social History   Socioeconomic History  . Marital status: Widowed    Spouse name: Not on file  . Number of  children: 2  . Years of education: 9TH  . Highest education level: Not on file  Occupational History    Employer: RETIRED  Social Needs  . Financial resource strain: Not on file  . Food insecurity:    Worry: Not on file    Inability: Not on file  . Transportation needs:    Medical: Not on file    Non-medical: Not on file  Tobacco Use  . Smoking status: Former Smoker    Types: Cigarettes    Last attempt to quit: 04/23/2009    Years since quitting: 8.6  . Smokeless tobacco: Never Used  Substance and Sexual Activity  . Alcohol use: No    Alcohol/week: 1.0 standard drinks    Types: 1 Glasses of wine per week  . Drug use: No  . Sexual activity: Never  Lifestyle  . Physical activity:    Days per week: Not on file    Minutes per session: Not on file  . Stress: Not on file  Relationships  . Social connections:    Talks on phone: Not on file    Gets together: Not on file    Attends religious service: Not on file    Active member of club or organization: Not on file    Attends meetings of clubs or organizations: Not on file    Relationship status: Not on file  . Intimate partner violence:    Fear of current or ex partner: Not on file    Emotionally abused: Not on file    Physically abused: Not on file    Forced sexual activity: Not on file  Other Topics Concern  . Not on file    Social History Narrative   Patient is widowed with 2 children   09/07/16 Lives alone   Patient has a 9th grade education   Patient is right handed   Patient does not drink caffeine     PHYSICAL EXAM  Vitals:   12/29/17 0921  BP: (!) 145/65  Pulse: (!) 53  Weight: 158 lb 8 oz (71.9 kg)  Height: 5' 5.5" (1.664 m)   Body mass index is 25.97 kg/m. General: well developed, well nourished, seated, in no evident distress  Head: head normocephalic and atraumatic. Oropharynx benign  Neurologic Exam  Montreal Cognitive Assessment  12/31/2017  Visuospatial/ Executive (0/5) 4  Naming (0/3) 2  Attention: Read list of digits (0/2) 2  Attention: Read list of letters (0/1) 1  Attention: Serial 7 subtraction starting at 100 (0/3) 0  Language: Repeat phrase (0/2) 2  Language : Fluency (0/1) 1  Abstraction (0/2) 2  Delayed Recall (0/5) 0  Orientation (0/6) 6  Total 20   Cranial Nerves: Pupils equal, briskly reactive to light. Extraocular movements full without nystagmus. Visual fields full to confrontation. Hearing intact and symmetric to finger snap. Facial sensation intact. Face, tongue, palate move normally and symmetrically. Neck flexion and extension normal.  Motor: mild bilateral hans posture tremor, mild head titubation  Coordination: Rapid alternating movements normal in all extremities. Finger-to-nose and heel-to-shin performed accurately bilaterally. No dysmetria  Sensory mild length dependent decreased light touch and pinprick at toes, preserved position sense and vibratory Gait and Station: Arises from chair without difficulty. Careful, steady gait, Reflexes: 1+ and symmetric. Toes downgoing.   DIAGNOSTIC DATA (LABS, IMAGING, TESTING) - I reviewed patient records, labs, notes, testing and imaging myself where available.  Lab Results  Component Value Date   WBC 14.0 (H) 06/13/2014   HGB 10.9 (  L) 06/13/2014   HCT 32.7 (L) 06/13/2014   MCV 88.4 06/13/2014   PLT 216  06/13/2014      Component Value Date/Time   NA 132 (L) 06/13/2014 0848   K 3.8 06/13/2014 0848   CL 99 06/13/2014 0848   CO2 19 06/13/2014 0848   GLUCOSE 253 (H) 06/13/2014 0848   BUN 34 (H) 06/13/2014 0848   CREATININE 1.27 (H) 06/13/2014 0848   CALCIUM 8.8 06/13/2014 0848   PROT 7.7 11/26/2013 1450   ALBUMIN 3.7 11/26/2013 1450   AST 43 (H) 11/26/2013 1450   ALT 35 11/26/2013 1450   ALKPHOS 124 (H) 11/26/2013 1450   BILITOT 1.1 11/26/2013 1450   GFRNONAA 40 (L) 06/13/2014 0848   GFRAA 47 (L) 06/13/2014 0848   Assessment plan:  Essential tremor  Continue primidone 50mg  tid  Dementia  Most consistent with central nervous system degenerative disorder,  Family history of dementia  Laboratory evaluation to rule out treatable etiology  Personally reviewed CT head without contrast in 2013 there was no significant abnormality,  She wants to hold off MRI of the brain at this point  Depression anxiety  I have suggested her to go back on Celexa daily  Peripheral neuropathy  Increased nortriptyline to 25 mg 2 capsule every night for symptoms control, hope this will help her sleep too   Rebecca Flores, M.D. Ph.D.  [facility] [address] [place], [place] [address] Phone: [phone] Fax:      [phone]

## 2017-12-30 ENCOUNTER — Telehealth: Payer: Self-pay | Admitting: *Deleted

## 2017-12-30 LAB — CBC WITH DIFFERENTIAL
Basophils Absolute: 0.1 10*3/uL (ref 0.0–0.2)
Basos: 1 %
EOS (ABSOLUTE): 0.2 10*3/uL (ref 0.0–0.4)
Eos: 2 %
HEMATOCRIT: 38.3 % (ref 34.0–46.6)
Hemoglobin: 12.9 g/dL (ref 11.1–15.9)
Immature Grans (Abs): 0 10*3/uL (ref 0.0–0.1)
Immature Granulocytes: 0 %
LYMPHS ABS: 3.1 10*3/uL (ref 0.7–3.1)
LYMPHS: 41 %
MCH: 29.3 pg (ref 26.6–33.0)
MCHC: 33.7 g/dL (ref 31.5–35.7)
MCV: 87 fL (ref 79–97)
MONOCYTES: 7 %
MONOS ABS: 0.5 10*3/uL (ref 0.1–0.9)
Neutrophils Absolute: 3.7 10*3/uL (ref 1.4–7.0)
Neutrophils: 49 %
RBC: 4.41 x10E6/uL (ref 3.77–5.28)
RDW: 12.3 % (ref 12.3–15.4)
WBC: 7.6 10*3/uL (ref 3.4–10.8)

## 2017-12-30 LAB — COMPREHENSIVE METABOLIC PANEL
A/G RATIO: 2.1 (ref 1.2–2.2)
ALT: 9 IU/L (ref 0–32)
AST: 17 IU/L (ref 0–40)
Albumin: 4.6 g/dL (ref 3.5–4.8)
Alkaline Phosphatase: 81 IU/L (ref 39–117)
BUN/Creatinine Ratio: 27 (ref 12–28)
BUN: 22 mg/dL (ref 8–27)
Bilirubin Total: 0.5 mg/dL (ref 0.0–1.2)
CO2: 21 mmol/L (ref 20–29)
Calcium: 10 mg/dL (ref 8.7–10.3)
Chloride: 104 mmol/L (ref 96–106)
Creatinine, Ser: 0.82 mg/dL (ref 0.57–1.00)
GFR calc Af Amer: 79 mL/min/{1.73_m2} (ref >59)
GFR, EST NON AFRICAN AMERICAN: 69 mL/min/{1.73_m2} (ref >59)
GLOBULIN, TOTAL: 2.2 g/dL (ref 1.5–4.5)
Glucose: 113 mg/dL — ABNORMAL HIGH (ref 65–99)
POTASSIUM: 4.8 mmol/L (ref 3.5–5.2)
SODIUM: 139 mmol/L (ref 134–144)
Total Protein: 6.8 g/dL (ref 6.0–8.5)

## 2017-12-30 LAB — VITAMIN B12: VITAMIN B 12: 340 pg/mL (ref 232–1245)

## 2017-12-30 LAB — RPR: RPR: NONREACTIVE

## 2017-12-30 LAB — TSH: TSH: 2.39 u[IU]/mL (ref 0.450–4.500)

## 2017-12-30 NOTE — Telephone Encounter (Signed)
-----   Message from Levert Feinstein, MD sent at 12/30/2017  8:06 AM EST ----- Please call patient for normal laboratory result, mild elevated glucose level could due to timing of blood sample and last meal.

## 2017-12-30 NOTE — Telephone Encounter (Signed)
Left patient a detailed message, with results, on voicemail (ok per DPR).  Provided our number to call back with any questions.  

## 2017-12-31 DIAGNOSIS — G629 Polyneuropathy, unspecified: Secondary | ICD-10-CM | POA: Insufficient documentation

## 2018-01-20 ENCOUNTER — Other Ambulatory Visit: Payer: Self-pay | Admitting: Neurology

## 2018-04-13 ENCOUNTER — Other Ambulatory Visit: Payer: Self-pay | Admitting: Internal Medicine

## 2018-04-13 DIAGNOSIS — M810 Age-related osteoporosis without current pathological fracture: Secondary | ICD-10-CM

## 2018-04-18 ENCOUNTER — Other Ambulatory Visit: Payer: Self-pay | Admitting: Internal Medicine

## 2018-04-18 DIAGNOSIS — Z1231 Encounter for screening mammogram for malignant neoplasm of breast: Secondary | ICD-10-CM

## 2018-06-13 ENCOUNTER — Other Ambulatory Visit: Payer: Medicare Other

## 2018-06-13 ENCOUNTER — Ambulatory Visit: Payer: Medicare Other

## 2018-06-16 ENCOUNTER — Other Ambulatory Visit: Payer: Medicare Other

## 2018-07-04 ENCOUNTER — Ambulatory Visit: Payer: Medicare Other | Admitting: Neurology

## 2018-08-16 ENCOUNTER — Other Ambulatory Visit: Payer: Self-pay

## 2018-08-16 ENCOUNTER — Ambulatory Visit
Admission: RE | Admit: 2018-08-16 | Discharge: 2018-08-16 | Disposition: A | Discharge status: Home / Self Care | Payer: Medicare Other | Source: Ambulatory Visit | Attending: Internal Medicine | Admitting: Internal Medicine

## 2018-08-16 DIAGNOSIS — M810 Age-related osteoporosis without current pathological fracture: Secondary | ICD-10-CM

## 2018-08-16 DIAGNOSIS — Z1231 Encounter for screening mammogram for malignant neoplasm of breast: Secondary | ICD-10-CM

## 2018-08-24 ENCOUNTER — Ambulatory Visit: Payer: Medicare Other | Admitting: Neurology

## 2018-09-25 ENCOUNTER — Other Ambulatory Visit: Payer: Self-pay | Admitting: Neurology

## 2018-10-04 ENCOUNTER — Encounter: Payer: Self-pay | Admitting: Neurology

## 2018-10-04 ENCOUNTER — Ambulatory Visit (INDEPENDENT_AMBULATORY_CARE_PROVIDER_SITE_OTHER): Payer: Medicare Other | Admitting: Neurology

## 2018-10-04 ENCOUNTER — Other Ambulatory Visit: Payer: Self-pay

## 2018-10-04 VITALS — BP 139/64 | HR 57 | Temp 98.0°F | Ht 65.5 in | Wt 157.5 lb

## 2018-10-04 DIAGNOSIS — F5104 Psychophysiologic insomnia: Secondary | ICD-10-CM

## 2018-10-04 DIAGNOSIS — G250 Essential tremor: Secondary | ICD-10-CM | POA: Diagnosis not present

## 2018-10-04 DIAGNOSIS — F329 Major depressive disorder, single episode, unspecified: Secondary | ICD-10-CM

## 2018-10-04 DIAGNOSIS — F32A Depression, unspecified: Secondary | ICD-10-CM

## 2018-10-04 MED ORDER — PRIMIDONE 50 MG PO TABS: 50.0000 mg | ORAL_TABLET | Freq: Three times a day (TID) | ORAL | 4 refills | Status: DC

## 2018-10-04 MED ORDER — PROPRANOLOL HCL 40 MG PO TABS: 40.0000 mg | ORAL_TABLET | Freq: Two times a day (BID) | ORAL | 11 refills | Status: DC

## 2018-10-04 NOTE — Progress Notes (Signed)
[facility]  PATIENT: Rebecca Flores DOB: 28-Mar-1940  HISTORY OF PRESENT ILLNESS: HISTORY: [name], 78 year old white female returns for followup for essential tremor. She was last seen in our office 09/05/2012. She has been followed here since June 2009 for tremor felt to represent essential tremor, and previously followed by Dr. Thad Rangereynolds. Tremor also involves the head and to a lesser extent the hands. She has been treated with primidone with improvement. She is independent in all activities of daily living and she drives a car without problems.Her father suffered Parkinson's disease, mother died of Alzheimer's disease, there was no tremors in her siblings, she is now taking primidone 50 mg three times daily at 830, 1230 and 7:30 pm.She denies significant side effects from the medications.   UPDATE September 05 2014:Antony BlackbirdYYShe was diagnosed with diabetes around 2014, she now complains of bilateral toes, feet paresthesia, burning discomfort, difficulty falling to sleep because of her feet pain,  She has tried gabapentin without helping her symptoms, she denies gait difficulty   UPDATE 07/16/2018CM Ms. Rebecca Flores, 78 year old female returns for follow-up with a history of essential tremor and bilateral feet paresthesias left worse than right. Her essential tremor is about the same, her head tremor is about the same. She remains on primidone without side effects. When last seen she was on Lyrica for her neuropathy symptoms however she had side effects on the medication for constipation so she stopped it. She has been on amitriptyline prescribed by Dr. Kevan NyGates and also low dose gabapentin. She claims her blood sugars run around 200 most days. She had one fall since last seen and hurt her knee. She claims she has some fluid aspirated from the knee. She ambulates with a single-point cane. She returns for reevaluation  UPDATE August 6th 2019: She complains of depression, chronic insomnia,  difficulty sleeping for 2 months. She cries over everything, she lives by herself,   In addition, she complains of 2 years history of bowel and bladder incontinence, gradually getting worse, to the point she is afraid to go out, went to take a long walk, often comes back with incontinence, she has mild bilateral feet paresthesia, unsteady gait  She continue to take primidone 50 mg 3 times for her tremor, which has helped her symptoms  UPDATE Nov 6th 2019: She is accompanied by her daughter at today's clinical visit. Essential tremor: primidone 50mg  tid does help her symptoms, Depression: celexa 20mg  every day was helping her symptoms, but she recently began to take it every other day, daughter noticed worsening anxiety.   Peripheral neuropathy: She continue have bilateral feet paresthesia, numbness tingling, urge to move, mostly at night, sometimes she sleeps with her sock on, she was taking nortriptyline 10 mg, 2 tablets every night will help her sleep better.,  Her mother suffered dementia, she was noted to have gradual worsening memory loss, today's Moca examination was 20 out of 30  UPDATE October 04 2018: She is accompanied by her daughter-in-law at today's clinical visit, Essential tremor, she is taking primidone 50 mg 3 times a day, worst time is in the morning time," shake like a leaf", she did notice mild improvement after taking medication,  She also complains of worsening memory loss, she has been taking nortriptyline 50 mg at bedtime to help her sleep, and bilateral feet paresthesia  REVIEW OF SYSTEMS: Full 14 system review of systems performed and notable only for those listed, all others are neg:  As above ALLERGIES: Allergies  Allergen Reactions  .  Nsaids     Severe facial swelling/rash   . Other Rash    Seldane  . Atenolol     Hair loss   . Codeine Nausea Only    Nausea   . Lyrica [Pregabalin]     Constipation, swelling  . Primidone     Emotional lability   .  Simvastatin     Leg cramps  . Sulfa Antibiotics     Pruritis      HOME MEDICATIONS: Outpatient Medications Prior to Visit  Medication Sig Dispense Refill  . aspirin EC 81 MG tablet Take 81 mg by mouth 3 (three) times a week. Monday, Wednesday, Friday.    Marland Kitchen atorvastatin (LIPITOR) 10 MG tablet Take 10 mg by mouth 3 (three) times a week.    Marland Kitchen BIOTIN FORTE PO Take by mouth daily.    . Cholecalciferol (VITAMIN D3 PO) Take 1 tablet by mouth daily.    . citalopram (CELEXA) 20 MG tablet TAKE 1 TABLET BY MOUTH EVERY DAY 30 tablet 3  . glimepiride (AMARYL) 2 MG tablet 2 mg daily.    Marland Kitchen lisinopril (PRINIVIL,ZESTRIL) 5 MG tablet Take 5 mg by mouth every morning.     . loratadine (CLARITIN) 10 MG tablet Take 10 mg by mouth daily as needed for allergies (congestion).     . nortriptyline (PAMELOR) 25 MG capsule TAKE 2 CAPSULES (50 MG TOTAL) BY MOUTH AT BEDTIME. 180 capsule 3  . primidone (MYSOLINE) 50 MG tablet Take 1 tablet (50 mg total) by mouth 3 (three) times daily. Indication: essential tremors 270 tablet 4  . TRESIBA FLEXTOUCH 100 UNIT/ML SOPN FlexTouch Pen INJECT 14 UNITS SUBQ DAILY  6  . VITAMIN E PO Take 1 tablet by mouth daily.    . Omega-3 Fatty Acids (FISH OIL BURP-LESS PO) Take by mouth daily.     No facility-administered medications prior to visit.     PAST MEDICAL HISTORY: Past Medical History:  Diagnosis Date  . Bradycardia   . Colon polyp   . Diabetes mellitus   . Family history of anesthesia complication    sister hard to wake up  . GERD (gastroesophageal reflux disease)   . Hypercholesteremia   . Hypertension   . Kidney stone   . Movement disorder   . Osteoporosis   . Peripheral neuropathy   . Seasonal allergies   . Sepsis ([place])   . Tobacco use   . Tremors of nervous system     PAST SURGICAL HISTORY: Past Surgical History:  Procedure Laterality Date  . ABDOMINAL HYSTERECTOMY    . APPENDECTOMY    . bladder tacking    . CHOLECYSTECTOMY  1998  . COLONOSCOPY     . HERNIA REPAIR  05/2013  . INGUINAL HERNIA REPAIR Right 06/27/2013   Procedure: RIGHT INGUINAL HERNIA REPAIR WITH MESH;  Surgeon: Earnstine Regal, MD;  Location: [facility];  Service: General;  Laterality: Right;  . INSERTION OF MESH Right 06/27/2013   Procedure: INSERTION OF MESH;  Surgeon: Earnstine Regal, MD;  Location: [facility];  Service: General;  Laterality: Right;  . MOUTH SURGERY    . TONSILLECTOMY      FAMILY HISTORY: Family History  Problem Relation Age of Onset  . Dementia Mother   . Diabetes Mellitus II Father   . Prostate cancer Father   . Dementia Sister   . Breast cancer Sister   . Breast cancer Daughter     SOCIAL HISTORY: Social History  Socioeconomic History  . Marital status: Widowed    Spouse name: Not on file  . Number of children: 2  . Years of education: 9TH  . Highest education level: Not on file  Occupational History    Employer: RETIRED  Social Needs  . Financial resource strain: Not on file  . Food insecurity    Worry: Not on file    Inability: Not on file  . Transportation needs    Medical: Not on file    Non-medical: Not on file  Tobacco Use  . Smoking status: Former Smoker    Types: Cigarettes    Quit date: 04/23/2009    Years since quitting: 9.4  . Smokeless tobacco: Never Used  Substance and Sexual Activity  . Alcohol use: No    Alcohol/week: 1.0 standard drinks    Types: 1 Glasses of wine per week  . Drug use: No  . Sexual activity: Never  Lifestyle  . Physical activity    Days per week: Not on file    Minutes per session: Not on file  . Stress: Not on file  Relationships  . Social Musicianconnections    Talks on phone: Not on file    Gets together: Not on file    Attends religious service: Not on file    Active member of club or organization: Not on file    Attends meetings of clubs or organizations: Not on file    Relationship status: Not on file  . Intimate partner violence    Fear of current or ex  partner: Not on file    Emotionally abused: Not on file    Physically abused: Not on file    Forced sexual activity: Not on file  Other Topics Concern  . Not on file  Social History Narrative   Patient is widowed with 2 children   09/07/16 Lives alone   Patient has a 9th grade education   Patient is right handed   Patient does not drink caffeine     PHYSICAL EXAM  Vitals:   10/04/18 1009  BP: 139/64  Pulse: (!) 57  Temp: 98 F (36.7 C)  Weight: 157 lb 8 oz (71.4 kg)  Height: 5' 5.5" (1.664 m)   Body mass index is 25.81 kg/m. General: well developed, well nourished, seated, in no evident distress  Head: head normocephalic and atraumatic. Oropharynx benign  Neurologic Exam  MMSE - Mini Mental State Exam 10/04/2018  Orientation to time 4  Orientation to Place 5  Registration 3  Attention/ Calculation 5  Recall 3  Language- name 2 objects 2  Language- repeat 1  Language- follow 3 step command 3  Language- read & follow direction 1  Write a sentence 1  Copy design 1  Total score 29  animal naming 11 Cranial Nerves: Pupils equal, briskly reactive to light. Extraocular movements full without nystagmus. Visual fields full to confrontation. Hearing intact and symmetric to finger snap. Facial sensation intact. Face, tongue, palate move normally and symmetrically. Neck flexion and extension normal.  Motor: mild bilateral hans posture tremor, frequent head titubation  Coordination: Rapid alternating movements normal in all extremities. Finger-to-nose and heel-to-shin performed accurately bilaterally. No dysmetria  Sensory mild length dependent decreased light touch and pinprick at toes, preserved position sense and vibratory Gait and Station: Arises from chair without difficulty. Careful, steady gait, Reflexes: 1+ and symmetric. Toes downgoing.   DIAGNOSTIC DATA (LABS, IMAGING, TESTING) - I reviewed patient records, labs, notes, testing and imaging myself  where  available.  Lab Results  Component Value Date   WBC 7.6 12/29/2017   HGB 12.9 12/29/2017   HCT 38.3 12/29/2017   MCV 87 12/29/2017   PLT 216 06/13/2014      Component Value Date/Time   NA 139 12/29/2017 1045   K 4.8 12/29/2017 1045   CL 104 12/29/2017 1045   CO2 21 12/29/2017 1045   GLUCOSE 113 (H) 12/29/2017 1045   GLUCOSE 253 (H) 06/13/2014 0848   BUN 22 12/29/2017 1045   CREATININE 0.82 12/29/2017 1045   CALCIUM 10.0 12/29/2017 1045   PROT 6.8 12/29/2017 1045   ALBUMIN 4.6 12/29/2017 1045   AST 17 12/29/2017 1045   ALT 9 12/29/2017 1045   ALKPHOS 81 12/29/2017 1045   BILITOT 0.5 12/29/2017 1045   GFRNONAA 69 12/29/2017 1045   GFRAA 79 12/29/2017 1045   Assessment plan:  Essential tremor  Continue primidone 50mg  tid  Add on inderal 40mg  bid  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 Ph.D.  [facility] [address] [place], [place] [address] Phone: [phone] Fax:      [phone]

## 2018-10-04 NOTE — Patient Instructions (Signed)
Stop Nortriptyline  Melatonin at night as needed for sleep

## 2018-10-05 ENCOUNTER — Telehealth: Payer: Self-pay | Admitting: Neurology

## 2018-10-05 NOTE — Telephone Encounter (Signed)
Pt is asking for a call from RN to discuss staying on the medication that helps her Neuropathay

## 2018-10-06 NOTE — Telephone Encounter (Signed)
Pt returning call please call back °

## 2018-10-06 NOTE — Telephone Encounter (Signed)
I spoke to the patient and she is in agreement to stop nortriptyline.  She has melatonin at home and plans to use that when she needs help with sleep.

## 2018-10-06 NOTE — Telephone Encounter (Addendum)
Rebecca Pacas, MD  Desmond Lope, RN        Stop nortriptyline is to try and avoid central nervous system side effect with her complaining of worsening memory loss.

## 2018-10-06 NOTE — Addendum Note (Signed)
Addended by: Desmond Lope on: 10/06/2018 09:23 AM   Modules accepted: Orders

## 2018-10-20 ENCOUNTER — Telehealth: Payer: Self-pay | Admitting: Neurology

## 2018-10-20 NOTE — Telephone Encounter (Signed)
I have spoken to the patient and she is agreeable to stop the medication.  She will continue Primidone.

## 2018-10-20 NOTE — Telephone Encounter (Signed)
Pt has called to inform that the propranolol (INDERAL) 40 MG tablet makes her weak, sleepy, drowsy and short winded.  Pt states she feels this in the morning and at night when she takes it.  Pt states she can not take this medication.  Please call

## 2018-10-20 NOTE — Telephone Encounter (Signed)
It is OK to stop propranolol

## 2018-10-20 NOTE — Addendum Note (Signed)
Addended by: Desmond Lope on: 10/20/2018 05:19 PM   Modules accepted: Orders

## 2019-02-08 ENCOUNTER — Ambulatory Visit: Payer: Medicare Other | Admitting: Neurology

## 2019-02-09 ENCOUNTER — Other Ambulatory Visit: Payer: Self-pay | Admitting: Neurology

## 2019-03-07 ENCOUNTER — Other Ambulatory Visit: Payer: Self-pay | Admitting: Neurology

## 2019-03-20 NOTE — Progress Notes (Signed)
PATIENT: Rebecca Flores DOB: 1940/02/27  REASON FOR VISIT: follow up HISTORY FROM: patient  HISTORY OF PRESENT ILLNESS: Today 03/21/19  HISTORY  HISTORY OF PRESENT ILLNESS: HISTORY: Rebecca Flores, 79 year old white female returns for followup for essential tremor. She was last seen in our office 09/05/2012. She has been followed here since June 2009 for tremor felt to represent essential tremor, and previously followed by Dr. Thad Ranger. Tremor also involves the head and to a lesser extent the hands. She has been treated with primidone with improvement. She is independent in all activities of daily living and she drives a car without problems.Her father suffered Parkinson's disease, mother died of Alzheimer's disease, there was no tremors in her siblings, she is now taking primidone 50 mg three times daily at 830, 1230 and 7:30 pm.She denies significant side effects from the medications.   UPDATE September 05 2014:Rebecca Flores was diagnosed with diabetes around 2014, she now complains of bilateral toes, feet paresthesia, burning discomfort, difficulty falling to sleep because of her feet pain,  She has tried gabapentin without helping her symptoms, she denies gait difficulty   UPDATE 07/16/2018CM Rebecca Flores, 79 year old female returns for follow-up with a history of essential tremor and bilateral feet paresthesias left worse than right. Her essential tremor is about the same, her head tremor is about the same. She remains on primidone without side effects. When last seen she was on Lyrica for her neuropathy symptoms however she had side effects on the medication for constipation so she stopped it. She has been on amitriptyline prescribed by Dr. Kevan Ny and also low dose gabapentin. She claims her blood sugars run around 200 most days. She had one fall since last seen and hurt her knee. She claims she has some fluid aspirated from the knee. She ambulates with a single-point cane. She returns for  reevaluation  UPDATE August 6th 2019: She complains of depression, chronic insomnia, difficulty sleeping for 2 months. She cries over everything, she lives by herself,   In addition, she complains of 2 years history of bowel and bladder incontinence, gradually getting worse, to the point she is afraid to go out, went to take a long walk, often comes back with incontinence, she has mild bilateral feet paresthesia, unsteady gait  She continue to take primidone 50 mg 3 times for her tremor, which has helped her symptoms  UPDATE Nov 6th 2019: She is accompanied by her daughter at today's clinical visit. Essential tremor: primidone 50mg  tid does help her symptoms, Depression: celexa 20mg  every day was helping her symptoms, but she recently began to take it every other day, daughter noticed worsening anxiety.   Peripheral neuropathy: She continue have bilateral feet paresthesia, numbness tingling, urge to move, mostly at night, sometimes she sleeps with her sock on, she was taking nortriptyline 10 mg, 2 tablets every night will help her sleep better.,  Her mother suffered dementia, she was noted to have gradual worsening memory loss, today's [place] examination was 20 out of 30  UPDATE October 04 2018: She is accompanied by her daughter-in-law at today's clinical visit, Essential tremor, she is taking primidone 50 mg 3 times a day, worst time is in the morning time," shake like a leaf", she did notice mild improvement after taking medication,  She also complains of worsening memory loss, she has been taking nortriptyline 50 mg at bedtime to help her sleep, and bilateral feet paresthesia  Update March 21, 2019 SS: She stopped propanolol due to feeling weak, drowsy,  short winded.  She also stopped nortriptyline due to worsening memory.  She is accompanied by her daughter-in-law today, remains on primidone 50 mg 3 times a day for essential tremor, in her head and hands, is worse at lunch or if she  gets nervous.  She lives alone, does her own ADLs, manages her medications, drives a car.  She has a good appetite, sleeps well at night. Family feels she is overall doing well.  Currently has right hip bursitis.  She is overall, doing well, memory score declined 19/30.  REVIEW OF SYSTEMS: Out of a complete 14 system review of symptoms, the patient complains only of the following symptoms, and all other reviewed systems are negative.  Tremor, memory loss  ALLERGIES: Allergies  Allergen Reactions  . Nsaids     Severe facial swelling/rash   . Other Rash    Seldane  . Atenolol     Hair loss   . Codeine Nausea Only    Nausea   . Lyrica [Pregabalin]     Constipation, swelling  . Primidone     Emotional lability   . Simvastatin     Leg cramps  . Sulfa Antibiotics     Pruritis      HOME MEDICATIONS: Outpatient Medications Prior to Visit  Medication Sig Dispense Refill  . atorvastatin (LIPITOR) 10 MG tablet Take 10 mg by mouth 3 (three) times a week.    Marland Kitchen BIOTIN FORTE PO Take by mouth daily.    . Cholecalciferol (VITAMIN D3 PO) Take 1 tablet by mouth daily.    . citalopram (CELEXA) 20 MG tablet TAKE 1 TABLET BY MOUTH EVERY DAY 90 tablet 1  . glimepiride (AMARYL) 2 MG tablet 2 mg daily.    Marland Kitchen lisinopril (PRINIVIL,ZESTRIL) 5 MG tablet Take 5 mg by mouth every morning.     . loratadine (CLARITIN) 10 MG tablet Take 10 mg by mouth daily as needed for allergies (congestion).     . MELATONIN PO Take 1 tablet by mouth at bedtime as needed.    . primidone (MYSOLINE) 50 MG tablet Take 1 tablet (50 mg total) by mouth 3 (three) times daily. Indication: essential tremors 270 tablet 4  . TRESIBA FLEXTOUCH 100 UNIT/ML SOPN FlexTouch Pen INJECT 14 UNITS SUBQ DAILY  6  . VITAMIN E PO Take 1 tablet by mouth daily.    Marland Kitchen aspirin EC 81 MG tablet Take 81 mg by mouth 3 (three) times a week. Monday, Wednesday, Friday.     No facility-administered medications prior to visit.    PAST MEDICAL  HISTORY: Past Medical History:  Diagnosis Date  . Bradycardia   . Colon polyp   . Diabetes mellitus   . Family history of anesthesia complication    sister hard to wake up  . GERD (gastroesophageal reflux disease)   . Hypercholesteremia   . Hypertension   . Kidney stone   . Movement disorder   . Osteoporosis   . Peripheral neuropathy   . Seasonal allergies   . Sepsis (Spray)   . Tobacco use   . Tremors of nervous system     PAST SURGICAL HISTORY: Past Surgical History:  Procedure Laterality Date  . ABDOMINAL HYSTERECTOMY    . APPENDECTOMY    . bladder tacking    . CHOLECYSTECTOMY  1998  . COLONOSCOPY    . HERNIA REPAIR  05/2013  . INGUINAL HERNIA REPAIR Right 06/27/2013   Procedure: RIGHT INGUINAL HERNIA REPAIR WITH MESH;  Surgeon: Earnstine Regal,  MD;  Location: [facility];  Service: General;  Laterality: Right;  . INSERTION OF MESH Right 06/27/2013   Procedure: INSERTION OF MESH;  Surgeon: Velora Heckler, MD;  Location: [facility];  Service: General;  Laterality: Right;  . MOUTH SURGERY    . TONSILLECTOMY      FAMILY HISTORY: Family History  Problem Relation Age of Onset  . Dementia Mother   . Diabetes Mellitus II Father   . Prostate cancer Father   . Dementia Sister   . Breast cancer Sister   . Breast cancer Daughter     SOCIAL HISTORY: Social History   Socioeconomic History  . Marital status: Widowed    Spouse name: Not on file  . Number of children: 2  . Years of education: 9TH  . Highest education level: Not on file  Occupational History    Employer: RETIRED  Tobacco Use  . Smoking status: Former Smoker    Types: Cigarettes    Quit date: 04/23/2009    Years since quitting: 9.9  . Smokeless tobacco: Never Used  Substance and Sexual Activity  . Alcohol use: No    Alcohol/week: 1.0 standard drinks    Types: 1 Glasses of wine per week  . Drug use: No  . Sexual activity: Never  Other Topics Concern  . Not on file  Social  History Narrative   Patient is widowed with 2 children   09/07/16 Lives alone   Patient has a 9th grade education   Patient is right handed   Patient does not drink caffeine   Social Determinants of Health   Financial Resource Strain:   . Difficulty of Paying Living Expenses: Not on file  Food Insecurity:   . Worried About Programme researcher, broadcasting/film/video in the Last Year: Not on file  . Ran Out of Food in the Last Year: Not on file  Transportation Needs:   . Lack of Transportation (Medical): Not on file  . Lack of Transportation (Non-Medical): Not on file  Physical Activity:   . Days of Exercise per Week: Not on file  . Minutes of Exercise per Session: Not on file  Stress:   . Feeling of Stress : Not on file  Social Connections:   . Frequency of Communication with Friends and Family: Not on file  . Frequency of Social Gatherings with Friends and Family: Not on file  . Attends Religious Services: Not on file  . Active Member of Clubs or Organizations: Not on file  . Attends Banker Meetings: Not on file  . Marital Status: Not on file  Intimate Partner Violence:   . Fear of Current or Ex-Partner: Not on file  . Emotionally Abused: Not on file  . Physically Abused: Not on file  . Sexually Abused: Not on file   PHYSICAL EXAM  Vitals:   03/21/19 1406  BP: 133/62  Pulse: (!) 50  Temp: 97.6 F (36.4 C)  Weight: 157 lb 6.4 oz (71.4 kg)  Height: 5\' 5"  (1.651 m)   Body mass index is 26.19 kg/m.  Generalized: Well developed, in no acute distress  MMSE - Mini Mental State Exam 03/21/2019 10/04/2018  Orientation to time 3 4  Orientation to Place 5 5  Registration 1 3  Attention/ Calculation 0 5  Recall 1 3  Language- name 2 objects 2 2  Language- repeat 1 1  Language- follow 3 step command 3 3  Language- read & follow direction 1  1  Write a sentence 1 1  Copy design 1 1  Copy design-comments named 10 animals -  Total score 19 29    Neurological examination   Mentation: Alert oriented to time, place, history taking. Follows all commands speech and language fluent Cranial nerve II-XII: Pupils were equal round reactive to light. Extraocular movements were full, visual field were full on confrontational test. Facial sensation and strength were normal.  Head turning and shoulder shrug were normal and symmetric.  Head tremor noted. Motor: The motor testing reveals 5 over 5 strength of all 4 extremities. Good symmetric motor tone is noted throughout.  Sensory: Sensory testing is intact to soft touch on all 4 extremities. No evidence of extinction is noted.  Coordination: Cerebellar testing reveals good finger-nose-finger and heel-to-shin bilaterally.  Gait and station: Gait is steady, but cautious. Reflexes: Deep tendon reflexes are symmetric and normal bilaterally.   DIAGNOSTIC DATA (LABS, IMAGING, TESTING) - I reviewed patient records, labs, notes, testing and imaging myself where available.  Lab Results  Component Value Date   WBC 7.6 12/29/2017   HGB 12.9 12/29/2017   HCT 38.3 12/29/2017   MCV 87 12/29/2017   PLT 216 06/13/2014      Component Value Date/Time   NA 139 12/29/2017 1045   K 4.8 12/29/2017 1045   CL 104 12/29/2017 1045   CO2 21 12/29/2017 1045   GLUCOSE 113 (H) 12/29/2017 1045   GLUCOSE 253 (H) 06/13/2014 0848   BUN 22 12/29/2017 1045   CREATININE 0.82 12/29/2017 1045   CALCIUM 10.0 12/29/2017 1045   PROT 6.8 12/29/2017 1045   ALBUMIN 4.6 12/29/2017 1045   AST 17 12/29/2017 1045   ALT 9 12/29/2017 1045   ALKPHOS 81 12/29/2017 1045   BILITOT 0.5 12/29/2017 1045   GFRNONAA 69 12/29/2017 1045   GFRAA 79 12/29/2017 1045   Lab Results  Component Value Date   CHOL 147 04/25/2011   HDL 33 (L) 04/25/2011   LDLCALC 53 04/25/2011   TRIG 307 (H) 04/25/2011   CHOLHDL 4.5 04/25/2011   Lab Results  Component Value Date   HGBA1C 7.2 (H) 11/28/2013   Lab Results  Component Value Date   VITAMINB12 340 12/29/2017   Lab  Results  Component Value Date   TSH 2.390 12/29/2017   ASSESSMENT AND PLAN 79 y.o. year old female  has a past medical history of Bradycardia, Colon polyp, Diabetes mellitus, Family history of anesthesia complication, GERD (gastroesophageal reflux disease), Hypercholesteremia, Hypertension, Kidney stone, Movement disorder, Osteoporosis, Peripheral neuropathy, Seasonal allergies, Sepsis (HCC), Tobacco use, and Tremors of nervous system. here with:  Rebecca. Balthazar is overall doing well, has had some decline in her memory, but otherwise has remained stable, lives alone, drives a car, manages her affairs without difficulty.   1.  Essential tremor -She was unable to tolerate Inderal 40 mg BID due to side effect -Continue primidone 50 mg 3 times a day  2.  Mild cognitive impairment -She has stopped nortriptyline due to worsening memory -Decline in memory score 19/30, hard of hearing, nerves may contribute to score -Start Namenda titration to 10 mg twice daily, didn't start Aricept due to concern for diarrhea -Most consistent with central nervous system degenerative disorder -Family history of dementia -Laboratory evaluation in November 2019 showed no treatable etiology, normal B12, TSH, CBC, CMP with mild elevated glucose 113 -CT head without contrast in 2013 no significant abnormality -She wants to hold off on MRI of the brain  3.  Depression,  anxiety -Continue Celexa 20 mg daily  I spent 15 minutes with the patient. 50% of this time was spent discussing her plan of care.   Margie EgeSarah Marlean Mortell, AGNP-C, DNP 03/21/2019, 2:16 PM [facility] [address], [address] [place], [place] [address] [phone]

## 2019-03-21 ENCOUNTER — Encounter: Payer: Self-pay | Admitting: Neurology

## 2019-03-21 ENCOUNTER — Other Ambulatory Visit: Payer: Self-pay

## 2019-03-21 ENCOUNTER — Ambulatory Visit: Payer: Medicare Other | Admitting: Neurology

## 2019-03-21 VITALS — BP 133/62 | HR 50 | Temp 97.6°F | Ht 65.0 in | Wt 157.4 lb

## 2019-03-21 DIAGNOSIS — F09 Unspecified mental disorder due to known physiological condition: Secondary | ICD-10-CM

## 2019-03-21 DIAGNOSIS — G250 Essential tremor: Secondary | ICD-10-CM

## 2019-03-21 DIAGNOSIS — F32A Depression, unspecified: Secondary | ICD-10-CM

## 2019-03-21 DIAGNOSIS — F329 Major depressive disorder, single episode, unspecified: Secondary | ICD-10-CM | POA: Diagnosis not present

## 2019-03-21 MED ORDER — MEMANTINE HCL 5 MG PO TABS: ORAL_TABLET | ORAL | 0 refills | Status: DC

## 2019-03-21 NOTE — Patient Instructions (Signed)
I was great meeting you both today!  Start namenda titration 5 mg tablet for your memory  Take 1 tablet daily for one week, then take 1 tablet twice daily for one week, then take 1 tablet in the morning and 2 in the evening for one week, then take 2 tablets twice daily  Once you complete the titration, call our office, I will send in 10 mg tablets to take 10 mg twice daily   Continue taking Celexa and Primidone   Return in 6 months

## 2019-04-10 ENCOUNTER — Telehealth: Payer: Self-pay

## 2019-04-12 ENCOUNTER — Other Ambulatory Visit: Payer: Self-pay | Admitting: Neurology

## 2019-04-12 NOTE — Telephone Encounter (Signed)
LMVM for pts caregiver to call and let me know how she is doing.  Can make tablet to 10mg  po bid.

## 2019-04-13 MED ORDER — MEMANTINE HCL 10 MG PO TABS: 10.0000 mg | ORAL_TABLET | Freq: Two times a day (BID) | ORAL | 1 refills | Status: DC

## 2019-04-15 ENCOUNTER — Ambulatory Visit: Payer: Medicare Other | Attending: Internal Medicine

## 2019-04-15 DIAGNOSIS — Z23 Encounter for immunization: Secondary | ICD-10-CM

## 2019-04-15 NOTE — Progress Notes (Signed)
   Covid-19 Vaccination Clinic  Name:  Rebecca Flores    MRN: [id number] DOB: February 07, 1941  04/15/2019  Rebecca Flores was observed post Covid-19 immunization for 15 minutes without incidence. She was provided with Vaccine Information Sheet and instruction to access the V-Safe system.   Rebecca Flores was instructed to call 911 with any severe reactions post vaccine: Marland Kitchen Difficulty breathing  . Swelling of your face and throat  . A fast heartbeat  . A bad rash all over your body  . Dizziness and weakness    Immunizations Administered    Name Date Dose VIS Date Route   Pfizer COVID-19 Vaccine 04/15/2019  2:31 PM 0.3 mL 02/03/2019 Intramuscular   Manufacturer: [employer], [employer]   Lot: [id number]   NDC: [address]-7

## 2019-04-17 NOTE — Telephone Encounter (Signed)
Error

## 2019-04-26 NOTE — Progress Notes (Signed)
I have reviewed and agreed above plan. 

## 2019-04-27 ENCOUNTER — Other Ambulatory Visit: Payer: Self-pay | Admitting: Neurology

## 2019-04-28 ENCOUNTER — Telehealth: Payer: Self-pay | Admitting: Neurology

## 2019-04-28 NOTE — Telephone Encounter (Signed)
Pt is needing a refill on her memantine (NAMENDA) 5 MG tablet sent in to the [facility] on [address]

## 2019-05-01 NOTE — Telephone Encounter (Signed)
Filled 90 day supply 03-2019 at [facility].

## 2019-05-01 NOTE — Telephone Encounter (Signed)
I called pharmacy she picked up 04-17-19 3 month supply 10mg  po bid memantine.  She found up in her cabinet.  She did not need refill at this time.

## 2019-05-09 ENCOUNTER — Ambulatory Visit: Payer: Medicare Other | Attending: Internal Medicine

## 2019-05-09 DIAGNOSIS — Z23 Encounter for immunization: Secondary | ICD-10-CM

## 2019-05-09 NOTE — Progress Notes (Signed)
   Covid-19 Vaccination Clinic  Name:  Rebecca Flores    MRN: [id number] DOB: 1940-10-10  05/09/2019  [name] was observed post Covid-19 immunization for 15 minutes without incident. She was provided with Vaccine Information Sheet and instruction to access the V-Safe system.   [name] was instructed to call 911 with any severe reactions post vaccine: Marland Kitchen Difficulty breathing  . Swelling of face and throat  . A fast heartbeat  . A bad rash all over body  . Dizziness and weakness   Immunizations Administered    Name Date Dose VIS Date Route   Pfizer COVID-19 Vaccine 05/09/2019  1:00 PM 0.3 mL 02/03/2019 Intramuscular   Manufacturer: [employer], [employer]   Lot: [id number]   NDC: [address]-8

## 2019-07-05 ENCOUNTER — Other Ambulatory Visit: Payer: Self-pay | Admitting: Neurology

## 2019-07-14 ENCOUNTER — Other Ambulatory Visit: Payer: Self-pay | Admitting: Internal Medicine

## 2019-07-14 DIAGNOSIS — Z1231 Encounter for screening mammogram for malignant neoplasm of breast: Secondary | ICD-10-CM

## 2019-07-28 ENCOUNTER — Other Ambulatory Visit: Payer: Self-pay | Admitting: Neurology

## 2019-09-20 ENCOUNTER — Ambulatory Visit: Payer: Medicare Other | Admitting: Neurology

## 2019-09-20 ENCOUNTER — Other Ambulatory Visit: Payer: Self-pay

## 2019-09-20 ENCOUNTER — Encounter: Payer: Self-pay | Admitting: Neurology

## 2019-09-20 VITALS — BP 131/60 | HR 54 | Wt 160.0 lb

## 2019-09-20 DIAGNOSIS — F09 Unspecified mental disorder due to known physiological condition: Secondary | ICD-10-CM

## 2019-09-20 DIAGNOSIS — G250 Essential tremor: Secondary | ICD-10-CM | POA: Diagnosis not present

## 2019-09-20 MED ORDER — PRIMIDONE 50 MG PO TABS: 100.0000 mg | ORAL_TABLET | Freq: Two times a day (BID) | ORAL | 4 refills | Status: DC

## 2019-09-20 NOTE — Progress Notes (Signed)
HISTORY OF PRESENT ILLNESS  [name], Flores been followed here since June 2009 for tremor felt to represent essential tremor, and previously followed by Dr. Thad Flores. Tremor also involves the head and to a lesser extent the hands. She has been treated with primidone with improvement. She is independent in all activities of daily living and she drives a car without problems.Her father suffered Parkinson's disease, mother died of Alzheimer's disease, there was no tremors in her siblings, she is now taking primidone 50 mg three times daily at 830, 1230 and 7:30 pm.She denies significant side effects from the medications.   UPDATE September 05 2014:She was diagnosed with diabetes around 2014, she now complains of bilateral toes, feet paresthesia, burning discomfort, difficulty falling to sleep because of her feet pain,  She has tried gabapentin without helping her symptoms, she denies gait difficulty  UPDATE August 6th 2019: She complains of depression, chronic insomnia, difficulty sleeping for 2 months. She cries over everything, she lives by herself,   In addition, she complains of 2 years history of bowel and bladder incontinence, gradually getting worse, to the point she is afraid to go out, went to take a long walk, often comes back with incontinence, she has mild bilateral feet paresthesia, unsteady gait  She continue to take primidone 50 mg 3 times for her tremor, which has helped her symptoms  UPDATE Nov 6th 2019: She is accompanied by her daughter at today's clinical visit. Essential tremor: primidone 50mg  tid does help her symptoms, Depression: celexa 20mg  every day was helping her symptoms, but she recently began to take it every other day, daughter noticed worsening anxiety.   Peripheral neuropathy: She continue have bilateral feet paresthesia, numbness tingling, urge to move, mostly at night, sometimes she sleeps with her sock on, she was taking nortriptyline 10 mg, 2 tablets every  night which help her sleep better.,  Her mother suffered dementia, she was noted to have gradual worsening memory loss, today's Moca examination was 20 out of 30  UPDATE October 04 2018: She is accompanied by her daughter-in-law at today's clinical visit, Essential tremor, she is taking primidone 50 mg 3 times a day, worst time is in the morning time," shake like a leaf", she did notice mild improvement after taking medication,  She also complains of worsening memory loss, she has been taking nortriptyline 50 mg at bedtime to help her sleep, and bilateral feet paresthesia  UPDATE September 20 2019: She continues to be bothered by her slow worsening essential tremor, involving upper extremity, and also has frequent head titubation, tried propanolol, could not tolerate it, did used to primidone, went back on it, used to take 50 mg 2 tablets in the morning, 1 tablet at night, when trying to take 3 times a day, she felt the morning dose was not enough, sometimes she becomes so shaky, also with associated anxiety, occasionally hypoglycemia episode  She still lives by herself, still driving, slow worsening memory loss, does not want to take Namenda .  REVIEW OF SYSTEMS: Out of a complete 14 system review of symptoms, the patient complains only of the following symptoms, and all other reviewed systems are negative.   ALLERGIES: Allergies  Allergen Reactions  . Nsaids     Severe facial swelling/rash   . Other Rash    Seldane  . Atenolol     Hair loss   . Codeine Nausea Only    Nausea   . Lyrica [Pregabalin]     Constipation, swelling  .  Primidone     Emotional lability   . Simvastatin     Leg cramps  . Sulfa Antibiotics     Pruritis      HOME MEDICATIONS: Outpatient Medications Prior to Visit  Medication Sig Dispense Refill  . aspirin EC 81 MG tablet Take 81 mg by mouth 3 (three) times a week. Monday, Wednesday, Friday.    Marland Kitchen. atorvastatin (LIPITOR) 10 MG tablet Take 10 mg by mouth  3 (three) times a week.    Marland Kitchen. BIOTIN FORTE PO Take by mouth daily.    . Cholecalciferol (VITAMIN D3 PO) Take 1 tablet by mouth daily.    . citalopram (CELEXA) 20 MG tablet TAKE 1 TABLET BY MOUTH EVERY DAY 90 tablet 1  . glimepiride (AMARYL) 2 MG tablet 2 mg daily.    Marland Kitchen. lisinopril (PRINIVIL,ZESTRIL) 5 MG tablet Take 5 mg by mouth every morning.     . loratadine (CLARITIN) 10 MG tablet Take 10 mg by mouth daily as needed for allergies (congestion).     . MELATONIN PO Take 1 tablet by mouth at bedtime as needed.    . primidone (MYSOLINE) 50 MG tablet Take 1 tablet (50 mg total) by mouth 3 (three) times daily. Indication: essential tremors 270 tablet 4  . TRESIBA FLEXTOUCH 100 UNIT/ML SOPN FlexTouch Pen INJECT 14 UNITS SUBQ DAILY  6  . VITAMIN E PO Take 1 tablet by mouth daily.    . memantine (NAMENDA) 10 MG tablet Take 1 tablet (10 mg total) by mouth 2 (two) times daily. 180 tablet 1  . memantine (NAMENDA) 5 MG tablet Take 1 tablet daily for one week, then take 1 tablet twice daily for one week, then take 1 tablet in the morning and 2 in the evening for one week, then take 2 tablets twice daily 120 tablet 0   No facility-administered medications prior to visit.    PAST MEDICAL HISTORY: Past Medical History:  Diagnosis Date  . Bradycardia   . Colon polyp   . Diabetes mellitus   . Family history of anesthesia complication    sister hard to wake up  . GERD (gastroesophageal reflux disease)   . Hypercholesteremia   . Hypertension   . Kidney stone   . Movement disorder   . Osteoporosis   . Peripheral neuropathy   . Seasonal allergies   . Sepsis (HCC)   . Tobacco use   . Tremors of nervous system     PAST SURGICAL HISTORY: Past Surgical History:  Procedure Laterality Date  . ABDOMINAL HYSTERECTOMY    . APPENDECTOMY    . bladder tacking    . CHOLECYSTECTOMY  1998  . COLONOSCOPY    . HERNIA REPAIR  05/2013  . INGUINAL HERNIA REPAIR Right 06/27/2013   Procedure: RIGHT INGUINAL HERNIA  REPAIR WITH MESH;  Surgeon: Velora Hecklerodd M Gerkin, MD;  Location: [facility];  Service: General;  Laterality: Right;  . INSERTION OF MESH Right 06/27/2013   Procedure: INSERTION OF MESH;  Surgeon: Velora Hecklerodd M Gerkin, MD;  Location: [facility];  Service: General;  Laterality: Right;  . MOUTH SURGERY    . TONSILLECTOMY      FAMILY HISTORY: Family History  Problem Relation Age of Onset  . Dementia Mother   . Diabetes Mellitus II Father   . Prostate cancer Father   . Dementia Sister   . Breast cancer Sister   . Breast cancer Daughter     SOCIAL HISTORY: Social History  Socioeconomic History  . Marital status: Widowed    Spouse name: Not on file  . Number of children: 2  . Years of education: 9TH  . Highest education level: Not on file  Occupational History    Employer: RETIRED  Tobacco Use  . Smoking status: Former Smoker    Types: Cigarettes    Quit date: 04/23/2009    Years since quitting: 10.4  . Smokeless tobacco: Never Used  Substance and Sexual Activity  . Alcohol use: No    Alcohol/week: 1.0 standard drink    Types: 1 Glasses of wine per week  . Drug use: No  . Sexual activity: Never  Other Topics Concern  . Not on file  Social History Narrative   Patient is widowed with 2 children   09/07/16 Lives alone   Patient has a 9th grade education   Patient is right handed   Patient does not drink caffeine   Social Determinants of Health   Financial Resource Strain:   . Difficulty of Paying Living Expenses:   Food Insecurity:   . Worried About Programme researcher, broadcasting/film/video in the Last Year:   . Barista in the Last Year:   Transportation Needs:   . Freight forwarder (Medical):   Marland Kitchen Lack of Transportation (Non-Medical):   Physical Activity:   . Days of Exercise per Week:   . Minutes of Exercise per Session:   Stress:   . Feeling of Stress :   Social Connections:   . Frequency of Communication with Friends and Family:   . Frequency of Social  Gatherings with Friends and Family:   . Attends Religious Services:   . Active Member of Clubs or Organizations:   . Attends Banker Meetings:   Marland Kitchen Marital Status:   Intimate Partner Violence:   . Fear of Current or Ex-Partner:   . Emotionally Abused:   Marland Kitchen Physically Abused:   . Sexually Abused:    PHYSICAL EXAM  Vitals:   09/20/19 1422  BP: (!) 131/60  Pulse: 54  Weight: 160 lb (72.6 kg)   Body mass index is 26.63 kg/m.  Generalized: Well developed, in no acute distress  MMSE - Mini Mental State Exam 09/20/2019 03/21/2019 10/04/2018  Orientation to time 4 3 4   Orientation to Place 4 5 5   Registration 3 1 3   Attention/ Calculation 0 0 5  Recall 0 1 3  Language- name 2 objects 2 2 2   Language- repeat 1 1 1   Language- follow 3 step command 3 3 3   Language- read & follow direction 1 1 1   Write a sentence 1 1 1   Copy design 1 1 1   Copy design-comments - named 10 animals -  Total score 20 19 29       MENTAL STATUS: Speech/Cognition: Awake, alert, normal speech, oriented to history taking and casual conversation.  CRANIAL NERVES: CN II: Visual fields are full to confrontation.  Pupils are round equal and briskly reactive to light. CN III, IV, VI: extraocular movement are normal. No ptosis. CN V: Facial sensation is intact to light touch. CN VII: Face is symmetric with normal eye closure and smile. CN VIII: Hearing is normal to casual conversation CN IX, X: Palate elevates symmetrically. Phonation is normal. CN XI: Head turning and shoulder shrug are intact CN XII: Tongue is midline with normal movements and no atrophy.  MOTOR: She has frequent head titubation, bilateral upper extremity posturing tremor, no rigidity, no weakness,  REFLEXES: Reflexes are 2  and symmetric at the biceps, triceps, knees and ankles. Plantar responses are flexor.  SENSORY: Intact to light touch, pinprick, positional and vibratory sensation at fingers and  toes.  COORDINATION: There is no trunk or limb ataxia.    GAIT/STANCE: Posture is normal.  She can get up from seated position arms crossed, gait is steady with normal steps, base, arm swing and turning.    DIAGNOSTIC DATA (LABS, IMAGING, TESTING) - I reviewed patient records, labs, notes, testing and imaging myself where available.  Lab Results  Component Value Date   WBC 7.6 12/29/2017   HGB 12.9 12/29/2017   HCT 38.3 12/29/2017   MCV 87 12/29/2017   PLT 216 06/13/2014      Component Value Date/Time   NA 139 12/29/2017 1045   K 4.8 12/29/2017 1045   CL 104 12/29/2017 1045   CO2 21 12/29/2017 1045   GLUCOSE 113 (H) 12/29/2017 1045   GLUCOSE 253 (H) 06/13/2014 0848   BUN 22 12/29/2017 1045   CREATININE 0.82 12/29/2017 1045   CALCIUM 10.0 12/29/2017 1045   PROT 6.8 12/29/2017 1045   ALBUMIN 4.6 12/29/2017 1045   AST 17 12/29/2017 1045   ALT 9 12/29/2017 1045   ALKPHOS 81 12/29/2017 1045   BILITOT 0.5 12/29/2017 1045   GFRNONAA 69 12/29/2017 1045   GFRAA 79 12/29/2017 1045   Lab Results  Component Value Date   CHOL 147 04/25/2011   HDL 33 (L) 04/25/2011   LDLCALC 53 04/25/2011   TRIG 307 (H) 04/25/2011   CHOLHDL 4.5 04/25/2011   Lab Results  Component Value Date   HGBA1C 7.2 (H) 11/28/2013   Lab Results  Component Value Date   VITAMINB12 340 12/29/2017   Lab Results  Component Value Date   TSH 2.390 12/29/2017   ASSESSMENT AND PLAN 79 y.o. year old female   1.  Essential tremor -She was unable to tolerate Inderal 40 mg BID due to side effect -We will try higher dose of primidone 50 mg 2 tablets twice a day  2.  Mild cognitive impairment -She has stopped nortriptyline due to worsening memory -Slow worsening memory loss Does not want to continue Namenda -Most consistent with central nervous system degenerative disorder -Family history of dementia -Laboratory evaluation in November 2019 showed no treatable etiology, normal B12, TS -CT head without  contrast in 2013 no significant abnormality  3.  Depression, anxiety -Continue Celexa 20 mg daily  Levert Feinstein, M.D. Ph.D.  [facility] [address] [place], [place] [address] Phone: [phone] Fax:      [phone]

## 2019-09-25 ENCOUNTER — Ambulatory Visit
Admission: RE | Admit: 2019-09-25 | Discharge: 2019-09-25 | Disposition: A | Discharge status: Home / Self Care | Payer: Medicare Other | Source: Ambulatory Visit | Attending: Internal Medicine | Admitting: Internal Medicine

## 2019-09-25 ENCOUNTER — Other Ambulatory Visit: Payer: Self-pay

## 2019-09-25 DIAGNOSIS — Z1231 Encounter for screening mammogram for malignant neoplasm of breast: Secondary | ICD-10-CM

## 2019-09-29 ENCOUNTER — Other Ambulatory Visit: Payer: Self-pay | Admitting: Internal Medicine

## 2019-09-29 DIAGNOSIS — Z1231 Encounter for screening mammogram for malignant neoplasm of breast: Secondary | ICD-10-CM

## 2019-10-12 ENCOUNTER — Other Ambulatory Visit: Payer: Self-pay | Admitting: Neurology

## 2019-10-18 ENCOUNTER — Other Ambulatory Visit: Payer: Self-pay | Admitting: Neurology

## 2019-11-28 ENCOUNTER — Other Ambulatory Visit: Payer: Self-pay | Admitting: Neurology

## 2020-03-12 DIAGNOSIS — J209 Acute bronchitis, unspecified: Secondary | ICD-10-CM | POA: Diagnosis not present

## 2020-03-25 ENCOUNTER — Ambulatory Visit: Payer: Medicare Other | Admitting: Neurology

## 2020-05-28 DIAGNOSIS — E559 Vitamin D deficiency, unspecified: Secondary | ICD-10-CM | POA: Diagnosis not present

## 2020-05-28 DIAGNOSIS — E1165 Type 2 diabetes mellitus with hyperglycemia: Secondary | ICD-10-CM | POA: Diagnosis not present

## 2020-05-28 DIAGNOSIS — I10 Essential (primary) hypertension: Secondary | ICD-10-CM | POA: Diagnosis not present

## 2020-05-28 DIAGNOSIS — Z7984 Long term (current) use of oral hypoglycemic drugs: Secondary | ICD-10-CM | POA: Diagnosis not present

## 2020-05-28 DIAGNOSIS — J449 Chronic obstructive pulmonary disease, unspecified: Secondary | ICD-10-CM | POA: Diagnosis not present

## 2020-05-28 DIAGNOSIS — Z79899 Other long term (current) drug therapy: Secondary | ICD-10-CM | POA: Diagnosis not present

## 2020-06-04 NOTE — Progress Notes (Signed)
HISTORY OF PRESENT ILLNESS  Rebecca Flores, Rebecca Flores been followed here since June 2009 for tremor felt to represent essential tremor, and previously followed by Dr. Thad Ranger. Tremor also involves the head and to a lesser extent the hands. She has been treated with primidone with improvement. She is independent in all activities of daily living and she drives a car without problems.Her father suffered Parkinson's disease, mother died of Alzheimer's disease, there was no tremors in her siblings, she is now taking primidone 50 mg three times daily at 830, 1230 and 7:30 pm.She denies significant side effects from the medications.   UPDATE September 05 2014:She was diagnosed with diabetes around 2014, she now complains of bilateral toes, feet paresthesia, burning discomfort, difficulty falling to sleep because of her feet pain,  She has tried gabapentin without helping her symptoms, she denies gait difficulty  UPDATE August 6th 2019: She complains of depression, chronic insomnia, difficulty sleeping for 2 months. She cries over everything, she lives by herself,   In addition, she complains of 2 years history of bowel and bladder incontinence, gradually getting worse, to the point she is afraid to go out, went to take a long walk, often comes back with incontinence, she has mild bilateral feet paresthesia, unsteady gait  She continue to take primidone 50 mg 3 times for her tremor, which has helped her symptoms  UPDATE Nov 6th 2019: She is accompanied by her daughter at today's clinical visit. Essential tremor: primidone 50mg  tid does help her symptoms, Depression: celexa 20mg  every day was helping her symptoms, but she recently began to take it every other day, daughter noticed worsening anxiety.   Peripheral neuropathy: She continue have bilateral feet paresthesia, numbness tingling, urge to move, mostly at night, sometimes she sleeps with her sock on, she was taking nortriptyline 10 mg, 2 tablets every  night which help her sleep better.,  Her mother suffered dementia, she was noted to have gradual worsening memory loss, today's Moca examination was 20 out of 30  UPDATE October 04 2018: She is accompanied by her daughter-in-law at today's clinical visit, Essential tremor, she is taking primidone 50 mg 3 times a day, worst time is in the morning time," shake like a leaf", she did notice mild improvement after taking medication,  She also complains of worsening memory loss, she has been taking nortriptyline 50 mg at bedtime to help her sleep, and bilateral feet paresthesia  UPDATE September 20 2019: She continues to be bothered by her slow worsening essential tremor, involving upper extremity, and also has frequent head titubation, tried propanolol, could not tolerate it, did used to primidone, went back on it, used to take 50 mg 2 tablets in the morning, 1 tablet at night, when trying to take 3 times a day, she felt the morning dose was not enough, sometimes she becomes so shaky, also with associated anxiety, occasionally hypoglycemia episode  She still lives by herself, still driving, slow worsening memory loss, does not want to take Namenda  Update June 05, 2020 SS: Here today with her daughter and daughter in law, remains on primidone 100 mg twice daily. Lives alone, does everything on her own, drives well.  Claims not sleeping well, has been out of melatonin.  Stopped taking Namenda last year, did not start Aricept due to fear of diarrhea.  Continues with anxiety, certain things get her very upset. Has fallen victim to several spam calls, lost money, daughters are changing her phone #.  Tremor worsens over  time involving both upper extremities, head and neck tremor.   REVIEW OF SYSTEMS: Out of a complete 14 system review of symptoms, the patient complains only of the following symptoms, and all other reviewed systems are negative.  See HPI  ALLERGIES: Allergies  Allergen Reactions  . Nsaids      Severe facial swelling/rash   . Other Rash    Seldane  . Atenolol     Hair loss   . Codeine Nausea Only    Nausea   . Lyrica [Pregabalin]     Constipation, swelling  . Primidone     Emotional lability   . Simvastatin     Leg cramps  . Sulfa Antibiotics     Pruritis      HOME MEDICATIONS: Outpatient Medications Prior to Visit  Medication Sig Dispense Refill  . aspirin EC 81 MG tablet Take 81 mg by mouth 3 (three) times a week. Monday, Wednesday, Friday.    Marland Kitchen atorvastatin (LIPITOR) 10 MG tablet Take 10 mg by mouth 3 (three) times a week.    Marland Kitchen BIOTIN FORTE PO Take by mouth daily.    . Cholecalciferol (VITAMIN D3 PO) Take 1 tablet by mouth daily.    . citalopram (CELEXA) 20 MG tablet TAKE 1 TABLET BY MOUTH EVERY DAY 90 tablet 1  . donepezil (ARICEPT) 5 MG tablet Take 5 mg by mouth at bedtime.    . fenofibrate (TRICOR) 145 MG tablet Take 145 mg by mouth daily.    Marland Kitchen glimepiride (AMARYL) 2 MG tablet 2 mg daily.    Marland Kitchen lisinopril (PRINIVIL,ZESTRIL) 5 MG tablet Take 5 mg by mouth every morning.     . loratadine (CLARITIN) 10 MG tablet Take 10 mg by mouth daily as needed for allergies (congestion).     . MELATONIN PO Take 1 tablet by mouth at bedtime as needed.    . primidone (MYSOLINE) 50 MG tablet Take 2 tablets (100 mg total) by mouth in the morning and at bedtime. Indication: essential tremors 360 tablet 4  . TRESIBA FLEXTOUCH 100 UNIT/ML SOPN FlexTouch Pen INJECT 14 UNITS SUBQ DAILY  6  . valsartan (DIOVAN) 80 MG tablet 1 tablet    . VITAMIN E PO Take 1 tablet by mouth daily.     No facility-administered medications prior to visit.    PAST MEDICAL HISTORY: Past Medical History:  Diagnosis Date  . Bradycardia   . Colon polyp   . Diabetes mellitus   . Family history of anesthesia complication    sister hard to wake up  . GERD (gastroesophageal reflux disease)   . Hypercholesteremia   . Hypertension   . Kidney stone   . Movement disorder   . Osteoporosis   .  Peripheral neuropathy   . Seasonal allergies   . Sepsis (HCC)   . Tobacco use   . Tremors of nervous system     PAST SURGICAL HISTORY: Past Surgical History:  Procedure Laterality Date  . ABDOMINAL HYSTERECTOMY    . APPENDECTOMY    . bladder tacking    . CHOLECYSTECTOMY  1998  . COLONOSCOPY    . HERNIA REPAIR  05/2013  . INGUINAL HERNIA REPAIR Right 06/27/2013   Procedure: RIGHT INGUINAL HERNIA REPAIR WITH MESH;  Surgeon: Velora Heckler, MD;  Location: [facility];  Service: General;  Laterality: Right;  . INSERTION OF MESH Right 06/27/2013   Procedure: INSERTION OF MESH;  Surgeon: Velora Heckler, MD;  Location: [facility];  Service: General;  Laterality: Right;  . MOUTH SURGERY    . TONSILLECTOMY      FAMILY HISTORY: Family History  Problem Relation Age of Onset  . Dementia Mother   . Diabetes Mellitus II Father   . Prostate cancer Father   . Dementia Sister   . Breast cancer Sister   . Breast cancer Daughter     SOCIAL HISTORY: Social History   Socioeconomic History  . Marital status: Widowed    Spouse name: Not on file  . Number of children: 2  . Years of education: 9TH  . Highest education level: Not on file  Occupational History    Employer: RETIRED  Tobacco Use  . Smoking status: Former Smoker    Types: Cigarettes    Quit date: 04/23/2009    Years since quitting: 11.1  . Smokeless tobacco: Never Used  Substance and Sexual Activity  . Alcohol use: No    Alcohol/week: 1.0 standard drink    Types: 1 Glasses of wine per week  . Drug use: No  . Sexual activity: Never  Other Topics Concern  . Not on file  Social History Narrative   Patient is widowed with 2 children   09/07/16 Lives alone   Patient has a 9th grade education   Patient is right handed   Patient does not drink caffeine   Social Determinants of Health   Financial Resource Strain: Not on file  Food Insecurity: Not on file  Transportation Needs: Not on file   Physical Activity: Not on file  Stress: Not on file  Social Connections: Not on file  Intimate Partner Violence: Not on file   PHYSICAL EXAM  Vitals:   06/05/20 1306  BP: (!) 146/72  Pulse: (!) 53  Weight: 160 lb (72.6 kg)  Height: 5\' 5"  (1.651 m)   Body mass index is 26.63 kg/m.  Generalized: Well developed, in no acute distress  MMSE - Mini Mental State Exam 06/05/2020 09/20/2019 03/21/2019  Orientation to time 3 4 3   Orientation to Place 5 4 5   Registration 3 3 1   Attention/ Calculation 0 0 0  Recall 1 0 1  Language- name 2 objects 2 2 2   Language- repeat 1 1 1   Language- follow 3 step command 3 3 3   Language- read & follow direction 1 1 1   Write a sentence 1 1 1   Copy design 1 1 1   Copy design-comments - - named 10 animals  Total score 21 20 19    MENTAL STATUS: Speech/Cognition: Awake, alert, normal speech, oriented to history taking and casual conversation.  CRANIAL NERVES: CN II: Visual fields are full to confrontation.  Pupils are round equal and briskly reactive to light. CN III, IV, VI: extraocular movement are normal. No ptosis. CN V: Facial sensation is intact to light touch. CN VII: Face is symmetric with normal eye closure and smile. CN VIII: Hearing is normal to casual conversation CN IX, X: Palate elevates symmetrically. Phonation is normal. CN XI: Head turning and shoulder shrug are intact  MOTOR: She has frequent head titubation, bilateral upper extremity posturing tremor, no rigidity, no weakness  REFLEXES: Reflexes are 2  and symmetric at the biceps, triceps, knees and ankles. Plantar responses are flexor.  SENSORY: Intact to light touch, pinprick, positional and vibratory sensation at fingers and toes.  COORDINATION: Finger-nose-finger and heel-to-shin is normal, mild tremor with finger-nose-finger bilaterally  GAIT/STANCE: Posture is normal.  She can get up from seated position arms crossed, gait  is steady with normal steps, base, arm  swing and turning.   DIAGNOSTIC DATA (LABS, IMAGING, TESTING) - I reviewed patient records, labs, notes, testing and imaging myself where available.  Lab Results  Component Value Date   WBC 7.6 12/29/2017   HGB 12.9 12/29/2017   HCT 38.3 12/29/2017   MCV 87 12/29/2017   PLT 216 06/13/2014      Component Value Date/Time   NA 139 12/29/2017 1045   K 4.8 12/29/2017 1045   CL 104 12/29/2017 1045   CO2 21 12/29/2017 1045   GLUCOSE 113 (H) 12/29/2017 1045   GLUCOSE 253 (H) 06/13/2014 0848   BUN 22 12/29/2017 1045   CREATININE 0.82 12/29/2017 1045   CALCIUM 10.0 12/29/2017 1045   PROT 6.8 12/29/2017 1045   ALBUMIN 4.6 12/29/2017 1045   AST 17 12/29/2017 1045   ALT 9 12/29/2017 1045   ALKPHOS 81 12/29/2017 1045   BILITOT 0.5 12/29/2017 1045   GFRNONAA 69 12/29/2017 1045   GFRAA 79 12/29/2017 1045   Lab Results  Component Value Date   CHOL 147 04/25/2011   HDL 33 (L) 04/25/2011   LDLCALC 53 04/25/2011   TRIG 307 (H) 04/25/2011   CHOLHDL 4.5 04/25/2011   Lab Results  Component Value Date   HGBA1C 7.2 (H) 11/28/2013   Lab Results  Component Value Date   VITAMINB12 340 12/29/2017   Lab Results  Component Value Date   TSH 2.390 12/29/2017   ASSESSMENT AND PLAN 80 y.o. year old female   1.  Essential tremor -She was unable to tolerate Inderal 40 mg BID due to side effect -Continue primidone 50 mg, 2 tablets twice daily -Check labs today on primidone -Tremor worsens over time, worsens with anxiety  2.  Mild cognitive impairment -Felt nortriptyline worsened memory -Slow worsening memory loss, MMSE stable 21/30 -Did not start Aricept due to fear of diarrhea, unclear if on Namenda currently? Dr.Yan's last note mentioned she wanted to stop it in July 2021, daughters will my chart message  -Most consistent with central nervous system degenerative disorder -Family history of dementia -Laboratory evaluation in November 2019 showed no treatable etiology, normal B12,  TS -CT head without contrast in 2013 no significant abnormality  3.  Depression, anxiety -Continue Celexa 20 mg daily -Continue to work with PCP for better management, talked about PRN Xanax for acute anxiety, will hold off for now -Melatonin at night for sleep  -Follow-up in 8 months or sooner if needed  I spent 30 minutes of face-to-face and non-face-to-face time with patient.  This included previsit chart review, lab review, study review, order entry, electronic health record documentation, patient education.  Rebecca Kluver, DNP  [facility] [address], [address] [place], [place] [address] [phone]

## 2020-06-05 ENCOUNTER — Encounter: Payer: Self-pay | Admitting: Neurology

## 2020-06-05 ENCOUNTER — Ambulatory Visit: Payer: Medicare Other | Admitting: Neurology

## 2020-06-05 VITALS — BP 146/72 | HR 53 | Ht 65.0 in | Wt 160.0 lb

## 2020-06-05 DIAGNOSIS — G250 Essential tremor: Secondary | ICD-10-CM

## 2020-06-05 DIAGNOSIS — F09 Unspecified mental disorder due to known physiological condition: Secondary | ICD-10-CM

## 2020-06-05 DIAGNOSIS — F32A Depression, unspecified: Secondary | ICD-10-CM

## 2020-06-05 NOTE — Patient Instructions (Signed)
Check labs today, keep primidone for tremor Send me a my chart message about what you are currently taking  Memory score is stable  See you back in 8 months

## 2020-06-06 ENCOUNTER — Telehealth: Payer: Self-pay | Admitting: *Deleted

## 2020-06-06 LAB — COMPREHENSIVE METABOLIC PANEL
ALT: 12 IU/L (ref 0–32)
AST: 20 IU/L (ref 0–40)
Albumin/Globulin Ratio: 2 (ref 1.2–2.2)
Albumin: 4.4 g/dL (ref 3.7–4.7)
Alkaline Phosphatase: 64 IU/L (ref 44–121)
BUN/Creatinine Ratio: 28 (ref 12–28)
BUN: 29 mg/dL — ABNORMAL HIGH (ref 8–27)
Bilirubin Total: 0.2 mg/dL (ref 0.0–1.2)
CO2: 20 mmol/L (ref 20–29)
Calcium: 9.6 mg/dL (ref 8.7–10.3)
Chloride: 104 mmol/L (ref 96–106)
Creatinine, Ser: 1.02 mg/dL — ABNORMAL HIGH (ref 0.57–1.00)
Globulin, Total: 2.2 g/dL (ref 1.5–4.5)
Glucose: 174 mg/dL — ABNORMAL HIGH (ref 65–99)
Potassium: 4.1 mmol/L (ref 3.5–5.2)
Sodium: 142 mmol/L (ref 134–144)
Total Protein: 6.6 g/dL (ref 6.0–8.5)
eGFR: 56 mL/min/{1.73_m2} — ABNORMAL LOW (ref >59)

## 2020-06-06 LAB — CBC WITH DIFFERENTIAL/PLATELET
Basophils Absolute: 0.1 10*3/uL (ref 0.0–0.2)
Basos: 1 %
EOS (ABSOLUTE): 0.2 10*3/uL (ref 0.0–0.4)
Eos: 3 %
Hematocrit: 35.4 % (ref 34.0–46.6)
Hemoglobin: 12 g/dL (ref 11.1–15.9)
Immature Grans (Abs): 0 10*3/uL (ref 0.0–0.1)
Immature Granulocytes: 0 %
Lymphocytes Absolute: 2.8 10*3/uL (ref 0.7–3.1)
Lymphs: 34 %
MCH: 29.9 pg (ref 26.6–33.0)
MCHC: 33.9 g/dL (ref 31.5–35.7)
MCV: 88 fL (ref 79–97)
Monocytes Absolute: 0.6 10*3/uL (ref 0.1–0.9)
Monocytes: 7 %
Neutrophils Absolute: 4.6 10*3/uL (ref 1.4–7.0)
Neutrophils: 55 %
Platelets: 220 10*3/uL (ref 150–450)
RBC: 4.02 x10E6/uL (ref 3.77–5.28)
RDW: 12.8 % (ref 11.7–15.4)
WBC: 8.3 10*3/uL (ref 3.4–10.8)

## 2020-06-06 LAB — PRIMIDONE, SERUM
Phenobarbital, Serum: 5 ug/mL — ABNORMAL LOW (ref 15–40)
Primidone Lvl: 8.3 ug/mL (ref 5.0–12.0)

## 2020-06-06 MED ORDER — MEMANTINE HCL 10 MG PO TABS: 10.0000 mg | ORAL_TABLET | Freq: Two times a day (BID) | ORAL | 0 refills | Status: DC

## 2020-06-06 NOTE — Telephone Encounter (Signed)
Daughter in law, tracy called back pt taking donepezil 5mg  po qhs, and memantine 10mg  po bid.  I called [facility] and refill last donepezil 05-04-20 #90, 5 mg po qhs.  Memantine last fill 06-04-2020 #180 10mg  po bid.

## 2020-06-06 NOTE — Telephone Encounter (Signed)
Received request from [facility] [address] about new rx memantine.  Spoke to pt, she gave me her daughter in law # [phone]. I LMVM for her to return call re: this request.

## 2020-06-11 ENCOUNTER — Telehealth: Payer: Self-pay

## 2020-06-11 NOTE — Telephone Encounter (Signed)
-----   Message from Glean Salvo, NP sent at 06/06/2020 12:23 PM EDT ----- Sent my chart message:

## 2020-06-21 DIAGNOSIS — I10 Essential (primary) hypertension: Secondary | ICD-10-CM | POA: Diagnosis not present

## 2020-06-21 DIAGNOSIS — H1013 Acute atopic conjunctivitis, bilateral: Secondary | ICD-10-CM | POA: Diagnosis not present

## 2020-09-17 DIAGNOSIS — L209 Atopic dermatitis, unspecified: Secondary | ICD-10-CM | POA: Diagnosis not present

## 2020-09-25 ENCOUNTER — Ambulatory Visit
Admission: RE | Admit: 2020-09-25 | Discharge: 2020-09-25 | Disposition: A | Discharge status: Home / Self Care | Payer: Medicare Other | Source: Ambulatory Visit | Attending: Internal Medicine | Admitting: Internal Medicine

## 2020-09-25 ENCOUNTER — Other Ambulatory Visit: Payer: Self-pay

## 2020-09-25 DIAGNOSIS — Z1231 Encounter for screening mammogram for malignant neoplasm of breast: Secondary | ICD-10-CM

## 2020-09-30 ENCOUNTER — Other Ambulatory Visit: Payer: Self-pay | Admitting: Internal Medicine

## 2020-09-30 DIAGNOSIS — R928 Other abnormal and inconclusive findings on diagnostic imaging of breast: Secondary | ICD-10-CM

## 2020-10-15 ENCOUNTER — Ambulatory Visit
Admission: RE | Admit: 2020-10-15 | Discharge: 2020-10-15 | Disposition: A | Discharge status: Home / Self Care | Payer: Medicare Other | Source: Ambulatory Visit | Attending: Internal Medicine | Admitting: Internal Medicine

## 2020-10-15 ENCOUNTER — Ambulatory Visit: Admission: RE | Admit: 2020-10-15 | Payer: Medicare Other | Source: Ambulatory Visit

## 2020-10-15 ENCOUNTER — Other Ambulatory Visit: Payer: Self-pay

## 2020-10-15 DIAGNOSIS — R928 Other abnormal and inconclusive findings on diagnostic imaging of breast: Secondary | ICD-10-CM

## 2020-10-15 DIAGNOSIS — R922 Inconclusive mammogram: Secondary | ICD-10-CM | POA: Diagnosis not present

## 2020-11-18 DIAGNOSIS — E1165 Type 2 diabetes mellitus with hyperglycemia: Secondary | ICD-10-CM | POA: Diagnosis not present

## 2020-11-18 DIAGNOSIS — E559 Vitamin D deficiency, unspecified: Secondary | ICD-10-CM | POA: Diagnosis not present

## 2020-11-18 DIAGNOSIS — Z23 Encounter for immunization: Secondary | ICD-10-CM | POA: Diagnosis not present

## 2020-11-18 DIAGNOSIS — I10 Essential (primary) hypertension: Secondary | ICD-10-CM | POA: Diagnosis not present

## 2020-11-18 DIAGNOSIS — E1342 Other specified diabetes mellitus with diabetic polyneuropathy: Secondary | ICD-10-CM | POA: Diagnosis not present

## 2020-11-18 DIAGNOSIS — Z5181 Encounter for therapeutic drug level monitoring: Secondary | ICD-10-CM | POA: Diagnosis not present

## 2020-11-18 DIAGNOSIS — J449 Chronic obstructive pulmonary disease, unspecified: Secondary | ICD-10-CM | POA: Diagnosis not present

## 2020-11-25 ENCOUNTER — Other Ambulatory Visit: Payer: Self-pay | Admitting: Neurology

## 2020-12-11 DIAGNOSIS — H35363 Drusen (degenerative) of macula, bilateral: Secondary | ICD-10-CM | POA: Diagnosis not present

## 2020-12-11 DIAGNOSIS — H524 Presbyopia: Secondary | ICD-10-CM | POA: Diagnosis not present

## 2020-12-11 DIAGNOSIS — E119 Type 2 diabetes mellitus without complications: Secondary | ICD-10-CM | POA: Diagnosis not present

## 2020-12-11 DIAGNOSIS — H26492 Other secondary cataract, left eye: Secondary | ICD-10-CM | POA: Diagnosis not present

## 2020-12-11 DIAGNOSIS — Z961 Presence of intraocular lens: Secondary | ICD-10-CM | POA: Diagnosis not present

## 2020-12-18 DIAGNOSIS — J449 Chronic obstructive pulmonary disease, unspecified: Secondary | ICD-10-CM | POA: Diagnosis not present

## 2020-12-18 DIAGNOSIS — Z23 Encounter for immunization: Secondary | ICD-10-CM | POA: Diagnosis not present

## 2020-12-18 DIAGNOSIS — F5109 Other insomnia not due to a substance or known physiological condition: Secondary | ICD-10-CM | POA: Diagnosis not present

## 2020-12-18 DIAGNOSIS — I10 Essential (primary) hypertension: Secondary | ICD-10-CM | POA: Diagnosis not present

## 2020-12-18 DIAGNOSIS — E1165 Type 2 diabetes mellitus with hyperglycemia: Secondary | ICD-10-CM | POA: Diagnosis not present

## 2020-12-18 DIAGNOSIS — Z7984 Long term (current) use of oral hypoglycemic drugs: Secondary | ICD-10-CM | POA: Diagnosis not present

## 2021-01-29 ENCOUNTER — Ambulatory Visit: Payer: Medicare Other | Admitting: Neurology

## 2021-03-27 DIAGNOSIS — Z79899 Other long term (current) drug therapy: Secondary | ICD-10-CM | POA: Diagnosis not present

## 2021-03-27 DIAGNOSIS — I10 Essential (primary) hypertension: Secondary | ICD-10-CM | POA: Diagnosis not present

## 2021-03-27 DIAGNOSIS — Z1389 Encounter for screening for other disorder: Secondary | ICD-10-CM | POA: Diagnosis not present

## 2021-03-27 DIAGNOSIS — E1165 Type 2 diabetes mellitus with hyperglycemia: Secondary | ICD-10-CM | POA: Diagnosis not present

## 2021-03-27 DIAGNOSIS — F5109 Other insomnia not due to a substance or known physiological condition: Secondary | ICD-10-CM | POA: Diagnosis not present

## 2021-03-27 DIAGNOSIS — E1342 Other specified diabetes mellitus with diabetic polyneuropathy: Secondary | ICD-10-CM | POA: Diagnosis not present

## 2021-03-27 DIAGNOSIS — E559 Vitamin D deficiency, unspecified: Secondary | ICD-10-CM | POA: Diagnosis not present

## 2021-03-27 DIAGNOSIS — Z0000 Encounter for general adult medical examination without abnormal findings: Secondary | ICD-10-CM | POA: Diagnosis not present

## 2021-03-27 DIAGNOSIS — J449 Chronic obstructive pulmonary disease, unspecified: Secondary | ICD-10-CM | POA: Diagnosis not present

## 2021-03-27 DIAGNOSIS — Z7984 Long term (current) use of oral hypoglycemic drugs: Secondary | ICD-10-CM | POA: Diagnosis not present

## 2021-04-08 ENCOUNTER — Ambulatory Visit: Payer: Medicare Other | Admitting: Neurology

## 2021-04-08 ENCOUNTER — Encounter: Payer: Self-pay | Admitting: Neurology

## 2021-04-08 VITALS — BP 167/64 | HR 50 | Ht 65.0 in | Wt 160.0 lb

## 2021-04-08 DIAGNOSIS — G250 Essential tremor: Secondary | ICD-10-CM

## 2021-04-08 DIAGNOSIS — F09 Unspecified mental disorder due to known physiological condition: Secondary | ICD-10-CM

## 2021-04-08 DIAGNOSIS — F32A Depression, unspecified: Secondary | ICD-10-CM | POA: Diagnosis not present

## 2021-04-08 NOTE — Patient Instructions (Addendum)
Check to see if taking Zoloft, Wellbutrin, Trazodone, all can help the mood Can take primidone 50 mg, 2 tablets twice daily for tremor See you back in 6 months

## 2021-04-08 NOTE — Progress Notes (Signed)
HISTORY OF PRESENT ILLNESS  [name], Flores been followed here since June 2009 for tremor felt to represent essential tremor, and previously followed by Dr. Thad Ranger. Tremor also involves the head and to a lesser extent the hands. She has been treated with primidone with improvement. She is independent in all activities of daily living and she drives a car without problems.Her father suffered Parkinson's disease, mother died of Alzheimer's disease, there was no tremors in her siblings, she is now taking primidone 50 mg three times daily at 830, 1230 and 7:30 pm.She denies significant side effects from the medications.     UPDATE September 05 2014:She was diagnosed with diabetes around 2014, she now complains of bilateral toes, feet paresthesia, burning discomfort, difficulty falling to sleep because of her feet pain,  She has tried gabapentin without helping her symptoms, she denies gait difficulty  UPDATE August 6th 2019: She complains of depression, chronic insomnia, difficulty sleeping for 2 months. She cries over everything, she lives by herself,    In addition, she complains of 2 years history of bowel and bladder incontinence, gradually getting worse, to the point she is afraid to go out, went to take a long walk, often comes back with incontinence, she has mild bilateral feet paresthesia, unsteady gait   She continue to take primidone 50 mg 3 times for her tremor, which has helped her symptoms   UPDATE Nov 6th 2019: She is accompanied by her daughter at today's clinical visit. Essential tremor: primidone 50mg  tid does help her symptoms, Depression: celexa 20mg  every day was helping her symptoms, but she recently began to take it every other day, daughter noticed worsening anxiety.   Peripheral neuropathy: She continue have bilateral feet paresthesia, numbness tingling, urge to move, mostly at night, sometimes she sleeps with her sock on, she was taking nortriptyline 10 mg, 2 tablets every  night which help her sleep better.,  Her mother suffered dementia, she was noted to have gradual worsening memory loss, today's [place] examination was 20 out of 30   UPDATE October 04 2018: She is accompanied by her daughter-in-law at today's clinical visit, Essential tremor, she is taking primidone 50 mg 3 times a day, worst time is in the morning time," shake like a leaf", she did notice mild improvement after taking medication,   She also complains of worsening memory loss, she has been taking nortriptyline 50 mg at bedtime to help her sleep, and bilateral feet paresthesia  UPDATE September 20 2019: She continues to be bothered by her slow worsening essential tremor, involving upper extremity, and also has frequent head titubation, tried propanolol, could not tolerate it, did used to primidone, went back on it, used to take 50 mg 2 tablets in the morning, 1 tablet at night, when trying to take 3 times a day, she felt the morning dose was not enough, sometimes she becomes so shaky, also with associated anxiety, occasionally hypoglycemia episode  She still lives by herself, still driving, slow worsening memory loss, does not want to take Namenda  Update June 05, 2020 SS: Here today with her daughter and daughter in law, remains on primidone 100 mg twice daily. Lives alone, does everything on her own, drives well.  Claims not sleeping well, has been out of melatonin.  Stopped taking Namenda last year, did not start Aricept due to fear of diarrhea.  Continues with anxiety, certain things get her very upset. Has fallen victim to several spam calls, lost money, daughters are changing  her phone #.  Tremor worsens over time involving both upper extremities, head and neck tremor.   Update April 08, 2021 SS: Here today with daughter, Steward Drone. Lives alone, does this well, she drives, she has a boyfriend, he stresses her out, worries about his aging health. With any anxiety or stress, she'll start crying, have  panic attack. People try to call and scam her, worries about this. Is a people pleasing person. Tremor continues, head, jaw, both hands, not sure if taking Primidone 50 mg, 2 tablets twice daily. I was able to see PCP notes, added Zoloft, Trazodone, Wellbutrin. She stopped Celexa, but isn't taking Zoloft. On Aricept 5 mg, Namenda 10 mg BID. MMSE 22/30.  REVIEW OF SYSTEMS: Out of a complete 14 system review of symptoms, the patient complains only of the following symptoms, and all other reviewed systems are negative.  See HPI  ALLERGIES: Allergies  Allergen Reactions   Nsaids     Severe facial swelling/rash    Other Rash    Seldane   Atenolol     Hair loss    Codeine Nausea Only    Nausea    Lyrica [Pregabalin]     Constipation, swelling   Primidone     Emotional lability    Simvastatin     Leg cramps   Sulfa Antibiotics     Pruritis      HOME MEDICATIONS: Outpatient Medications Prior to Visit  Medication Sig Dispense Refill   aspirin EC 81 MG tablet Take 81 mg by mouth 3 (three) times a week. Monday, Wednesday, Friday.     atorvastatin (LIPITOR) 10 MG tablet Take 10 mg by mouth 3 (three) times a week.     BIOTIN FORTE PO Take by mouth daily.     Cholecalciferol (VITAMIN D3 PO) Take 1 tablet by mouth daily.     citalopram (CELEXA) 20 MG tablet TAKE 1 TABLET BY MOUTH EVERY DAY 90 tablet 1   donepezil (ARICEPT) 5 MG tablet Take 5 mg by mouth at bedtime.     fenofibrate (TRICOR) 145 MG tablet Take 145 mg by mouth daily.     glimepiride (AMARYL) 2 MG tablet 2 mg daily.     lisinopril (PRINIVIL,ZESTRIL) 5 MG tablet Take 5 mg by mouth every morning.      loratadine (CLARITIN) 10 MG tablet Take 10 mg by mouth daily as needed for allergies (congestion).      MELATONIN PO Take 1 tablet by mouth at bedtime as needed.     memantine (NAMENDA) 10 MG tablet Take 1 tablet (10 mg total) by mouth 2 (two) times daily. 180 tablet 0   primidone (MYSOLINE) 50 MG tablet TAKE 2 TABLETS (100  MG TOTAL) BY MOUTH IN THE MORNING AND AT BEDTIME. INDICATION: ESSENTIAL TREMORS 360 tablet 4   TRESIBA FLEXTOUCH 100 UNIT/ML SOPN FlexTouch Pen INJECT 14 UNITS SUBQ DAILY  6   valsartan (DIOVAN) 80 MG tablet 1 tablet     VITAMIN E PO Take 1 tablet by mouth daily.     No facility-administered medications prior to visit.    PAST MEDICAL HISTORY: Past Medical History:  Diagnosis Date   Bradycardia    Colon polyp    Diabetes mellitus    Family history of anesthesia complication    sister hard to wake up   GERD (gastroesophageal reflux disease)    Hypercholesteremia    Hypertension    Kidney stone    Movement disorder    Osteoporosis  Peripheral neuropathy    Seasonal allergies    Sepsis (HCC)    Tobacco use    Tremors of nervous system     PAST SURGICAL HISTORY: Past Surgical History:  Procedure Laterality Date   ABDOMINAL HYSTERECTOMY     APPENDECTOMY     bladder tacking     CHOLECYSTECTOMY  1998   COLONOSCOPY     HERNIA REPAIR  05/2013   INGUINAL HERNIA REPAIR Right 06/27/2013   Procedure: RIGHT INGUINAL HERNIA REPAIR WITH MESH;  Surgeon: Velora Heckler, MD;  Location: [facility];  Service: General;  Laterality: Right;   INSERTION OF MESH Right 06/27/2013   Procedure: INSERTION OF MESH;  Surgeon: Velora Heckler, MD;  Location: [facility];  Service: General;  Laterality: Right;   MOUTH SURGERY     TONSILLECTOMY      FAMILY HISTORY: Family History  Problem Relation Age of Onset   Dementia Mother    Diabetes Mellitus II Father    Prostate cancer Father    Dementia Sister    Breast cancer Sister    Breast cancer Daughter     SOCIAL HISTORY: Social History   Socioeconomic History   Marital status: Widowed    Spouse name: Not on file   Number of children: 2   Years of education: 9TH   Highest education level: Not on file  Occupational History    Employer: RETIRED  Tobacco Use   Smoking status: Former    Types: Cigarettes    Quit  date: 04/23/2009    Years since quitting: 11.9   Smokeless tobacco: Never  Substance and Sexual Activity   Alcohol use: No    Alcohol/week: 1.0 standard drink    Types: 1 Glasses of wine per week   Drug use: No   Sexual activity: Never  Other Topics Concern   Not on file  Social History Narrative   Patient is widowed with 2 children   09/07/16 Lives alone   Patient has a 9th grade education   Patient is right handed   Patient does not drink caffeine   Social Determinants of Corporate investment banker Strain: Not on file  Food Insecurity: Not on file  Transportation Needs: Not on file  Physical Activity: Not on file  Stress: Not on file  Social Connections: Not on file  Intimate Partner Violence: Not on file   PHYSICAL EXAM  Vitals:   04/08/21 1411  BP: (!) 167/64  Pulse: (!) 50  Weight: 160 lb (72.6 kg)  Height: 5\' 5"  (1.651 m)    Body mass index is 26.63 kg/m.  Generalized: Well developed, in no acute distress  MMSE - Mini Mental State Exam 04/08/2021 06/05/2020 09/20/2019  Orientation to time 4 3 4   Orientation to Place 5 5 4   Registration 3 3 3   Attention/ Calculation 0 0 0  Recall 1 1 0  Language- name 2 objects 2 2 2   Language- repeat 1 1 1   Language- follow 3 step command 3 3 3   Language- read & follow direction 1 1 1   Write a sentence 1 1 1   Copy design 1 1 1   Copy design-comments - - -  Total score 22 21 20    Physical Exam  General: The patient is alert and cooperative at the time of the examination. Tearful at times. Relies on her daughter for some history. Appears anxious.  Skin: No significant peripheral edema is noted.  Neurologic Exam  Mental  status: The patient is alert and oriented x 3 at the time of the examination. The patient has apparent normal recent and remote memory, with an apparently normal attention span and concentration ability.  Cranial nerves: Facial symmetry is present. Speech is normal, no aphasia or dysarthria is noted.  Extraocular movements are full. Visual fields are full. Head tremor, jaw tremor noted, mild-moderate.  Motor: The patient has good strength in all 4 extremities.   Sensory examination: Soft touch sensation is symmetric on the face, arms, and legs.  Coordination: The patient has good finger-nose-finger and heel-to-shin bilaterally.  Mild to moderate tremor with finger-nose-finger, greater on the left than the right.  Gait and station: The patient has a normal gait  Reflexes: Deep tendon reflexes are symmetric.  DIAGNOSTIC DATA (LABS, IMAGING, TESTING) - I reviewed patient records, labs, notes, testing and imaging myself where available.  Lab Results  Component Value Date   WBC 8.3 06/05/2020   HGB 12.0 06/05/2020   HCT 35.4 06/05/2020   MCV 88 06/05/2020   PLT 220 06/05/2020      Component Value Date/Time   NA 142 06/05/2020 1404   K 4.1 06/05/2020 1404   CL 104 06/05/2020 1404   CO2 20 06/05/2020 1404   GLUCOSE 174 (H) 06/05/2020 1404   GLUCOSE 253 (H) 06/13/2014 0848   BUN 29 (H) 06/05/2020 1404   CREATININE 1.02 (H) 06/05/2020 1404   CALCIUM 9.6 06/05/2020 1404   PROT 6.6 06/05/2020 1404   ALBUMIN 4.4 06/05/2020 1404   AST 20 06/05/2020 1404   ALT 12 06/05/2020 1404   ALKPHOS 64 06/05/2020 1404   BILITOT 0.2 06/05/2020 1404   GFRNONAA 69 12/29/2017 1045   GFRAA 79 12/29/2017 1045   Lab Results  Component Value Date   CHOL 147 04/25/2011   HDL 33 (L) 04/25/2011   LDLCALC 53 04/25/2011   TRIG 307 (H) 04/25/2011   CHOLHDL 4.5 04/25/2011   Lab Results  Component Value Date   HGBA1C 7.2 (H) 11/28/2013   Lab Results  Component Value Date   VITAMINB12 340 12/29/2017   Lab Results  Component Value Date   TSH 2.390 12/29/2017   ASSESSMENT AND PLAN 81 y.o. year old female   1.  Essential tremor -Check her medication, unclear if taking primidone 50 mg 2 tablets daily, I think her anxiety is impacting her tremor  -Unable to tolerate Inderal 40 mg BID due to  side effect -April 2022 primidone level was 8.3  2.  Mild cognitive impairment -Slow worsening memory loss, anxiety contributes, MMSE stable 22/30 -Continue Aricept, on 5 mg daily, Namenda 10 mg twice daily -Before doing higher dose Aricept, she will ensure on proper anxiety medications, cause more diarrhea? -Most consistent with central nervous system degenerative disorder -Family history of dementia -Laboratory evaluation in November 2019 showed no treatable etiology, normal B12, TS -CT head without contrast in 2013 no significant abnormality  3.  Depression, anxiety -Continue working with PCP for management -Supposed to be taking Zoloft, trazodone, Wellbutrin -Follow-up in 6 months or sooner if needed  Margie EgeSarah Dylon Correa, Edrick OhAGNP-C, DNP  [facility] [address], [address] [place], [place] [id number] [phone]

## 2021-04-09 ENCOUNTER — Encounter: Payer: Self-pay | Admitting: *Deleted

## 2021-04-09 ENCOUNTER — Telehealth: Payer: Self-pay | Admitting: Neurology

## 2021-04-09 NOTE — Telephone Encounter (Signed)
I called the patient's primary number. Luckily, her daughter Steward Drone, was there with her. We were able to confirm the following meds:  1) buproprion (Wellbutrin XL) 150mg , 1 tab QD 2) sertraline (Zoloft) 25mg , 1 tab QD 3) trazodone 50mg , 1-2 tabs QHS PRN (not started yet) 4) primidone 50mg , 2 tabs BID  The patient and her daughter plan to stop by one of her [facility] physician offices to update her DPR.  Her medication list has been updated in Epic. They are aware if the medications above need a dose alteration, they should contact her PCP who is prescribing them.

## 2021-04-09 NOTE — Telephone Encounter (Signed)
Pt's daughter, Silvio Clayman (pt do not have DPR on file) Sarah ask for this information at last OV. She have been prescribe Trazodone, Zoloft, Wellpurin, but has not started the Trazodone. She is taking her Primidone as prescribed.  Should she be taking in the middle of  the day as well.  Would like a call from the nurse.

## 2021-07-28 DIAGNOSIS — E1165 Type 2 diabetes mellitus with hyperglycemia: Secondary | ICD-10-CM | POA: Diagnosis not present

## 2021-07-28 DIAGNOSIS — E1342 Other specified diabetes mellitus with diabetic polyneuropathy: Secondary | ICD-10-CM | POA: Diagnosis not present

## 2021-07-28 DIAGNOSIS — E559 Vitamin D deficiency, unspecified: Secondary | ICD-10-CM | POA: Diagnosis not present

## 2021-07-28 DIAGNOSIS — J449 Chronic obstructive pulmonary disease, unspecified: Secondary | ICD-10-CM | POA: Diagnosis not present

## 2021-07-28 DIAGNOSIS — F5109 Other insomnia not due to a substance or known physiological condition: Secondary | ICD-10-CM | POA: Diagnosis not present

## 2021-08-19 DIAGNOSIS — H1013 Acute atopic conjunctivitis, bilateral: Secondary | ICD-10-CM | POA: Diagnosis not present

## 2021-08-19 DIAGNOSIS — L308 Other specified dermatitis: Secondary | ICD-10-CM | POA: Diagnosis not present

## 2021-08-27 DIAGNOSIS — E559 Vitamin D deficiency, unspecified: Secondary | ICD-10-CM | POA: Diagnosis not present

## 2021-08-27 DIAGNOSIS — J449 Chronic obstructive pulmonary disease, unspecified: Secondary | ICD-10-CM | POA: Diagnosis not present

## 2021-08-27 DIAGNOSIS — R251 Tremor, unspecified: Secondary | ICD-10-CM | POA: Diagnosis not present

## 2021-08-27 DIAGNOSIS — F5109 Other insomnia not due to a substance or known physiological condition: Secondary | ICD-10-CM | POA: Diagnosis not present

## 2021-08-27 DIAGNOSIS — R413 Other amnesia: Secondary | ICD-10-CM | POA: Diagnosis not present

## 2021-08-27 DIAGNOSIS — E1165 Type 2 diabetes mellitus with hyperglycemia: Secondary | ICD-10-CM | POA: Diagnosis not present

## 2021-08-27 DIAGNOSIS — E1342 Other specified diabetes mellitus with diabetic polyneuropathy: Secondary | ICD-10-CM | POA: Diagnosis not present

## 2021-08-27 DIAGNOSIS — M810 Age-related osteoporosis without current pathological fracture: Secondary | ICD-10-CM | POA: Diagnosis not present

## 2021-09-12 ENCOUNTER — Other Ambulatory Visit: Payer: Self-pay | Admitting: Internal Medicine

## 2021-09-12 DIAGNOSIS — Z1231 Encounter for screening mammogram for malignant neoplasm of breast: Secondary | ICD-10-CM

## 2021-10-08 ENCOUNTER — Encounter: Payer: Self-pay | Admitting: Neurology

## 2021-10-08 ENCOUNTER — Ambulatory Visit: Payer: Medicare Other | Admitting: Neurology

## 2021-10-08 VITALS — BP 136/68 | HR 62 | Ht 65.0 in | Wt 154.5 lb

## 2021-10-08 DIAGNOSIS — G250 Essential tremor: Secondary | ICD-10-CM | POA: Diagnosis not present

## 2021-10-08 DIAGNOSIS — F09 Unspecified mental disorder due to known physiological condition: Secondary | ICD-10-CM

## 2021-10-08 MED ORDER — MEMANTINE HCL 10 MG PO TABS: 10.0000 mg | ORAL_TABLET | Freq: Two times a day (BID) | ORAL | 3 refills | Status: DC

## 2021-10-08 MED ORDER — PRIMIDONE 50 MG PO TABS: 100.0000 mg | ORAL_TABLET | Freq: Two times a day (BID) | ORAL | 4 refills | Status: DC

## 2021-10-08 MED ORDER — DONEPEZIL HCL 5 MG PO TABS: 5.0000 mg | ORAL_TABLET | Freq: Every day | ORAL | 3 refills | Status: DC

## 2021-10-08 NOTE — Progress Notes (Signed)
HISTORY OF PRESENT ILLNESS  Rebecca Flores, cuffie been followed here since June 2009 for tremor felt to represent essential tremor, and previously followed by Dr. Doy Mince. Tremor also involves the head and to a lesser extent the hands. She has been treated with primidone with improvement. She is independent in all activities of daily living and she drives a car without problems.Her father suffered Parkinson's disease, mother died of Alzheimer's disease, there was no tremors in her siblings, she is now taking primidone 50 mg three times daily at 830, 1230 and 7:30 pm.She denies significant side effects from the medications.     UPDATE September 05 2014:She was diagnosed with diabetes around 2014, she now complains of bilateral toes, feet paresthesia, burning discomfort, difficulty falling to sleep because of her feet pain,  She has tried gabapentin without helping her symptoms, she denies gait difficulty  UPDATE August 6th 2019: She complains of depression, chronic insomnia, difficulty sleeping for 2 months. She cries over everything, she lives by herself,    In addition, she complains of 2 years history of bowel and bladder incontinence, gradually getting worse, to the point she is afraid to go out, went to take a long walk, often comes back with incontinence, she has mild bilateral feet paresthesia, unsteady gait   She continue to take primidone 50 mg 3 times for her tremor, which has helped her symptoms   UPDATE Nov 6th 2019: She is accompanied by her daughter at today's clinical visit. Essential tremor: primidone 50mg  tid does help her symptoms, Depression: celexa 20mg  every day was helping her symptoms, but she recently began to take it every other day, daughter noticed worsening anxiety.   Peripheral neuropathy: She continue have bilateral feet paresthesia, numbness tingling, urge to move, mostly at night, sometimes she sleeps with her sock on, she was taking nortriptyline 10 mg, 2 tablets every  night which help her sleep better.,  Her mother suffered dementia, she was noted to have gradual worsening memory loss, today's [place] examination was 20 out of 30   UPDATE October 04 2018: She is accompanied by her daughter-in-law at today's clinical visit, Essential tremor, she is taking primidone 50 mg 3 times a day, worst time is in the morning time," shake like a leaf", she did notice mild improvement after taking medication,   She also complains of worsening memory loss, she has been taking nortriptyline 50 mg at bedtime to help her sleep, and bilateral feet paresthesia  UPDATE September 20 2019: She continues to be bothered by her slow worsening essential tremor, involving upper extremity, and also has frequent head titubation, tried propanolol, could not tolerate it, did used to primidone, went back on it, used to take 50 mg 2 tablets in the morning, 1 tablet at night, when trying to take 3 times a day, she felt the morning dose was not enough, sometimes she becomes so shaky, also with associated anxiety, occasionally hypoglycemia episode  She still lives by herself, still driving, slow worsening memory loss, does not want to take [place]  Update June 05, 2020 SS: Here today with her daughter and daughter in law, remains on primidone 100 mg twice daily. Lives alone, does everything on her own, drives well.  Claims not sleeping well, has been out of melatonin.  Stopped taking Namenda last year, did not start Aricept due to fear of diarrhea.  Continues with anxiety, certain things get her very upset. Has fallen victim to several spam calls, lost money, daughters are changing  her phone #.  Tremor worsens over time involving both upper extremities, head and neck tremor.   Update April 08, 2021 SS: Here today with daughter, Steward Drone. Lives alone, does this well, she drives, she has a boyfriend, he stresses her out, worries about his aging health. With any anxiety or stress, she'll start crying, have  panic attack. People try to call and scam her, worries about this. Is a people pleasing person. Tremor continues, head, jaw, both hands, not sure if taking Primidone 50 mg, 2 tablets twice daily. I was able to see PCP notes, added Zoloft, Trazodone, Wellbutrin. She stopped Celexa, but isn't taking Zoloft. On Aricept 5 mg, Namenda 10 mg BID. MMSE 22/30.  Update October 08, 2021 SS: [name]. Here with daughter in law, [name]. Still has boyfriend. Lives alone, drives, kids are close by. Has trouble keeping up with medications, daughter helps. On Aricept 5 mg daily, Namenda 10 mg twice daily. No major changes. Even with calendar will ask about reminders. Is very careful not to answer phone to prevent scans. Tremors are ok, primidone really helps. No falls. Looks good today.   REVIEW OF SYSTEMS: Out of a complete 14 system review of symptoms, the patient complains only of the following symptoms, and all other reviewed systems are negative.  See HPI  ALLERGIES: Allergies  Allergen Reactions   Nsaids     Severe facial swelling/rash    Other Rash    Seldane   Atenolol     Hair loss    Codeine Nausea Only    Nausea    Lyrica [Pregabalin]     Constipation, swelling   Primidone     Emotional lability    Simvastatin     Leg cramps   Sulfa Antibiotics     Pruritis      HOME MEDICATIONS: Outpatient Medications Prior to Visit  Medication Sig Dispense Refill   aspirin EC 81 MG tablet Take 81 mg by mouth 3 (three) times a week. Monday, Wednesday, Friday.     atorvastatin (LIPITOR) 10 MG tablet Take 10 mg by mouth 3 (three) times a week.     BIOTIN FORTE PO Take by mouth daily.     buPROPion (WELLBUTRIN XL) 150 MG 24 hr tablet Take 150 mg by mouth daily.     donepezil (ARICEPT) 5 MG tablet Take 5 mg by mouth at bedtime.     fenofibrate (TRICOR) 145 MG tablet Take 145 mg by mouth daily.     glimepiride (AMARYL) 2 MG tablet 2 mg daily.     lisinopril (PRINIVIL,ZESTRIL) 5 MG tablet Take 5 mg  by mouth every morning.      loratadine (CLARITIN) 10 MG tablet Take 10 mg by mouth daily as needed for allergies (congestion).      MELATONIN PO Take 1 tablet by mouth at bedtime as needed.     memantine (NAMENDA) 10 MG tablet Take 1 tablet (10 mg total) by mouth 2 (two) times daily. 180 tablet 0   primidone (MYSOLINE) 50 MG tablet TAKE 2 TABLETS (100 MG TOTAL) BY MOUTH IN THE MORNING AND AT BEDTIME. INDICATION: ESSENTIAL TREMORS 360 tablet 4   sertraline (ZOLOFT) 25 MG tablet Take 25 mg by mouth daily.     traZODone (DESYREL) 50 MG tablet Take 50-100 mg by mouth at bedtime as needed.     TRESIBA FLEXTOUCH 100 UNIT/ML SOPN FlexTouch Pen INJECT 14 UNITS SUBQ DAILY  6   valsartan (DIOVAN) 80 MG tablet 1 tablet  VITAMIN E PO Take 1 tablet by mouth daily.     Cholecalciferol (VITAMIN D3 PO) Take 1 tablet by mouth daily.     No facility-administered medications prior to visit.    PAST MEDICAL HISTORY: Past Medical History:  Diagnosis Date   Bradycardia    Colon polyp    Diabetes mellitus    Family history of anesthesia complication    sister hard to wake up   GERD (gastroesophageal reflux disease)    Hypercholesteremia    Hypertension    Kidney stone    Movement disorder    Osteoporosis    Peripheral neuropathy    Seasonal allergies    Sepsis ([place])    Tobacco use    Tremors of nervous system     PAST SURGICAL HISTORY: Past Surgical History:  Procedure Laterality Date   ABDOMINAL HYSTERECTOMY     APPENDECTOMY     bladder tacking     CHOLECYSTECTOMY  1998   COLONOSCOPY     HERNIA REPAIR  05/2013   INGUINAL HERNIA REPAIR Right 06/27/2013   Procedure: RIGHT INGUINAL HERNIA REPAIR WITH MESH;  Surgeon: Earnstine Regal, MD;  Location: [place];  Service: General;  Laterality: Right;   INSERTION OF MESH Right 06/27/2013   Procedure: INSERTION OF MESH;  Surgeon: Earnstine Regal, MD;  Location: [place];  Service: General;  Laterality: Right;   MOUTH  SURGERY     TONSILLECTOMY      FAMILY HISTORY: Family History  Problem Relation Age of Onset   Dementia Mother    Diabetes Mellitus II Father    Prostate cancer Father    Dementia Sister    Breast cancer Sister    Breast cancer Daughter     SOCIAL HISTORY: Social History   Socioeconomic History   Marital status: Widowed    Spouse name: Not on file   Number of children: 2   Years of education: 9TH   Highest education level: Not on file  Occupational History    Employer: RETIRED  Tobacco Use   Smoking status: Former    Types: Cigarettes    Quit date: 04/23/2009    Years since quitting: 12.4   Smokeless tobacco: Never  Substance and Sexual Activity   Alcohol use: No    Alcohol/week: 1.0 standard drink of alcohol    Types: 1 Glasses of wine per week   Drug use: No   Sexual activity: Never  Other Topics Concern   Not on file  Social History Narrative   Patient is widowed with 2 children   09/07/16 Lives alone   Patient has a 9th grade education   Patient is right handed   Patient does not drink caffeine   Social Determinants of Radio broadcast assistant Strain: Not on file  Food Insecurity: Not on file  Transportation Needs: Not on file  Physical Activity: Not on file  Stress: Not on file  Social Connections: Not on file  Intimate Partner Violence: Not on file   PHYSICAL EXAM  Vitals:   10/08/21 0917  BP: 136/68  Pulse: 62  Weight: 154 lb 8 oz (70.1 kg)  Height: 5\' 5"  (1.651 m)     Body mass index is 25.71 kg/m.  Generalized: Well developed, in no acute distress     04/08/2021    2:17 PM 06/05/2020    1:00 PM 09/20/2019    2:28 PM  MMSE - Mini Mental State Exam  Orientation to  time 4 3 4   Orientation to Place 5 5 4   Registration 3 3 3   Attention/ Calculation 0 0 0  Recall 1 1 0  Language- name 2 objects 2 2 2   Language- repeat 1 1 1   Language- follow 3 step command 3 3 3   Language- read & follow direction 1 1 1   Write a sentence 1 1 1    Copy design 1 1 1   Total score 22 21 20    Physical Exam  General: The patient is alert and cooperative at the time of the examination.  She provides most all of her history.  Appears less anxious today.  Skin: No significant peripheral edema is noted.  Neurologic Exam  Mental status: The patient is alert, fairly good historian . The patient has apparent normal recent and remote memory, with an apparently normal attention span and concentration ability.  Cranial nerves: Facial symmetry is present. Speech is normal, no aphasia or dysarthria is noted. Extraocular movements are full. Visual fields are full. Head tremor, jaw tremor noted, mild-moderate.  Motor: The patient has good strength in all 4 extremities.   Sensory examination: Soft touch sensation is symmetric on the face, arms, and legs.  Coordination: The patient has good finger-nose-finger and heel-to-shin bilaterally.  Mild to moderate tremor with finger-nose-finger, greater on the left than the right. Mild to moderate tremor with handwriting sample, spiral draw.   Gait and station: The patient has a normal gait.  Is independent  Reflexes: Deep tendon reflexes are symmetric.  DIAGNOSTIC DATA (LABS, IMAGING, TESTING) - I reviewed patient records, labs, notes, testing and imaging myself where available.  Lab Results  Component Value Date   WBC 8.3 06/05/2020   HGB 12.0 06/05/2020   HCT 35.4 06/05/2020   MCV 88 06/05/2020   PLT 220 06/05/2020      Component Value Date/Time   NA 142 06/05/2020 1404   K 4.1 06/05/2020 1404   CL 104 06/05/2020 1404   CO2 20 06/05/2020 1404   GLUCOSE 174 (H) 06/05/2020 1404   GLUCOSE 253 (H) 06/13/2014 0848   BUN 29 (H) 06/05/2020 1404   CREATININE 1.02 (H) 06/05/2020 1404   CALCIUM 9.6 06/05/2020 1404   PROT 6.6 06/05/2020 1404   ALBUMIN 4.4 06/05/2020 1404   AST 20 06/05/2020 1404   ALT 12 06/05/2020 1404   ALKPHOS 64 06/05/2020 1404   BILITOT 0.2 06/05/2020 1404   GFRNONAA 69  12/29/2017 1045   GFRAA 79 12/29/2017 1045   Lab Results  Component Value Date   CHOL 147 04/25/2011   HDL 33 (L) 04/25/2011   LDLCALC 53 04/25/2011   TRIG 307 (H) 04/25/2011   CHOLHDL 4.5 04/25/2011   Lab Results  Component Value Date   HGBA1C 7.2 (H) 11/28/2013   Lab Results  Component Value Date   VITAMINB12 340 12/29/2017   Lab Results  Component Value Date   TSH 2.390 12/29/2017   ASSESSMENT AND PLAN 81 y.o. year old female   1.  Essential tremor -Stable, primidone helps -Check primidone blood level for baseline -Continue primidone 50 mg, 2 tablets twice a day -Unable to tolerate beta-blocker due to side effect  2.  Mild cognitive impairment -Slow worsening memory loss, anxiety contributes, [facility]OCA 16/30 today, lives alone with family close by, is fairly independent -Continue Aricept, on 5 mg daily, Namenda 10 mg twice daily, does not wish to increase Aricept, some concern for diarrhea -Most consistent with central nervous system degenerative disorder -Family history of dementia -  Laboratory evaluation in November 2019 showed no treatable etiology, normal B12, TS -CT head without contrast in 2013 no significant abnormality -Advised family to closely monitor safety of driving  3.  Depression, anxiety -Under better control, on Wellbutrin, Zoloft, trazodone  Margie Ege, Edrick Oh, DNP  [facility] [address], [address] [place], [place] [address] [phone]

## 2021-10-08 NOTE — Patient Instructions (Signed)
Check primidone level  Meds ordered this encounter  Medications   primidone (MYSOLINE) 50 MG tablet    Sig: Take 2 tablets (100 mg total) by mouth in the morning and at bedtime. Indication: essential tremors    Dispense:  360 tablet    Refill:  4   memantine (NAMENDA) 10 MG tablet    Sig: Take 1 tablet (10 mg total) by mouth 2 (two) times daily.    Dispense:  180 tablet    Refill:  3   donepezil (ARICEPT) 5 MG tablet    Sig: Take 1 tablet (5 mg total) by mouth at bedtime.    Dispense:  90 tablet    Refill:  3   Closely monitor driving

## 2021-10-09 LAB — PRIMIDONE, SERUM
Phenobarbital, Serum: 5 ug/mL — ABNORMAL LOW (ref 15–40)
Primidone Lvl: 5.5 ug/mL (ref 5.0–12.0)

## 2021-10-14 ENCOUNTER — Telehealth: Payer: Self-pay

## 2021-10-14 NOTE — Telephone Encounter (Signed)
See note in epic

## 2021-10-14 NOTE — Telephone Encounter (Signed)
-----   Message from Glean Salvo, NP sent at 10/14/2021  5:47 AM EDT ----- Sent my chart note

## 2021-10-17 ENCOUNTER — Ambulatory Visit: Payer: Self-pay

## 2021-10-17 NOTE — Patient Outreach (Signed)
  Care Coordination   10/17/2021 Name: Rebecca Flores MRN: [id number] DOB: 1940/03/26   Care Coordination Outreach Attempts:  An unsuccessful telephone outreach was attempted today to offer the patient information about available care coordination services as a benefit of their health plan.   Follow Up Plan:  Additional outreach attempts will be made to offer the patient care coordination information and services.   Encounter Outcome:  No Answer  Care Coordination Interventions Activated:  No   Care Coordination Interventions:  No, not indicated    [facility] Care Management [phone]

## 2021-10-29 DIAGNOSIS — F5109 Other insomnia not due to a substance or known physiological condition: Secondary | ICD-10-CM | POA: Diagnosis not present

## 2021-10-29 DIAGNOSIS — J449 Chronic obstructive pulmonary disease, unspecified: Secondary | ICD-10-CM | POA: Diagnosis not present

## 2021-10-29 DIAGNOSIS — E1165 Type 2 diabetes mellitus with hyperglycemia: Secondary | ICD-10-CM | POA: Diagnosis not present

## 2021-10-29 DIAGNOSIS — Z23 Encounter for immunization: Secondary | ICD-10-CM | POA: Diagnosis not present

## 2021-10-29 DIAGNOSIS — R413 Other amnesia: Secondary | ICD-10-CM | POA: Diagnosis not present

## 2021-10-29 DIAGNOSIS — I10 Essential (primary) hypertension: Secondary | ICD-10-CM | POA: Diagnosis not present

## 2021-11-03 ENCOUNTER — Ambulatory Visit: Payer: Medicare Other

## 2021-11-23 ENCOUNTER — Inpatient Hospital Stay (HOSPITAL_COMMUNITY)
Admission: EM | Admit: 2021-11-23 | Discharge: 2021-11-26 | DRG: 177 | Disposition: A | Discharge status: Home / Self Care | Payer: Medicare Other | Attending: Internal Medicine | Admitting: Internal Medicine

## 2021-11-23 ENCOUNTER — Encounter (HOSPITAL_COMMUNITY): Payer: Self-pay | Admitting: Emergency Medicine

## 2021-11-23 DIAGNOSIS — E7800 Pure hypercholesterolemia, unspecified: Secondary | ICD-10-CM | POA: Diagnosis present

## 2021-11-23 DIAGNOSIS — R531 Weakness: Secondary | ICD-10-CM | POA: Diagnosis not present

## 2021-11-23 DIAGNOSIS — Z7984 Long term (current) use of oral hypoglycemic drugs: Secondary | ICD-10-CM

## 2021-11-23 DIAGNOSIS — R918 Other nonspecific abnormal finding of lung field: Secondary | ICD-10-CM | POA: Diagnosis not present

## 2021-11-23 DIAGNOSIS — R001 Bradycardia, unspecified: Secondary | ICD-10-CM | POA: Diagnosis not present

## 2021-11-23 DIAGNOSIS — A0839 Other viral enteritis: Secondary | ICD-10-CM | POA: Diagnosis present

## 2021-11-23 DIAGNOSIS — G250 Essential tremor: Secondary | ICD-10-CM | POA: Diagnosis not present

## 2021-11-23 DIAGNOSIS — I119 Hypertensive heart disease without heart failure: Secondary | ICD-10-CM | POA: Diagnosis present

## 2021-11-23 DIAGNOSIS — R911 Solitary pulmonary nodule: Secondary | ICD-10-CM | POA: Diagnosis not present

## 2021-11-23 DIAGNOSIS — Z882 Allergy status to sulfonamides status: Secondary | ICD-10-CM

## 2021-11-23 DIAGNOSIS — E860 Dehydration: Secondary | ICD-10-CM | POA: Diagnosis present

## 2021-11-23 DIAGNOSIS — J1282 Pneumonia due to coronavirus disease 2019: Secondary | ICD-10-CM | POA: Diagnosis not present

## 2021-11-23 DIAGNOSIS — E1142 Type 2 diabetes mellitus with diabetic polyneuropathy: Secondary | ICD-10-CM | POA: Diagnosis not present

## 2021-11-23 DIAGNOSIS — E871 Hypo-osmolality and hyponatremia: Secondary | ICD-10-CM | POA: Diagnosis present

## 2021-11-23 DIAGNOSIS — N390 Urinary tract infection, site not specified: Secondary | ICD-10-CM | POA: Diagnosis not present

## 2021-11-23 DIAGNOSIS — Z79899 Other long term (current) drug therapy: Secondary | ICD-10-CM | POA: Diagnosis not present

## 2021-11-23 DIAGNOSIS — R55 Syncope and collapse: Secondary | ICD-10-CM

## 2021-11-23 DIAGNOSIS — R1110 Vomiting, unspecified: Secondary | ICD-10-CM | POA: Diagnosis not present

## 2021-11-23 DIAGNOSIS — B961 Klebsiella pneumoniae [K. pneumoniae] as the cause of diseases classified elsewhere: Secondary | ICD-10-CM | POA: Diagnosis present

## 2021-11-23 DIAGNOSIS — Z7982 Long term (current) use of aspirin: Secondary | ICD-10-CM | POA: Diagnosis not present

## 2021-11-23 DIAGNOSIS — Z743 Need for continuous supervision: Secondary | ICD-10-CM | POA: Diagnosis not present

## 2021-11-23 DIAGNOSIS — Z885 Allergy status to narcotic agent status: Secondary | ICD-10-CM

## 2021-11-23 DIAGNOSIS — N179 Acute kidney failure, unspecified: Secondary | ICD-10-CM | POA: Diagnosis present

## 2021-11-23 DIAGNOSIS — U071 COVID-19: Principal | ICD-10-CM

## 2021-11-23 DIAGNOSIS — F32A Depression, unspecified: Secondary | ICD-10-CM | POA: Diagnosis not present

## 2021-11-23 DIAGNOSIS — M810 Age-related osteoporosis without current pathological fracture: Secondary | ICD-10-CM | POA: Diagnosis present

## 2021-11-23 DIAGNOSIS — Z794 Long term (current) use of insulin: Secondary | ICD-10-CM | POA: Diagnosis not present

## 2021-11-23 DIAGNOSIS — Z9049 Acquired absence of other specified parts of digestive tract: Secondary | ICD-10-CM

## 2021-11-23 DIAGNOSIS — F0394 Unspecified dementia, unspecified severity, with anxiety: Secondary | ICD-10-CM | POA: Diagnosis present

## 2021-11-23 DIAGNOSIS — I700 Atherosclerosis of aorta: Secondary | ICD-10-CM | POA: Diagnosis not present

## 2021-11-23 DIAGNOSIS — Z7983 Long term (current) use of bisphosphonates: Secondary | ICD-10-CM

## 2021-11-23 DIAGNOSIS — E872 Acidosis, unspecified: Secondary | ICD-10-CM

## 2021-11-23 DIAGNOSIS — R197 Diarrhea, unspecified: Secondary | ICD-10-CM | POA: Diagnosis not present

## 2021-11-23 DIAGNOSIS — I517 Cardiomegaly: Secondary | ICD-10-CM | POA: Diagnosis not present

## 2021-11-23 DIAGNOSIS — R6889 Other general symptoms and signs: Secondary | ICD-10-CM | POA: Diagnosis not present

## 2021-11-23 DIAGNOSIS — E119 Type 2 diabetes mellitus without complications: Secondary | ICD-10-CM

## 2021-11-23 DIAGNOSIS — Z833 Family history of diabetes mellitus: Secondary | ICD-10-CM

## 2021-11-23 DIAGNOSIS — K219 Gastro-esophageal reflux disease without esophagitis: Secondary | ICD-10-CM | POA: Diagnosis not present

## 2021-11-23 DIAGNOSIS — Z888 Allergy status to other drugs, medicaments and biological substances status: Secondary | ICD-10-CM

## 2021-11-23 DIAGNOSIS — Z8042 Family history of malignant neoplasm of prostate: Secondary | ICD-10-CM

## 2021-11-23 DIAGNOSIS — Z803 Family history of malignant neoplasm of breast: Secondary | ICD-10-CM

## 2021-11-23 DIAGNOSIS — R0602 Shortness of breath: Secondary | ICD-10-CM | POA: Diagnosis not present

## 2021-11-23 DIAGNOSIS — Z886 Allergy status to analgesic agent status: Secondary | ICD-10-CM

## 2021-11-23 DIAGNOSIS — R112 Nausea with vomiting, unspecified: Secondary | ICD-10-CM | POA: Diagnosis not present

## 2021-11-23 LAB — BASIC METABOLIC PANEL
Anion gap: 12 (ref 5–15)
BUN: 60 mg/dL — ABNORMAL HIGH (ref 8–23)
CO2: 13 mmol/L — ABNORMAL LOW (ref 22–32)
Calcium: 8.9 mg/dL (ref 8.9–10.3)
Chloride: 101 mmol/L (ref 98–111)
Creatinine, Ser: 2.11 mg/dL — ABNORMAL HIGH (ref 0.44–1.00)
GFR, Estimated: 23 mL/min — ABNORMAL LOW (ref >60)
Glucose, Bld: 164 mg/dL — ABNORMAL HIGH (ref 70–99)
Potassium: 4.6 mmol/L (ref 3.5–5.1)
Sodium: 126 mmol/L — ABNORMAL LOW (ref 135–145)

## 2021-11-23 LAB — CBC
HCT: 36 % (ref 36.0–46.0)
Hemoglobin: 12 g/dL (ref 12.0–15.0)
MCH: 29.3 pg (ref 26.0–34.0)
MCHC: 33.3 g/dL (ref 30.0–36.0)
MCV: 87.8 fL (ref 80.0–100.0)
Platelets: 242 10*3/uL (ref 150–400)
RBC: 4.1 MIL/uL (ref 3.87–5.11)
RDW: 12.3 % (ref 11.5–15.5)
WBC: 10.3 10*3/uL (ref 4.0–10.5)
nRBC: 0 % (ref 0.0–0.2)

## 2021-11-23 LAB — CBG MONITORING, ED: Glucose-Capillary: 164 mg/dL — ABNORMAL HIGH (ref 70–99)

## 2021-11-23 NOTE — ED Triage Notes (Signed)
Patient reports generalized weakness/fatigue with diarrhea and nausea this week , tested negative for Covid today .

## 2021-11-23 NOTE — ED Provider Triage Note (Signed)
Emergency Medicine Provider Triage Evaluation Note  Maurine Mowbray , a 81 y.o. female  was evaluated in triage.  Pt complains of generalized weakness.  Patient tested positive for COVID approximately 1 week ago.  Has been dealing with symptoms during the week including nausea and diarrhea.  Patient's family stated she seemed weaker than usual today and called 911 for evaluation.  Patient denies shortness of breath at this time.  Patient does have a tremor but states that she has a history of centralized tremors.  Patient denies vomiting.  Review of Systems  Positive: As above Negative: As above  Physical Exam  BP 131/61 (BP Location: Right Arm)   Pulse (!) 53   Temp 98.2 F (36.8 C) (Oral)   Resp 16   SpO2 97%  Gen:   Awake, no distress   Resp:  Normal effort  MSK:   Moves extremities without difficulty  Other:    Medical Decision Making  Medically screening exam initiated at 10:06 PM.  Appropriate orders placed.  Delphina Apple Topor was informed that the remainder of the evaluation will be completed by another provider, this initial triage assessment does not replace that evaluation, and the importance of remaining in the ED until their evaluation is complete.     Ronny Bacon 11/23/21 2207

## 2021-11-24 ENCOUNTER — Observation Stay (HOSPITAL_COMMUNITY): Payer: Medicare Other

## 2021-11-24 ENCOUNTER — Emergency Department (HOSPITAL_COMMUNITY): Payer: Medicare Other

## 2021-11-24 DIAGNOSIS — E872 Acidosis, unspecified: Secondary | ICD-10-CM

## 2021-11-24 DIAGNOSIS — Z7984 Long term (current) use of oral hypoglycemic drugs: Secondary | ICD-10-CM | POA: Diagnosis not present

## 2021-11-24 DIAGNOSIS — K219 Gastro-esophageal reflux disease without esophagitis: Secondary | ICD-10-CM | POA: Diagnosis present

## 2021-11-24 DIAGNOSIS — F0394 Unspecified dementia, unspecified severity, with anxiety: Secondary | ICD-10-CM | POA: Diagnosis present

## 2021-11-24 DIAGNOSIS — M810 Age-related osteoporosis without current pathological fracture: Secondary | ICD-10-CM | POA: Diagnosis present

## 2021-11-24 DIAGNOSIS — N179 Acute kidney failure, unspecified: Secondary | ICD-10-CM | POA: Diagnosis present

## 2021-11-24 DIAGNOSIS — U071 COVID-19: Secondary | ICD-10-CM

## 2021-11-24 DIAGNOSIS — I119 Hypertensive heart disease without heart failure: Secondary | ICD-10-CM | POA: Diagnosis present

## 2021-11-24 DIAGNOSIS — Z7982 Long term (current) use of aspirin: Secondary | ICD-10-CM | POA: Diagnosis not present

## 2021-11-24 DIAGNOSIS — E1142 Type 2 diabetes mellitus with diabetic polyneuropathy: Secondary | ICD-10-CM | POA: Diagnosis not present

## 2021-11-24 DIAGNOSIS — I700 Atherosclerosis of aorta: Secondary | ICD-10-CM | POA: Diagnosis not present

## 2021-11-24 DIAGNOSIS — R911 Solitary pulmonary nodule: Secondary | ICD-10-CM | POA: Diagnosis present

## 2021-11-24 DIAGNOSIS — R001 Bradycardia, unspecified: Secondary | ICD-10-CM

## 2021-11-24 DIAGNOSIS — J1282 Pneumonia due to coronavirus disease 2019: Secondary | ICD-10-CM | POA: Diagnosis present

## 2021-11-24 DIAGNOSIS — E119 Type 2 diabetes mellitus without complications: Secondary | ICD-10-CM | POA: Diagnosis present

## 2021-11-24 DIAGNOSIS — N390 Urinary tract infection, site not specified: Secondary | ICD-10-CM | POA: Diagnosis present

## 2021-11-24 DIAGNOSIS — R918 Other nonspecific abnormal finding of lung field: Secondary | ICD-10-CM | POA: Diagnosis not present

## 2021-11-24 DIAGNOSIS — E860 Dehydration: Secondary | ICD-10-CM | POA: Diagnosis present

## 2021-11-24 DIAGNOSIS — I517 Cardiomegaly: Secondary | ICD-10-CM | POA: Diagnosis not present

## 2021-11-24 DIAGNOSIS — E871 Hypo-osmolality and hyponatremia: Secondary | ICD-10-CM | POA: Diagnosis present

## 2021-11-24 DIAGNOSIS — R1110 Vomiting, unspecified: Secondary | ICD-10-CM

## 2021-11-24 DIAGNOSIS — B961 Klebsiella pneumoniae [K. pneumoniae] as the cause of diseases classified elsewhere: Secondary | ICD-10-CM | POA: Diagnosis present

## 2021-11-24 DIAGNOSIS — F32A Depression, unspecified: Secondary | ICD-10-CM | POA: Diagnosis present

## 2021-11-24 DIAGNOSIS — R55 Syncope and collapse: Secondary | ICD-10-CM | POA: Diagnosis present

## 2021-11-24 DIAGNOSIS — Z79899 Other long term (current) drug therapy: Secondary | ICD-10-CM | POA: Diagnosis not present

## 2021-11-24 DIAGNOSIS — Z794 Long term (current) use of insulin: Secondary | ICD-10-CM | POA: Diagnosis not present

## 2021-11-24 DIAGNOSIS — A0839 Other viral enteritis: Secondary | ICD-10-CM | POA: Diagnosis present

## 2021-11-24 DIAGNOSIS — E7800 Pure hypercholesterolemia, unspecified: Secondary | ICD-10-CM | POA: Diagnosis present

## 2021-11-24 DIAGNOSIS — G250 Essential tremor: Secondary | ICD-10-CM | POA: Diagnosis present

## 2021-11-24 LAB — HEMOGLOBIN A1C
Hgb A1c MFr Bld: 6.5 % — ABNORMAL HIGH (ref 4.8–5.6)
Mean Plasma Glucose: 139.85 mg/dL

## 2021-11-24 LAB — BASIC METABOLIC PANEL
Anion gap: 9 (ref 5–15)
BUN: 51 mg/dL — ABNORMAL HIGH (ref 8–23)
CO2: 14 mmol/L — ABNORMAL LOW (ref 22–32)
Calcium: 8.4 mg/dL — ABNORMAL LOW (ref 8.9–10.3)
Chloride: 108 mmol/L (ref 98–111)
Creatinine, Ser: 1.56 mg/dL — ABNORMAL HIGH (ref 0.44–1.00)
GFR, Estimated: 33 mL/min — ABNORMAL LOW (ref >60)
Glucose, Bld: 106 mg/dL — ABNORMAL HIGH (ref 70–99)
Potassium: 4.7 mmol/L (ref 3.5–5.1)
Sodium: 131 mmol/L — ABNORMAL LOW (ref 135–145)

## 2021-11-24 LAB — HEPATIC FUNCTION PANEL
ALT: 12 U/L (ref 0–44)
AST: 19 U/L (ref 15–41)
Albumin: 3 g/dL — ABNORMAL LOW (ref 3.5–5.0)
Alkaline Phosphatase: 91 U/L (ref 38–126)
Bilirubin, Direct: 0.1 mg/dL (ref 0.0–0.2)
Indirect Bilirubin: 0.3 mg/dL (ref 0.3–0.9)
Total Bilirubin: 0.4 mg/dL (ref 0.3–1.2)
Total Protein: 6 g/dL — ABNORMAL LOW (ref 6.5–8.1)

## 2021-11-24 LAB — CBG MONITORING, ED
Glucose-Capillary: 166 mg/dL — ABNORMAL HIGH (ref 70–99)
Glucose-Capillary: 104 mg/dL — ABNORMAL HIGH (ref 70–99)
Glucose-Capillary: 65 mg/dL — ABNORMAL LOW (ref 70–99)
Glucose-Capillary: 164 mg/dL — ABNORMAL HIGH (ref 70–99)
Glucose-Capillary: 105 mg/dL — ABNORMAL HIGH (ref 70–99)
Glucose-Capillary: 155 mg/dL — ABNORMAL HIGH (ref 70–99)

## 2021-11-24 LAB — URINALYSIS, ROUTINE W REFLEX MICROSCOPIC
Bilirubin Urine: NEGATIVE
Glucose, UA: NEGATIVE mg/dL
Hgb urine dipstick: NEGATIVE
Ketones, ur: NEGATIVE mg/dL
Nitrite: POSITIVE — AB
Protein, ur: NEGATIVE mg/dL
Specific Gravity, Urine: 1.008 (ref 1.005–1.030)
pH: 5 (ref 5.0–8.0)

## 2021-11-24 LAB — I-STAT VENOUS BLOOD GAS, ED
Acid-base deficit: 11 mmol/L — ABNORMAL HIGH (ref 0.0–2.0)
Bicarbonate: 13.5 mmol/L — ABNORMAL LOW (ref 20.0–28.0)
Calcium, Ion: 1.11 mmol/L — ABNORMAL LOW (ref 1.15–1.40)
HCT: 30 % — ABNORMAL LOW (ref 36.0–46.0)
Hemoglobin: 10.2 g/dL — ABNORMAL LOW (ref 12.0–15.0)
O2 Saturation: 63 %
Potassium: 4.5 mmol/L (ref 3.5–5.1)
Sodium: 130 mmol/L — ABNORMAL LOW (ref 135–145)
TCO2: 14 mmol/L — ABNORMAL LOW (ref 22–32)
pCO2, Ven: 25.6 mmHg — ABNORMAL LOW (ref 44–60)
pH, Ven: 7.33 (ref 7.25–7.43)
pO2, Ven: 34 mmHg (ref 32–45)

## 2021-11-24 LAB — OSMOLALITY: Osmolality: 286 mOsm/kg (ref 275–295)

## 2021-11-24 LAB — LACTATE DEHYDROGENASE: LDH: 153 U/L (ref 98–192)

## 2021-11-24 LAB — ECHOCARDIOGRAM COMPLETE
Area-P 1/2: 7.16 cm2
S' Lateral: 2.95 cm

## 2021-11-24 LAB — D-DIMER, QUANTITATIVE: D-Dimer, Quant: 1.86 ug/mL-FEU — ABNORMAL HIGH (ref 0.00–0.50)

## 2021-11-24 LAB — FERRITIN: Ferritin: 220 ng/mL (ref 11–307)

## 2021-11-24 LAB — LIPASE, BLOOD: Lipase: 59 U/L — ABNORMAL HIGH (ref 11–51)

## 2021-11-24 LAB — C-REACTIVE PROTEIN: CRP: 2.7 mg/dL — ABNORMAL HIGH (ref <1.0)

## 2021-11-24 LAB — FIBRINOGEN: Fibrinogen: 591 mg/dL — ABNORMAL HIGH (ref 210–475)

## 2021-11-24 LAB — SARS CORONAVIRUS 2 BY RT PCR: SARS Coronavirus 2 by RT PCR: POSITIVE — AB

## 2021-11-24 MED ORDER — HEPARIN SODIUM (PORCINE) 5000 UNIT/ML IJ SOLN
5000.0000 [IU] | Freq: Three times a day (TID) | INTRAMUSCULAR | Status: DC
  Administered 2021-11-24 – 2021-11-26 (×7): 5000 [IU] via SUBCUTANEOUS
  Filled 2021-11-24 (×7): qty 1

## 2021-11-24 MED ORDER — SERTRALINE HCL 50 MG PO TABS
25.0000 mg | ORAL_TABLET | Freq: Every day | ORAL | Status: DC
  Administered 2021-11-24 – 2021-11-26 (×3): 25 mg via ORAL
  Filled 2021-11-24 (×3): qty 1

## 2021-11-24 MED ORDER — METHYLPREDNISOLONE SODIUM SUCC 40 MG IJ SOLR
40.0000 mg | Freq: Two times a day (BID) | INTRAMUSCULAR | Status: DC
  Administered 2021-11-24 – 2021-11-26 (×4): 40 mg via INTRAVENOUS
  Filled 2021-11-24 (×4): qty 1

## 2021-11-24 MED ORDER — ONDANSETRON HCL 4 MG/2ML IJ SOLN
4.0000 mg | Freq: Once | INTRAMUSCULAR | Status: AC
  Administered 2021-11-24: 4 mg via INTRAVENOUS
  Filled 2021-11-24: qty 2

## 2021-11-24 MED ORDER — ATORVASTATIN CALCIUM 10 MG PO TABS
10.0000 mg | ORAL_TABLET | ORAL | Status: DC
  Administered 2021-11-24 – 2021-11-26 (×2): 10 mg via ORAL
  Filled 2021-11-24 (×2): qty 1

## 2021-11-24 MED ORDER — SODIUM CHLORIDE 0.9 % IV SOLN
1.0000 g | INTRAVENOUS | Status: DC
  Administered 2021-11-24 – 2021-11-26 (×3): 1 g via INTRAVENOUS
  Filled 2021-11-24 (×3): qty 10

## 2021-11-24 MED ORDER — PRIMIDONE 50 MG PO TABS
100.0000 mg | ORAL_TABLET | Freq: Two times a day (BID) | ORAL | Status: DC
  Administered 2021-11-24 – 2021-11-26 (×5): 100 mg via ORAL
  Filled 2021-11-24 (×8): qty 2

## 2021-11-24 MED ORDER — BUPROPION HCL ER (XL) 150 MG PO TB24
150.0000 mg | ORAL_TABLET | Freq: Every day | ORAL | Status: DC
  Administered 2021-11-24 – 2021-11-26 (×3): 150 mg via ORAL
  Filled 2021-11-24 (×4): qty 1

## 2021-11-24 MED ORDER — ONDANSETRON HCL 4 MG/2ML IJ SOLN: 4.0000 mg | Freq: Four times a day (QID) | INTRAMUSCULAR | Status: DC | PRN

## 2021-11-24 MED ORDER — MEMANTINE HCL 10 MG PO TABS
10.0000 mg | ORAL_TABLET | Freq: Two times a day (BID) | ORAL | Status: DC
  Administered 2021-11-24 – 2021-11-26 (×5): 10 mg via ORAL
  Filled 2021-11-24 (×7): qty 1

## 2021-11-24 MED ORDER — SODIUM CHLORIDE 0.9 % IV SOLN: INTRAVENOUS | Status: DC

## 2021-11-24 MED ORDER — SODIUM CHLORIDE 0.9 % IV BOLUS
1000.0000 mL | Freq: Once | INTRAVENOUS | Status: AC
  Administered 2021-11-24: 1000 mL via INTRAVENOUS

## 2021-11-24 MED ORDER — INSULIN ASPART 100 UNIT/ML IJ SOLN
0.0000 [IU] | INTRAMUSCULAR | Status: DC
  Administered 2021-11-24: 2 [IU] via SUBCUTANEOUS
  Administered 2021-11-25: 3 [IU] via SUBCUTANEOUS
  Administered 2021-11-25 – 2021-11-26 (×2): 2 [IU] via SUBCUTANEOUS

## 2021-11-24 MED ORDER — LOPERAMIDE HCL 2 MG PO CAPS: 2.0000 mg | ORAL_CAPSULE | ORAL | Status: DC | PRN

## 2021-11-24 MED ORDER — LACTATED RINGERS IV SOLN: INTRAVENOUS | Status: DC

## 2021-11-24 MED ORDER — DONEPEZIL HCL 5 MG PO TABS
5.0000 mg | ORAL_TABLET | Freq: Every day | ORAL | Status: DC
  Filled 2021-11-24: qty 1

## 2021-11-24 NOTE — H&P (Signed)
History and Physical    Rebecca Flores ZDG:[id number] DOB: Oct 30, 1940 DOA: 11/23/2021  PCP: Marden Noble, MD  Patient coming from: Home  Chief Complaint: Generalized weakness  HPI: Rebecca Flores is a 81 y.o. female with medical history significant of mild cognitive impairment, bradycardia, type 2 diabetes, GERD, hypertension, hyperlipidemia, essential tremor, depression/anxiety presented to the ED with complaints of generalized weakness, nausea, vomiting, diarrhea, and near syncope in the setting of testing positive for COVID a week ago.  She did complete a course of molnupiravir.  In the ED, patient slightly bradycardic with heart rate in the 50s but remainder of vital signs stable.  Labs showing no leukocytosis or anemia, sodium 126, bicarb 13, anion gap 12, glucose 164, BUN 60, creatinine 2.1 (baseline around 1.0), COVID test pending.  UA with positive nitrite, moderate amount of leukocytes, and microscopy showing 21-50 WBCs and many bacteria.  Chest x-ray showing patchy airspace disease at the right lung base, possible atelectasis or infiltrate.  Also showing oval opacity in the infrahilar region on the right measuring 1.7 cm which is new from the previous exam and indeterminate.  Follow-up CT recommended for further evaluation.  In addition, showing cardiomegaly. Patient was given Zofran and 1 L normal saline bolus.  [facility] called to admit.  History provided by the patient and her son at bedside.  She was diagnosed with COVID infection a week ago and was treated with a 5-day course of molnupiravir.  She had mild cough but no shortness of breath.  She initially improved but then started having profuse watery, nonbloody diarrhea and nausea a few days ago.  Yesterday she started vomiting.  Her oral intake has been very poor this past week and having generalized weakness.  Yesterday she had near syncope but family was with her and she did not fall.  She did not lose consciousness.   Patient denies fevers, chest pain, or abdominal pain.  Review of Systems:  Review of Systems  All other systems reviewed and are negative.   Past Medical History:  Diagnosis Date   Bradycardia    Colon polyp    Diabetes mellitus    Family history of anesthesia complication    sister hard to wake up   GERD (gastroesophageal reflux disease)    Hypercholesteremia    Hypertension    Kidney stone    Movement disorder    Osteoporosis    Peripheral neuropathy    Seasonal allergies    Sepsis (HCC)    Tobacco use    Tremors of nervous system     Past Surgical History:  Procedure Laterality Date   ABDOMINAL HYSTERECTOMY     APPENDECTOMY     bladder tacking     CHOLECYSTECTOMY  1998   COLONOSCOPY     HERNIA REPAIR  05/2013   INGUINAL HERNIA REPAIR Right 06/27/2013   Procedure: RIGHT INGUINAL HERNIA REPAIR WITH MESH;  Surgeon: Velora Heckler, MD;  Location: [facility];  Service: General;  Laterality: Right;   INSERTION OF MESH Right 06/27/2013   Procedure: INSERTION OF MESH;  Surgeon: Velora Heckler, MD;  Location: [facility];  Service: General;  Laterality: Right;   MOUTH SURGERY     TONSILLECTOMY       reports that she quit smoking about 12 years ago. Her smoking use included cigarettes. She has never used smokeless tobacco. She reports that she does not drink alcohol and does not use drugs.  Allergies  Allergen Reactions  Nsaids     Severe facial swelling/rash    Other Rash    Seldane   Atenolol     Hair loss    Codeine Nausea Only    Nausea    Lyrica [Pregabalin]     Constipation, swelling   Primidone     Emotional lability    Simvastatin     Leg cramps   Sulfa Antibiotics     Pruritis      Family History  Problem Relation Age of Onset   Dementia Mother    Diabetes Mellitus II Father    Prostate cancer Father    Dementia Sister    Breast cancer Sister    Breast cancer Daughter     Prior to Admission medications    Medication Sig Start Date End Date Taking? Authorizing Provider  alendronate (FOSAMAX) 70 MG tablet Take 70 mg by mouth once a week. Take with a full glass of water on an empty stomach.   Yes [provider]  amLODipine-valsartan (EXFORGE) 5-160 MG tablet Take 1 tablet by mouth daily. 10/08/21  Yes [provider]  aspirin EC 81 MG tablet Take 81 mg by mouth 3 (three) times a week. Monday, Wednesday, Friday.   Yes [provider]  atorvastatin (LIPITOR) 10 MG tablet Take 10 mg by mouth every Monday, Wednesday, and Friday.   Yes [provider]  azelastine (OPTIVAR) 0.05 % ophthalmic solution Place 1 drop into both eyes 2 (two) times daily as needed (for allergies). 10/31/21  Yes [provider]  BIOTIN FORTE PO Take by mouth daily.   Yes [provider]  buPROPion (WELLBUTRIN XL) 150 MG 24 hr tablet Take 150 mg by mouth daily. 04/07/21  Yes [provider]  Cholecalciferol (VITAMIN D3) 50 MCG (2000 UT) TABS Take by mouth.   Yes [provider]  donepezil (ARICEPT) 5 MG tablet Take 1 tablet (5 mg total) by mouth at bedtime. 10/08/21  Yes Suzzanne Cloud, NP  famotidine (PEPCID) 20 MG tablet Take 20 mg by mouth daily as needed for heartburn.   Yes [provider]  glimepiride (AMARYL) 2 MG tablet Take 2 mg by mouth 2 (two) times daily. 07/04/16  Yes [provider]  lisinopril (PRINIVIL,ZESTRIL) 5 MG tablet Take 5 mg by mouth every morning.  08/06/12  Yes [provider]  lisinopril-hydrochlorothiazide (ZESTORETIC) 10-12.5 MG tablet Take 1 tablet by mouth daily. 11/05/21  Yes [provider]  loratadine (CLARITIN) 10 MG tablet Take 10 mg by mouth daily as needed for allergies (congestion).    Yes [provider]  MELATONIN PO Take 1 tablet by mouth at bedtime as needed (for sleep).   Yes [provider]  memantine (NAMENDA) 10 MG tablet Take 1 tablet (10 mg total) by mouth 2 (two)  times daily. 10/08/21  Yes Suzzanne Cloud, NP  primidone (MYSOLINE) 50 MG tablet Take 2 tablets (100 mg total) by mouth in the morning and at bedtime. Indication: essential tremors 10/08/21  Yes Suzzanne Cloud, NP  sertraline (ZOLOFT) 25 MG tablet Take 25 mg by mouth daily. 03/15/21  Yes [provider]  sulindac (CLINORIL) 150 MG tablet Take 150 mg by mouth daily as needed (for pain). 11/14/21  Yes [provider]  traZODone (DESYREL) 50 MG tablet Take 50-100 mg by mouth at bedtime as needed for sleep. 03/15/21  Yes [provider]  TRESIBA FLEXTOUCH 100 UNIT/ML SOPN FlexTouch Pen Inject 25-50 Units into the skin daily.  08/06/17  Yes [provider]  molnupiravir EUA (LAGEVRIO) 200 mg CAPS capsule Take 4 capsules by mouth 2 (two) times daily. Patient not taking: Reported on 11/24/2021    [provider]    Physical Exam: Vitals:   11/24/21 0330 11/24/21 0345 11/24/21 0400 11/24/21 0500  BP: (!) 152/59 130/78 (!) 122/99 (!) 119/51  Pulse: (!) 53 (!) 54 (!) 53 (!) 51  Resp: 17 (!) 23 15 19   Temp:      TempSrc:      SpO2: 95% 97% 97% 98%    Physical Exam Vitals reviewed.  Constitutional:      General: She is not in acute distress. HENT:     Head: Normocephalic and atraumatic.     Mouth/Throat:     Mouth: Mucous membranes are dry.  Eyes:     Extraocular Movements: Extraocular movements intact.  Cardiovascular:     Rate and Rhythm: Normal rate and regular rhythm.     Pulses: Normal pulses.  Pulmonary:     Effort: Pulmonary effort is normal. No respiratory distress.     Breath sounds: Normal breath sounds. No wheezing or rales.  Abdominal:     General: Bowel sounds are normal. There is no distension.     Palpations: Abdomen is soft.     Tenderness: There is no abdominal tenderness.  Musculoskeletal:        General: No swelling or tenderness.     Cervical back: Normal range of motion.  Skin:    General: Skin is warm and dry.  Neurological:      General: No focal deficit present.     Mental Status: She is alert and oriented to person, place, and time.     Labs on Admission: I have personally reviewed following labs and imaging studies  CBC: Recent Labs  Lab 11/23/21 2216  WBC 10.3  HGB 12.0  HCT 36.0  MCV 87.8  PLT 242   Basic Metabolic Panel: Recent Labs  Lab 11/23/21 2216  NA 126*  K 4.6  CL 101  CO2 13*  GLUCOSE 164*  BUN 60*  CREATININE 2.11*  CALCIUM 8.9   GFR: CrCl cannot be calculated (Unknown ideal weight.). Liver Function Tests: No results for input(s): "AST", "ALT", "ALKPHOS", "BILITOT", "PROT", "ALBUMIN" in the last 168 hours. No results for input(s): "LIPASE", "AMYLASE" in the last 168 hours. No results for input(s): "AMMONIA" in the last 168 hours. Coagulation Profile: No results for input(s): "INR", "PROTIME" in the last 168 hours. Cardiac Enzymes: No results for input(s): "CKTOTAL", "CKMB", "CKMBINDEX", "TROPONINI" in the last 168 hours. BNP (last 3 results) No results for input(s): "PROBNP" in the last 8760 hours. HbA1C: No results for input(s): "HGBA1C" in the last 72 hours. CBG: Recent Labs  Lab 11/23/21 2226 11/24/21 0230  GLUCAP 164* 166*   Lipid Profile: No results for input(s): "CHOL", "HDL", "LDLCALC", "TRIG", "CHOLHDL", "LDLDIRECT" in the last 72 hours. Thyroid Function Tests: No results for input(s): "TSH", "T4TOTAL", "FREET4", "T3FREE", "THYROIDAB" in the last 72 hours. Anemia Panel: No results for input(s): "VITAMINB12", "FOLATE", "FERRITIN", "TIBC", "IRON", "RETICCTPCT" in the last 72 hours. Urine analysis:    Component Value Date/Time   COLORURINE YELLOW 11/24/2021 0436   APPEARANCEUR HAZY (A) 11/24/2021 0436   LABSPEC 1.008 11/24/2021 0436   PHURINE 5.0 11/24/2021 0436   GLUCOSEU NEGATIVE 11/24/2021 0436   HGBUR NEGATIVE 11/24/2021 0436   BILIRUBINUR NEGATIVE 11/24/2021 0436   KETONESUR NEGATIVE 11/24/2021 0436   PROTEINUR NEGATIVE 11/24/2021 0436  UROBILINOGEN 0.2 06/13/2014 1231   NITRITE POSITIVE (A) 11/24/2021 0436   LEUKOCYTESUR MODERATE (A) 11/24/2021 0436    Radiological Exams on Admission: DG Chest 2 View  Result Date: 11/24/2021 CLINICAL DATA:  Shortness of breath. EXAM: CHEST - 2 VIEW COMPARISON:  06/13/2014. FINDINGS: The heart is enlarged and the mediastinal contour stable. Atherosclerotic calcification of the aorta is noted. There is an oval opacity in the infrahilar region on the right measuring 1.8 cm. Patchy airspace disease is noted at the right lung base. No effusion or pneumothorax. No acute osseous abnormality. IMPRESSION: 1. Patchy airspace disease at the right lung base, possible atelectasis or infiltrate. 2. Oval opacity in the infrahilar region on the right measuring 1.7 cm which is new from the previous exam and indeterminate. Follow-up CT is recommended for further evaluation. 3. Cardiomegaly. Electronically Signed   By: Thornell Sartorius M.D.   On: 11/24/2021 03:21    EKG: Independently reviewed.  Sinus bradycardia.  Rate decreased compared to prior EKG from April 2016.  Assessment and Plan  COVID-19 infection Vomiting and diarrhea SARS-CoV-2 PCR positive.  Chest x-ray showing patchy airspace disease at the right lung base, possible atelectasis or infiltrate. Also showing oval opacity in the infrahilar region on the right measuring 1.7 cm which is new from the previous exam and indeterminate. Patient is reporting mild cough but no dyspnea.  She is not hypoxic.  Diagnosed with COVID a week ago and already finished a 5-day course of molnupiravir.  She is primarily having GI symptoms related to COVID including vomiting and profuse watery diarrhea.  No recent antibiotic use.  No fever or leukocytosis.  No abdominal pain. -Continue isolation precautions at this time -IV fluid hydration -Antiemetic as needed -Check lipase and LFTs -Loperamide as needed -Check inflammatory markers including ferritin, fibrinogen, D-dimer,  CRP, and LDH. -CT chest without contrast  AKI Likely prerenal azotemia from severe dehydration. BUN 60, creatinine 2.1 (baseline around 1.0). -Continue IV fluid hydration -Monitor renal function -Avoid nephrotoxic agents/hold home lisinopril and hydrochlorothiazide  Hyponatremia Due to very poor oral intake and GI losses from vomiting and diarrhea.  Sodium 126. -IV fluid hydration with normal saline -Monitor sodium level closely and avoid rapid correction -Check serum osmolarity  UTI UA with positive nitrite, moderate amount of leukocytes, and microscopy showing 21-50 WBCs and many bacteria.  No signs of sepsis. -Ceftriaxone -Urine culture  Normal anion gap metabolic acidosis Likely due to diarrhea.  Bicarb 13, anion gap 12. -IV fluid hydration with normal saline -Give bicarb supplement if not improving on repeat labs -VBG to check pH  Near syncope Likely orthostatic due to severe dehydration.  No loss of consciousness or fall reported. -Hold antihypertensives at this time and check orthostatics -Fall precautions  Sinus bradycardia Heart rate currently in the 50s and blood pressure stable.  She is chronically bradycardic with heart rate in the 50s during previous office visits as well.  Not on any AV nodal blocking agents. -Cardiac monitoring  Cardiomegaly Appreciated on chest x-ray.  No documented history of CHF. -Echocardiogram  Type 2 diabetes -Check A1c -Sensitive sliding scale insulin every 4 hours for now  Hyperlipidemia -Continue home Lipitor  Depression/anxiety -Continue bupropion and sertraline  Mild cognitive impairment -Continue donezepil and memantine  Essential tremor -Continue primidone  DVT prophylaxis: SQ Heparin Code Status: Full Code (discussed with the patient and her son) Family Communication: Son at bedside. Level of care: Telemetry bed Admission status: It is my clinical opinion that referral for OBSERVATION  is reasonable and necessary  in this patient based on the above information provided. The aforementioned taken together are felt to place the patient at high risk for further clinical deterioration. However, it is anticipated that the patient may be medically stable for discharge from the hospital within 24 to 48 hours.   John GiovanniVasundhra Aspynn Clover MD [facility]  If 7PM-7AM, please contact night-coverage [email or web address]  11/24/2021, 5:17 AM

## 2021-11-24 NOTE — ED Notes (Signed)
Assisted pt to BSC and back to bed.  

## 2021-11-24 NOTE — ED Notes (Signed)
CBG 65. Given full breakfast meal and several juices.

## 2021-11-24 NOTE — Progress Notes (Signed)
PROGRESS NOTE  Brief Narrative: Rebecca Flores is an 81 y.o. female with a history of dementia, bradycardia, T2DM, HTN, HLD, essential tremor, and covid-19 infection Dx by home test 11/18/2021 s/p 5 days molnupiravir who presented to the ED 10/1 with progressive weakness, N/V/D. She was found to have AKI (SCr 2.1 from baseline 1.0) with hyponatremia (Na 532) and metabolic acidosis (bicarb 13) as well as pyuria and bacteriuria. CXR revealed some opacity and subsequent CT revealed multifocal peripheral infiltrates consistent with covid-19 pneumonia as well as a SPN.   Subjective: Patient feels weak and lousy but better, and looks better to her DIL at bedside.   Objective: BP (!) 121/104   Pulse (!) 46   Temp 98.8 F (37.1 C) (Oral)   Resp 19   SpO2 100%   Gen: Elderly, frail female in no distress Pulm: Diminished, nonlabored, normal rate and effort, SpO2 100%  CV: Regular bradycardia, no murmur, no JVD, no pitting edema GI: Soft, NT, ND, +BS  Neuro: Alert and incompletely oriented, limited recall, diffuse essential tremor noted. No focal deficits. Skin: No rashes, lesions or ulcers  Assessment & Plan: Covid-19 virus infection with gastroenteritis and pneumonia:  - Add steroids, has already completed 5 days molnupiravir.  - Inflammatory markers elevated  Dehydration, AKI, hyponatremia, metabolic acidosis:  - Continue IVF, switch to LR, and avoid nephrotoxins.   Pyuria, bacteriuria, urinary frequency:  - Continue CTX pending urine culture  T2DM: Pt/family unclear what she takes, will cover with SSI for now, may need to augment with steroids. Anticipate adding linagliptin, but CBG 65 currently. HbA1c 6.5%.   Bradycardia:  - Will hold aricept for now.   Cardiomegaly: By CXR, appears normal size by CT.  - Echo pending  Essential tremor: Continue primidone  Pulmonary nodule: Reviewed with patient and DIL that a repeat CT is recommended in 3 months.   Patrecia Pour,  MD Pager on amion 11/24/2021, 2:27 PM

## 2021-11-24 NOTE — Progress Notes (Signed)
  Echocardiogram 2D Echocardiogram has been performed.  Rebecca Flores 11/24/2021, 2:42 PM

## 2021-11-24 NOTE — ED Provider Notes (Signed)
[facility] EMERGENCY DEPARTMENT Provider Note   CSN: [id number] Arrival date & time: 11/23/21  2200     History  Chief Complaint  Patient presents with   Fatigue ,Diarrhea, Nausea    Rebecca Flores is a 81 y.o. female.  81 year old female with history of diabetes, hypertension, hyperlipidemia, central tremor presents to the emergency department for generalized weakness.  She was diagnosed with COVID approximately 1 week ago.  Did complete a course of molnupiravir.  Has been experiencing ongoing nausea with anorexia as well as diarrhea.  Patient does have dementia, per family, but reports 1-2 episodes of diarrhea daily.  Family went to check on the patient today to try and have her increase her fluid intake.  She went to get up from the couch when she became very lightheaded and fell backwards.  Landed on her bottom and did not strike her head, but became increasingly nauseous and had 1 episode of vomiting.  Also was incontinent of stool, temporarily disoriented. Family called 911 at this time.  Patient denied shortness of breath in triage.  She is having some mild shortness of breath currently.  No chest pain, fever, urinary symptoms.  The history is provided by the patient. No language interpreter was used.       Home Medications Prior to Admission medications   Medication Sig Start Date End Date Taking? Authorizing Provider  amLODipine-valsartan (EXFORGE) 5-160 MG tablet Take 1 tablet by mouth daily. 10/08/21  Yes [provider]  aspirin EC 81 MG tablet Take 81 mg by mouth 3 (three) times a week. Monday, Wednesday, Friday.   Yes [provider]  atorvastatin (LIPITOR) 10 MG tablet Take 10 mg by mouth every Monday, Wednesday, and Friday.   Yes [provider]  BIOTIN FORTE PO Take by mouth daily.   Yes [provider]  buPROPion (WELLBUTRIN XL) 150 MG 24 hr tablet Take 150 mg by mouth daily. 04/07/21  Yes [provider]  Cholecalciferol (VITAMIN D3) 50 MCG (2000 UT) TABS Take by mouth.   Yes [provider]  donepezil (ARICEPT) 5 MG tablet Take 1 tablet (5 mg total) by mouth at bedtime. 10/08/21  Yes Glean Salvo, NP  famotidine (PEPCID) 20 MG tablet Take 20 mg by mouth daily as needed for heartburn.   Yes [provider]  glimepiride (AMARYL) 2 MG tablet Take 2 mg by mouth 2 (two) times daily. 07/04/16  Yes [provider]  lisinopril (PRINIVIL,ZESTRIL) 5 MG tablet Take 5 mg by mouth every morning.  08/06/12  Yes [provider]  lisinopril-hydrochlorothiazide (ZESTORETIC) 10-12.5 MG tablet Take 1 tablet by mouth daily. 11/05/21  Yes [provider]  loratadine (CLARITIN) 10 MG tablet Take 10 mg by mouth daily as needed for allergies (congestion).    Yes [provider]  MELATONIN PO Take 1 tablet by mouth at bedtime as needed (for sleep).   Yes [provider]  memantine (NAMENDA) 10 MG tablet Take 1 tablet (10 mg total) by mouth 2 (two) times daily. 10/08/21  Yes Glean Salvo, NP  primidone (MYSOLINE) 50 MG tablet Take 2 tablets (100 mg total) by mouth in the morning and at bedtime. Indication: essential tremors 10/08/21  Yes Glean Salvo, NP  sertraline (ZOLOFT) 25 MG tablet Take 25 mg by mouth daily. 03/15/21  Yes [provider]  sulindac (CLINORIL) 150 MG tablet Take 150 mg by mouth daily as needed (for pain). 11/14/21  Yes [provider]  traZODone (DESYREL) 50 MG tablet Take 50-100 mg by mouth at bedtime as needed for sleep. 03/15/21  Yes [provider]  TRESIBA FLEXTOUCH 100 UNIT/ML SOPN FlexTouch Pen Inject 25-50 Units into the skin daily. 08/06/17  Yes [provider]  azelastine (OPTIVAR) 0.05 % ophthalmic solution Place 1 drop into both eyes 2 (two) times daily as needed (for allergies). 10/31/21   [provider]      Allergies    Nsaids, Other, Atenolol, Codeine, Lyrica  [pregabalin], Primidone, Simvastatin, and Sulfa antibiotics    Review of Systems   Review of Systems Ten systems reviewed and are negative for acute change, except as noted in the HPI.    Physical Exam Updated Vital Signs BP (!) 122/99   Pulse (!) 53   Temp 98.5 F (36.9 C) (Oral)   Resp 15   SpO2 97%  Physical Exam Vitals and nursing note reviewed.  Constitutional:      General: She is not in acute distress.    Appearance: She is well-developed. She is not diaphoretic.     Comments: Appears ill, but nontoxic  HENT:     Head: Normocephalic and atraumatic.  Eyes:     General: No scleral icterus.    Conjunctiva/sclera: Conjunctivae normal.  Cardiovascular:     Rate and Rhythm: Regular rhythm. Bradycardia present.     Pulses: Normal pulses.  Pulmonary:     Effort: Pulmonary effort is normal. No respiratory distress.     Breath sounds: No stridor. No wheezing.     Comments: Lungs grossly CTAB. Respirations even and unlabored. Abdominal:     Comments: Abdomen soft, obese.  Musculoskeletal:        General: Normal range of motion.     Cervical back: Normal range of motion.  Skin:    General: Skin is warm and dry.     Coloration: Skin is not pale.     Findings: No erythema or rash.  Neurological:     Mental Status: She is alert and oriented to person, place, and time.     Coordination: Coordination normal.     Comments: B/l hand tremor, baseline. GCS 15. Moving all extremities spontaneously.  Psychiatric:        Behavior: Behavior normal.     ED Results / Procedures / Treatments   Labs (all labs ordered are listed, but only abnormal results are displayed) Labs Reviewed  BASIC METABOLIC PANEL - Abnormal; Notable for the following components:      Result Value   Sodium 126 (*)    CO2 13 (*)    Glucose, Bld 164 (*)    BUN 60 (*)    Creatinine, Ser 2.11 (*)    GFR, Estimated 23 (*)    All other components within normal limits  CBG MONITORING, ED - Abnormal; Notable  for the following components:   Glucose-Capillary 164 (*)    All other components within normal limits  CBG MONITORING, ED - Abnormal; Notable for the following components:   Glucose-Capillary 166 (*)    All other components within normal limits  SARS CORONAVIRUS 2 BY RT PCR  CBC  URINALYSIS, ROUTINE W REFLEX MICROSCOPIC    EKG EKG Interpretation  Date/Time:  Sunday November 23 2021 22:10:46 EDT Ventricular Rate:  51 PR Interval:  154 QRS Duration: 96 QT Interval:  440 QTC Calculation: 405 R Axis:   39 Text Interpretation: Sinus bradycardia Otherwise normal ECG When compared with ECG of 13-Jun-2014 08:50, HEART  RATE has decreased Confirmed by Dione Booze ([id number]) on 11/23/2021 11:28:39 PM  Radiology DG Chest 2 View  Result Date: 11/24/2021 CLINICAL DATA:  Shortness of breath. EXAM: CHEST - 2 VIEW COMPARISON:  06/13/2014. FINDINGS: The heart is enlarged and the mediastinal contour stable. Atherosclerotic calcification of the aorta is noted. There is an oval opacity in the infrahilar region on the right measuring 1.8 cm. Patchy airspace disease is noted at the right lung base. No effusion or pneumothorax. No acute osseous abnormality. IMPRESSION: 1. Patchy airspace disease at the right lung base, possible atelectasis or infiltrate. 2. Oval opacity in the infrahilar region on the right measuring 1.7 cm which is new from the previous exam and indeterminate. Follow-up CT is recommended for further evaluation. 3. Cardiomegaly. Electronically Signed   By: Thornell Sartorius M.D.   On: 11/24/2021 03:21    Procedures Procedures    Medications Ordered in ED Medications  sodium chloride 0.9 % bolus 1,000 mL (0 mLs Intravenous Stopped 11/24/21 0422)  ondansetron (ZOFRAN) injection 4 mg (4 mg Intravenous Given 11/24/21 0327)    ED Course/ Medical Decision Making/ A&P Clinical Course as of 11/24/21 0428  Mon Nov 24, 2021  7622 Case discussed with Dr. L[facility] Loh of [facility] will admit. [KH]    Clinical  Course User Index [KH] Antony Madura, PA-C                           Medical Decision Making Amount and/or Complexity of Data Reviewed Radiology: ordered.  Risk Prescription drug management. Decision regarding hospitalization.   This patient presents to the ED for concern of generalized weakness, this involves an extensive number of treatment options, and is a complaint that carries with it a high risk of complications and morbidity.  The differential diagnosis includes dehydration vs electrolyte derangement vs UTI vs hypoglycemia vs DKA vs viral illness   Co morbidities that complicate the patient evaluation  DM HTN HLD Dementia    Additional history obtained:  Additional history obtained from daughter at bedside External records from outside source obtained and reviewed including last PCP visit in 07/2021   Lab Tests:  I Ordered, and personally interpreted labs.  The pertinent results include: Acute kidney injury with creatinine of 2.11.  Baseline creatinine appears to be somewhere around 1.  The patient has a CO2 of 13 with normal anion gap.  Suspect this is related to GI losses and ongoing diarrhea.  She does have mild hyponatremia of 126.  Urinalysis is pending.  CBC reassuring.   Imaging Studies ordered:  I ordered imaging studies including CXR  I independently visualized and interpreted imaging which showed patchy airspace disease in the right lung base consistent with atelectasis versus infiltrate; likely related to recent COVID diagnosis.  Right infrahilar oval opacity new from prior study. I agree with the radiologist interpretation   Cardiac Monitoring:  The patient was maintained on a cardiac monitor.  I personally viewed and interpreted the cardiac monitored which showed an underlying rhythm of: sinus bradycardia   Medicines ordered and prescription drug management:  I ordered medication including IVF for dehydration, Zofran for nausea  Reevaluation of the  patient after these medicines showed that the patient improved I have reviewed the patients home medicines and have made adjustments as needed   Consultations Obtained:  I requested consultation with Dr. Loney Loh and discussed lab and imaging findings as well as pertinent plan - they agree with plan for admission.  Reevaluation:  After the interventions noted above, I reevaluated the patient and found that they have :improved   Social Determinants of Health:  Good social support Insured patient   Dispostion:  Plan for admission to the hospitalist service for management of AKI.  Started on IV fluids in the emergency department.  Case discussed with Dr. Loney Loh who will admit.         Final Clinical Impression(s) / ED Diagnoses Final diagnoses:  AKI (acute kidney injury) (HCC)  Normal anion gap metabolic acidosis  Hyponatremia    Rx / DC Orders ED Discharge Orders     None         Antony Madura, PA-C 11/24/21 0441    Dione Booze, MD 11/24/21 3790    Dione Booze, MD 11/24/21 [phone]

## 2021-11-24 NOTE — ED Notes (Signed)
Call [name] or [name] with any updates, and with room number if she moves upstairs

## 2021-11-24 NOTE — ED Notes (Signed)
Phlebotomy requested to draw labs.  

## 2021-11-25 ENCOUNTER — Other Ambulatory Visit: Payer: Self-pay

## 2021-11-25 ENCOUNTER — Encounter (HOSPITAL_COMMUNITY): Payer: Self-pay | Admitting: Internal Medicine

## 2021-11-25 DIAGNOSIS — R55 Syncope and collapse: Secondary | ICD-10-CM

## 2021-11-25 DIAGNOSIS — N179 Acute kidney failure, unspecified: Secondary | ICD-10-CM | POA: Diagnosis not present

## 2021-11-25 DIAGNOSIS — F32A Depression, unspecified: Secondary | ICD-10-CM

## 2021-11-25 DIAGNOSIS — E871 Hypo-osmolality and hyponatremia: Secondary | ICD-10-CM

## 2021-11-25 DIAGNOSIS — E1142 Type 2 diabetes mellitus with diabetic polyneuropathy: Secondary | ICD-10-CM

## 2021-11-25 DIAGNOSIS — R1110 Vomiting, unspecified: Secondary | ICD-10-CM

## 2021-11-25 DIAGNOSIS — Z794 Long term (current) use of insulin: Secondary | ICD-10-CM

## 2021-11-25 DIAGNOSIS — G250 Essential tremor: Secondary | ICD-10-CM

## 2021-11-25 DIAGNOSIS — R001 Bradycardia, unspecified: Secondary | ICD-10-CM

## 2021-11-25 DIAGNOSIS — U071 COVID-19: Secondary | ICD-10-CM | POA: Diagnosis not present

## 2021-11-25 DIAGNOSIS — E872 Acidosis, unspecified: Secondary | ICD-10-CM

## 2021-11-25 DIAGNOSIS — R197 Diarrhea, unspecified: Secondary | ICD-10-CM

## 2021-11-25 LAB — CBC
HCT: 30.1 % — ABNORMAL LOW (ref 36.0–46.0)
Hemoglobin: 10 g/dL — ABNORMAL LOW (ref 12.0–15.0)
MCH: 29.4 pg (ref 26.0–34.0)
MCHC: 33.2 g/dL (ref 30.0–36.0)
MCV: 88.5 fL (ref 80.0–100.0)
Platelets: 214 10*3/uL (ref 150–400)
RBC: 3.4 MIL/uL — ABNORMAL LOW (ref 3.87–5.11)
RDW: 12.3 % (ref 11.5–15.5)
WBC: 7.1 10*3/uL (ref 4.0–10.5)
nRBC: 0 % (ref 0.0–0.2)

## 2021-11-25 LAB — GLUCOSE, CAPILLARY
Glucose-Capillary: 126 mg/dL — ABNORMAL HIGH (ref 70–99)
Glucose-Capillary: 207 mg/dL — ABNORMAL HIGH (ref 70–99)

## 2021-11-25 LAB — BASIC METABOLIC PANEL
Anion gap: 7 (ref 5–15)
BUN: 38 mg/dL — ABNORMAL HIGH (ref 8–23)
CO2: 15 mmol/L — ABNORMAL LOW (ref 22–32)
Calcium: 8.6 mg/dL — ABNORMAL LOW (ref 8.9–10.3)
Chloride: 111 mmol/L (ref 98–111)
Creatinine, Ser: 1.08 mg/dL — ABNORMAL HIGH (ref 0.44–1.00)
GFR, Estimated: 51 mL/min — ABNORMAL LOW (ref >60)
Glucose, Bld: 86 mg/dL (ref 70–99)
Potassium: 5 mmol/L (ref 3.5–5.1)
Sodium: 133 mmol/L — ABNORMAL LOW (ref 135–145)

## 2021-11-25 LAB — CBG MONITORING, ED
Glucose-Capillary: 119 mg/dL — ABNORMAL HIGH (ref 70–99)
Glucose-Capillary: 153 mg/dL — ABNORMAL HIGH (ref 70–99)
Glucose-Capillary: 77 mg/dL (ref 70–99)
Glucose-Capillary: 82 mg/dL (ref 70–99)
Glucose-Capillary: 96 mg/dL (ref 70–99)

## 2021-11-25 MED ORDER — MELATONIN 3 MG PO TABS
3.0000 mg | ORAL_TABLET | Freq: Once | ORAL | Status: AC
  Administered 2021-11-25: 3 mg via ORAL
  Filled 2021-11-25: qty 1

## 2021-11-25 MED ORDER — SODIUM BICARBONATE 650 MG PO TABS
650.0000 mg | ORAL_TABLET | Freq: Three times a day (TID) | ORAL | Status: DC
  Administered 2021-11-25 – 2021-11-26 (×5): 650 mg via ORAL
  Filled 2021-11-25 (×5): qty 1

## 2021-11-25 NOTE — Evaluation (Signed)
Occupational Therapy Evaluation Patient Details Name: Rebecca Flores MRN: [id number] DOB: 1940/07/13 Today's Date: 11/25/2021   History of Present Illness Pt is an 81 y/o female who presented with weakness, N&V, diarrhea and near syncope in setting of COVID-19. PMH: mild cognitive impairment, bradycardia, DM2, GERD, HTN, HLD, essential tremor, depression/anxiety.   Clinical Impression   PTA, pt lives alone and reports Independence in all ADLs, IADLs, mobility and driving. Spoke with son, Octavia Bruckner, via phone with reports that pt has assistance with IADLs (such as med mgmt) and they plan to take pt to their home at DC. Pt presents now with deficits in cognition, endurance and overall strength. Pt able to mobilize in room without AD and complete ADLs with Supervision in light of cognitive deficits (attention, memory, awareness). Anticipate no OT needs at DC with family confirming they can assist with ADLs/IADLs as needed at DC. Will continue to follow acutely to maximize safety/independence with daily tasks.      Recommendations for follow up therapy are one component of a multi-disciplinary discharge planning process, led by the attending physician.  Recommendations may be updated based on patient status, additional functional criteria and insurance authorization.   Follow Up Recommendations  No OT follow up    Assistance Recommended at Discharge Intermittent Supervision/Assistance  Patient can return home with the following A little help with bathing/dressing/bathroom;Assistance with cooking/housework;Direct supervision/assist for financial management;Direct supervision/assist for medications management;Assist for transportation    Functional Status Assessment  Patient has had a recent decline in their functional status and demonstrates the ability to make significant improvements in function in a reasonable and predictable amount of time.  Equipment Recommendations  None recommended by OT     Recommendations for Other Services       Precautions / Restrictions Precautions Precautions: Fall Restrictions Weight Bearing Restrictions: No      Mobility Bed Mobility Overal bed mobility: Modified Independent                  Transfers Overall transfer level: Independent Equipment used: None                      Balance Overall balance assessment: Mild deficits observed, not formally tested                                         ADL either performed or assessed with clinical judgement   ADL Overall ADL's : Needs assistance/impaired Eating/Feeding: Independent   Grooming: Supervision/safety;Standing   Upper Body Bathing: Supervision/ safety   Lower Body Bathing: Supervison/ safety   Upper Body Dressing : Supervision/safety   Lower Body Dressing: Supervision/safety   Toilet Transfer: Supervision/safety;Ambulation   Toileting- Clothing Manipulation and Hygiene: Supervision/safety         General ADL Comments: cues needed throughout for attention to tasks, reorientation, etc though pt physically capable of completing ADLs     Vision Baseline Vision/History: 1 Wears glasses Ability to See in Adequate Light: 0 Adequate Patient Visual Report: No change from baseline Vision Assessment?: No apparent visual deficits     Perception     Praxis      Pertinent Vitals/Pain Pain Assessment Pain Assessment: No/denies pain     Hand Dominance Right   Extremity/Trunk Assessment Upper Extremity Assessment Upper Extremity Assessment: Overall WFL for tasks assessed   Lower Extremity Assessment Lower Extremity Assessment: Defer to PT evaluation  Cervical / Trunk Assessment Cervical / Trunk Assessment: Normal   Communication Communication Communication: No difficulties   Cognition Arousal/Alertness: Awake/alert Behavior During Therapy: Restless, Anxious Overall Cognitive Status: History of cognitive impairments - at  baseline                                 General Comments: son reports pt with dementia, memory deficits (repetitively asking about them bringing her purse back to hospital, etc), provides conflicting PLOF info. follows directions consistently though internally distracted and stressed about finding a ride home     General Comments  BP 125/52 after OT entered to find pt off of stretcher and sitting in arm chair in room    Exercises     Shoulder Instructions      Home Living Family/patient expects to be discharged to:: Private residence Living Arrangements: Alone Available Help at Discharge: Family;Available 24 hours/day Type of Home: House Home Access: Stairs to enter [employer] of Steps: 2 Entrance Stairs-Rails: Right;Left Home Layout: One level     Bathroom Shower/Tub: Tub/shower unit;Walk-in shower   Bathroom Toilet: Standard     Home Equipment: None   Additional Comments: pt provided her home setup but son plans to take them to her home. pt reports having a cane that she uses though son reports he has never seen her with a cane      Prior Functioning/Environment Prior Level of Function : Independent/Modified Independent;Patient poor historian/Family not available             Mobility Comments: pt reports using a cane occasionally though when OT spoke to son later, he reports he has never seen her with a cane ADLs Comments: Pt reports Independence in all ADLs, IADLs, driving, med mgmt etc. When later discussing with son, they have been assisting with med mgmt via pill box and other higher level IADLs        OT Problem List: Decreased activity tolerance;Impaired balance (sitting and/or standing);Decreased cognition;Decreased knowledge of use of DME or AE;Decreased safety awareness      OT Treatment/Interventions: Self-care/ADL training;Therapeutic exercise;DME and/or AE instruction;Therapeutic activities    OT Goals(Current goals can be  found in the care plan section) Acute Rehab OT Goals Patient Stated Goal: go home OT Goal Formulation: With patient Time For Goal Achievement: 12/09/21 Potential to Achieve Goals: Good  OT Frequency: Min 2X/week    Co-evaluation              AM-PAC OT "6 Clicks" Daily Activity     Outcome Measure Help from another person eating meals?: None Help from another person taking care of personal grooming?: A Little Help from another person toileting, which includes using toliet, bedpan, or urinal?: A Little Help from another person bathing (including washing, rinsing, drying)?: A Little Help from another person to put on and taking off regular upper body clothing?: A Little Help from another person to put on and taking off regular lower body clothing?: A Little 6 Click Score: 19   End of Session Nurse Communication: Mobility status  Activity Tolerance: Patient tolerated treatment well Patient left: in bed;with call bell/phone within reach  OT Visit Diagnosis: Other abnormalities of gait and mobility (R26.89);Other symptoms and signs involving cognitive function                Time: [id number] OT Time Calculation (min): 20 min Charges:  OT General Charges $OT Visit: 1 Visit OT  Evaluation $OT Eval Low Complexity: 1 Low  Malachy Chamber, OTR/L Acute Rehab Services Office: [phone]   Layla Maw 11/25/2021, 9:53 AM

## 2021-11-25 NOTE — ED Notes (Signed)
Daughter Elayne Guerin [phone] wants an update immediately

## 2021-11-25 NOTE — Progress Notes (Signed)
Physical Therapy Evaluation Patient Details Name: Rebecca Flores MRN: [id number] DOB: 12/09/1940 Today's Date: 11/25/2021  History of Present Illness  Pt is an 81 y/o female who presented with weakness, N&V, diarrhea and near syncope in setting of COVID-19. PMH: mild cognitive impairment, bradycardia, DM2, GERD, HTN, HLD, essential tremor, depression/anxiety.  Clinical Impression  Pt was seen for mobility today after being admitted with near syncopal episode, with gastroenteritis and with New covid dx.  Her family is in attendance to talk with them about her history and plans for dc.  SHe is giving a bit of different hx per OT note, but was more in line with son's information when PT arrived.  Follow acutely for goals of PT and work toward more independent and safe gait, monitoring sats and maintaining safety with all environmental challenges.      Recommendations for follow up therapy are one component of a multi-disciplinary discharge planning process, led by the attending physician.  Recommendations may be updated based on patient status, additional functional criteria and insurance authorization.  Follow Up Recommendations No PT follow up      Assistance Recommended at Discharge Set up Supervision/Assistance  Patient can return home with the following  A little help with walking and/or transfers;Assistance with cooking/housework;Direct supervision/assist for medications management;Direct supervision/assist for financial management;Assist for transportation;Help with stairs or ramp for entrance    Equipment Recommendations None recommended by PT  Recommendations for Other Services       Functional Status Assessment Patient has had a recent decline in their functional status and demonstrates the ability to make significant improvements in function in a reasonable and predictable amount of time.     Precautions / Restrictions Precautions Precautions: Fall Restrictions Weight  Bearing Restrictions: No      Mobility  Bed Mobility Overal bed mobility: Modified Independent                  Transfers Overall transfer level: Modified independent Equipment used: None                    Ambulation/Gait Ambulation/Gait assistance: Supervision Gait Distance (Feet): 40 Feet (distance limited by in room precautions) Assistive device: None Gait Pattern/deviations: Step-through pattern, Narrow base of support, Decreased stride length Gait velocity: reduced Gait velocity interpretation: <1.31 ft/sec, indicative of household ambulator   General Gait Details: mild lateral shifting but no LOB and no issues that appear to be a fall risk  Stairs            Wheelchair Mobility    Modified Rankin (Stroke Patients Only)       Balance Overall balance assessment: Needs assistance Sitting-balance support: Feet supported Sitting balance-Leahy Scale: Good     Standing balance support: No upper extremity supported Standing balance-Leahy Scale: Fair                               Pertinent Vitals/Pain Pain Assessment Pain Assessment: No/denies pain    Home Living Family/patient expects to be discharged to:: Private residence Living Arrangements: Alone Available Help at Discharge: Family;Available 24 hours/day Type of Home: House Home Access: Stairs to enter Entrance Stairs-Rails: Doctor, general practice of Steps: 2   Home Layout: One level Home Equipment: None Additional Comments: per son pt has no equipment    Prior Function Prior Level of Function : Independent/Modified Independent;Patient poor historian/Family not available  Mobility Comments: walks with nothing per pt ADLs Comments: OT note reports some cognitive assist from family     Hand Dominance   Dominant Hand: Right    Extremity/Trunk Assessment   Upper Extremity Assessment Upper Extremity Assessment: Defer to OT evaluation     Lower Extremity Assessment Lower Extremity Assessment: Overall WFL for tasks assessed    Cervical / Trunk Assessment Cervical / Trunk Assessment: Kyphotic (very mild)  Communication   Communication: No difficulties  Cognition Arousal/Alertness: Awake/alert Behavior During Therapy: WFL for tasks assessed/performed Overall Cognitive Status: History of cognitive impairments - at baseline                                 General Comments: dementia on chart        General Comments General comments (skin integrity, edema, etc.): negative hypotension with mobility    Exercises     Assessment/Plan    PT Assessment Patient needs continued PT services  PT Problem List Decreased mobility;Decreased balance;Decreased safety awareness       PT Treatment Interventions DME instruction;Gait training;Stair training;Functional mobility training;Therapeutic activities;Balance training;Therapeutic exercise;Neuromuscular re-education;Patient/family education    PT Goals (Current goals can be found in the Care Plan section)  Acute Rehab PT Goals Patient Stated Goal: to get home and be up walking PT Goal Formulation: With patient/family Time For Goal Achievement: 12/02/21 Potential to Achieve Goals: Good    Frequency Min 3X/week     Co-evaluation               AM-PAC PT "6 Clicks" Mobility  Outcome Measure Help needed turning from your back to your side while in a flat bed without using bedrails?: A Little Help needed moving from lying on your back to sitting on the side of a flat bed without using bedrails?: A Little Help needed moving to and from a bed to a chair (including a wheelchair)?: A Little Help needed standing up from a chair using your arms (e.g., wheelchair or bedside chair)?: A Little Help needed to walk in hospital room?: A Little Help needed climbing 3-5 steps with a railing? : A Lot 6 Click Score: 17    End of Session Equipment Utilized During  Treatment: Gait belt Activity Tolerance: Patient tolerated treatment well Patient left: in bed;with call bell/phone within reach;with family/visitor present Nurse Communication: Mobility status PT Visit Diagnosis: Other abnormalities of gait and mobility (R26.89)    Time: 1540-0867 PT Time Calculation (min) (ACUTE ONLY): 25 min   Charges:   PT Evaluation $PT Eval Moderate Complexity: 1 Mod PT Treatments $Gait Training: 8-22 mins       Ramond Dial 11/25/2021, 3:46 PM  Mee Hives, PT PhD Acute Rehab Dept. Number: [place] [facility] [place]

## 2021-11-25 NOTE — Progress Notes (Signed)
Received patient from ED via stretcher.  Patient is alert and oriented x 4.  Patient is able to get up from the stretcher and walked to the bed with minimal assistance.  Denies pain at this time.  Patient verbalized "I am tired and cold".  Needs addressed.  Assisted in bed in position of comfort.  Patient's son and daughter-in-law at bedside.  Patient and family oriented to room and unit routine.  Call bell within reach.

## 2021-11-25 NOTE — ED Notes (Addendum)
Pt in room A&O x2. Reports that her phone isn't working so she could call her daughter and son. Reoriented pt to surroundings. Pt appears in no distress at this time. Updated on plan of care. Pt adjusted in bed and breakfast tray set-up at bedside. Denies any other needs at this time.

## 2021-11-25 NOTE — Progress Notes (Addendum)
PROGRESS NOTE  Brief Narrative: Rebecca Flores is an 81 y.o. female with a history of dementia, bradycardia, T2DM, HTN, HLD, essential tremor, and covid-19 infection Dx by home test 11/18/2021 s/p 5 days molnupiravir who presented to the ED 10/1 with progressive weakness, N/V/D. She was found to have AKI (SCr 2.1 from baseline 1.0) with hyponatremia (Na 932) and metabolic acidosis (bicarb 13) as well as pyuria and bacteriuria. CXR revealed some opacity and subsequent CT revealed multifocal peripheral infiltrates consistent with covid-19 pneumonia as well as a SPN.   Subjective: Seen still in isolation room in ED awaiting bed upstairs, ate most of breakfast, denies nausea or vomiting. She's had loose stools still. Has a cough but denies shortness of breath at this time. No chest pain or dizziness.   Objective: BP (!) 137/55   Pulse (!) 48   Temp 98 F (36.7 C)   Resp 18   SpO2 99%   Gen: 82 y.o. female in no distress Pulm: Nonlabored with sparse nondependent crackles in R > L lung fields. No wheezes. CV: Regular bradycardia, rate ~60bpm at time of my exam without murmur, rub, or gallop. No JVD, no dependent edema. GI: Abdomen soft, non-tender, non-distended, with normoactive bowel sounds.  Ext: Warm, no deformities Skin: No rashes, lesions or ulcers on visualized skin. Neuro: Alert and incompletely oriented but much more interactive today than yesterday. No focal neurological deficits. Psych: Judgement and insight appear impaired. Mood euthymic & affect congruent. Behavior is appropriate.    Assessment & Plan: Covid-19 virus infection with gastroenteritis and pneumonia:  - Continue steroids, has already completed 5 days molnupiravir.  - Inflammatory markers elevated  Dehydration, AKI, hyponatremia, metabolic acidosis:  - Continue isotonic IVF, decrease rate given her tolerance of po, though still has GI losses and severe acidosis. Add bicarb tabs, monitor BMP.   Pyuria,  bacteriuria, urinary frequency:  - Continue CTX pending urine culture (sent, no result yet)  T2DM: HbA1c 6.5%. Pt/family unclear what she takes, will cover with SSI for now, may need to augment with steroids.   Bradycardia: Stable, asymptomatic, BP acceptable. - Will hold aricept for now.   Cardiomegaly: By CXR, appears normal size by CT.  - Echo with preserved LVEF, mild LVH with normal diastolic parameters, normal RV size and function, no severe valvulopathy.   Essential tremor: Continue primidone  Pulmonary nodule: Reviewed with patient and DIL that a repeat CT is recommended in 3 months.   Dementia: More interactive today.  - Called Alene Mires, and [place] without answer today.  Patrecia Pour, MD Pager on amion 11/25/2021, 11:24 AM

## 2021-11-26 LAB — BASIC METABOLIC PANEL
Anion gap: 8 (ref 5–15)
BUN: 26 mg/dL — ABNORMAL HIGH (ref 8–23)
CO2: 20 mmol/L — ABNORMAL LOW (ref 22–32)
Calcium: 8.8 mg/dL — ABNORMAL LOW (ref 8.9–10.3)
Chloride: 107 mmol/L (ref 98–111)
Creatinine, Ser: 0.9 mg/dL (ref 0.44–1.00)
GFR, Estimated: >60 mL/min (ref >60)
Glucose, Bld: 74 mg/dL (ref 70–99)
Potassium: 4.6 mmol/L (ref 3.5–5.1)
Sodium: 135 mmol/L (ref 135–145)

## 2021-11-26 LAB — GLUCOSE, CAPILLARY
Glucose-Capillary: 111 mg/dL — ABNORMAL HIGH (ref 70–99)
Glucose-Capillary: 68 mg/dL — ABNORMAL LOW (ref 70–99)
Glucose-Capillary: 90 mg/dL (ref 70–99)
Glucose-Capillary: 118 mg/dL — ABNORMAL HIGH (ref 70–99)

## 2021-11-26 LAB — URINE CULTURE: Culture: >=100000 — AB

## 2021-11-26 MED ORDER — AMLODIPINE BESYLATE 5 MG PO TABS
5.0000 mg | ORAL_TABLET | Freq: Every day | ORAL | Status: DC
  Administered 2021-11-26: 5 mg via ORAL
  Filled 2021-11-26: qty 1

## 2021-11-26 MED ORDER — AMLODIPINE BESYLATE-VALSARTAN 5-160 MG PO TABS: 1.0000 | ORAL_TABLET | Freq: Every day | ORAL | Status: DC

## 2021-11-26 MED ORDER — GLIMEPIRIDE 2 MG PO TABS: 2.0000 mg | ORAL_TABLET | Freq: Two times a day (BID) | ORAL | Status: DC

## 2021-11-26 MED ORDER — CEFDINIR 300 MG PO CAPS
300.0000 mg | ORAL_CAPSULE | Freq: Two times a day (BID) | ORAL | Status: DC
  Filled 2021-11-26: qty 1

## 2021-11-26 MED ORDER — PREDNISONE 20 MG PO TABS: 40.0000 mg | ORAL_TABLET | Freq: Every day | ORAL | Status: DC

## 2021-11-26 MED ORDER — IRBESARTAN 150 MG PO TABS
150.0000 mg | ORAL_TABLET | Freq: Every day | ORAL | Status: DC
  Administered 2021-11-26: 150 mg via ORAL
  Filled 2021-11-26: qty 1

## 2021-11-26 MED ORDER — PREDNISONE 20 MG PO TABS: 40.0000 mg | ORAL_TABLET | Freq: Every day | ORAL | 0 refills | Status: DC

## 2021-11-26 MED ORDER — CEFDINIR 300 MG PO CAPS: 300.0000 mg | ORAL_CAPSULE | Freq: Two times a day (BID) | ORAL | 0 refills | Status: DC

## 2021-11-26 NOTE — Progress Notes (Signed)
BG 68, asymptomatic. Gave orange juice, recheck 111.

## 2021-11-26 NOTE — Progress Notes (Signed)
[facility] to be D/C'd  per MD order.  Discussed with the patient family and all questions fully answered.  VSS, Skin clean, dry and intact without evidence of skin break down, no evidence of skin tears noted.  IV catheter discontinued intact. Site without signs and symptoms of complications. Dressing and pressure applied.  An After Visit Summary was printed and given to the patient.   D/c education completed with patient/family including follow up instructions, medication list, d/c activities limitations if indicated, with other d/c instructions as indicated by MD - patient able to verbalize understanding, all questions fully answered.   Patient instructed to return to ED, call 911, or call MD for any changes in condition.   Patient to be escorted via [place], and D/C home via private auto.

## 2021-11-26 NOTE — Discharge Summary (Signed)
Physician Discharge Summary  Rebecca Flores ZOX:[id number] DOB: Jul 25, 1940 DOA: 11/23/2021  PCP: Marden Noble, MD  Admit date: 11/23/2021 Discharge date: 11/26/2021  Admitted From: home Discharge disposition: home with family   Recommendations for Outpatient Follow-Up:   Follow up with PCP: re BP medications: on ACE and ARB?, ? Need for oral DM agent Repeat CT scan of chest in 3 months   Discharge Diagnosis:   Principal Problem:   COVID-19 virus infection Active Problems:   Near syncope   UTI (urinary tract infection)   DM (diabetes mellitus) (HCC)   Essential tremor   Hyponatremia   Depression   AKI (acute kidney injury) (HCC)   Vomiting and diarrhea   Metabolic acidosis   Sinus bradycardia   Cardiomegaly    Discharge Condition: Improved.  Diet recommendation: Low sodium, heart healthy.  Carbohydrate-modified  Wound care: None.  Code status: Full.   History of Present Illness:   hristine Apple Flores is a 81 y.o. female with medical history significant of mild cognitive impairment, bradycardia, type 2 diabetes, GERD, hypertension, hyperlipidemia, essential tremor, depression/anxiety presented to the ED with complaints of generalized weakness, nausea, vomiting, diarrhea, and near syncope in the setting of testing positive for COVID a week ago.  She did complete a course of molnupiravir.  In the ED, patient slightly bradycardic with heart rate in the 50s but remainder of vital signs stable.  Labs showing no leukocytosis or anemia, sodium 126, bicarb 13, anion gap 12, glucose 164, BUN 60, creatinine 2.1 (baseline around 1.0), COVID test pending.  UA with positive nitrite, moderate amount of leukocytes, and microscopy showing 21-50 WBCs and many bacteria.  Chest x-ray showing patchy airspace disease at the right lung base, possible atelectasis or infiltrate.  Also showing oval opacity in the infrahilar region on the right measuring 1.7 cm which is new from  the previous exam and indeterminate.  Follow-up CT recommended for further evaluation.  In addition, showing cardiomegaly. Patient was given Zofran and 1 L normal saline bolus.  TRH called to admit.   History provided by the patient and her son at bedside.  She was diagnosed with COVID infection a week ago and was treated with a 5-day course of molnupiravir.  She had mild cough but no shortness of breath.  She initially improved but then started having profuse watery, nonbloody diarrhea and nausea a few days ago.  Yesterday she started vomiting.  Her oral intake has been very poor this past week and having generalized weakness.  Yesterday she had near syncope but family was with her and she did not fall.  She did not lose consciousness.  Patient denies fevers, chest pain, or abdominal pain.   Hospital Course by Problem:   Covid-19 virus infection with gastroenteritis and pneumonia:  - Continue steroids, has already completed 5 days molnupiravir.  - Inflammatory markers elevated   Dehydration, AKI, hyponatremia, metabolic acidosis:  - resolved   UTI- klebsiella -treat with abx   T2DM: HbA1c 6.5%.  -hold PO medications for now-- continue insulin -close follow up with PCP   Bradycardia:  -monitor outpatient   Cardiomegaly: By CXR, appears normal size by CT.  - Echo with preserved LVEF, mild LVH with normal diastolic parameters, normal RV size and function, no severe valvulopathy.    Essential tremor: Continue primidone   Pulmonary nodule:  - repeat CT is recommended in 3 months.    Dementia: family to monitor at [facility]    Medical Consultants:  Discharge Exam:    Vitals:   11/25/21 2106 11/25/21 2357 11/26/21 0609  BP: (!) 176/67 (!) 137/46 (!) 140/54  Pulse: (!) 55 (!) 50 (!) 51  Resp: Temp: 98.6 F (37 C) 98.8 F (37.1 C) 98.4 F (36.9 C)  TempSrc: Oral Oral Oral  SpO2: 100% 98% 96%    General exam: Appears calm and comfortable.    The results of  significant diagnostics from this hospitalization (including imaging, microbiology, ancillary and laboratory) are listed below for reference.     Procedures and Diagnostic Studies:   ECHOCARDIOGRAM COMPLETE  Result Date: 11/24/2021    ECHOCARDIOGRAM REPORT   Patient Name:   Rebecca Flores Date of Exam: 11/24/2021 Medical Rec #:  [id number]              Height:       65.0 in Accession #:    [id number]             Weight:       154.5 lb Date of Birth:  Aug 27, 1940              BSA:          1.773 m Patient Age:    81 years               BP:           121/104 mmHg Patient Gender: F                      HR:           48 bpm. Exam Location:  Inpatient Procedure: 2D Echo, 3D Echo, Color Doppler and Cardiac Doppler Indications:    Cardiomegaly  History:        Patient has no prior history of Echocardiogram examinations.                 Cardiomegaly, COVID, Arrythmias:Bradycardia; Risk                 Factors:Diabetes, Hypertension, Dyslipidemia and Former Smoker.  Sonographer:    Aron Baba Referring Phys: [id number] GEXBMWUXL RATHORE  Sonographer Comments: Image acquisition challenging due to respiratory motion. IMPRESSIONS  1. Left ventricular ejection fraction, by estimation, is 65 to 70%. Left ventricular ejection fraction by 3D volume is 69 %. The left ventricle has normal function. The left ventricle has no regional wall motion abnormalities. There is mild left ventricular hypertrophy. Left ventricular diastolic parameters were normal.  2. Right ventricular systolic function is normal. The right ventricular size is normal. There is normal pulmonary artery systolic pressure. The estimated right ventricular systolic pressure is 23.4 mmHg.  3. The mitral valve is normal in structure. Mild mitral valve regurgitation. No evidence of mitral stenosis.  4. The aortic valve is tricuspid. Aortic valve regurgitation is trivial. No aortic stenosis is present.  5. The inferior vena cava is normal in size with greater  than 50% respiratory variability, suggesting right atrial pressure of 3 mmHg. FINDINGS  Left Ventricle: Left ventricular ejection fraction, by estimation, is 65 to 70%. Left ventricular ejection fraction by 3D volume is 69 %. The left ventricle has normal function. The left ventricle has no regional wall motion abnormalities. The left ventricular internal cavity size was normal in size. There is mild left ventricular hypertrophy. Left ventricular diastolic parameters were normal. Right Ventricle: The right ventricular size is normal. No increase in right ventricular wall thickness. Right ventricular systolic function is  normal. There is normal pulmonary artery systolic pressure. The tricuspid regurgitant velocity is 2.26 m/s, and  with an assumed right atrial pressure of 3 mmHg, the estimated right ventricular systolic pressure is 23.4 mmHg. Left Atrium: Left atrial size was normal in size. Right Atrium: Right atrial size was normal in size. Pericardium: There is no evidence of pericardial effusion. Mitral Valve: The mitral valve is normal in structure. Mild mitral valve regurgitation. No evidence of mitral valve stenosis. Tricuspid Valve: The tricuspid valve is normal in structure. Tricuspid valve regurgitation is trivial. Aortic Valve: The aortic valve is tricuspid. Aortic valve regurgitation is trivial. No aortic stenosis is present. Pulmonic Valve: The pulmonic valve was not well visualized. Pulmonic valve regurgitation is trivial. Aorta: The aortic root and ascending aorta are structurally normal, with no evidence of dilitation. Venous: The inferior vena cava is normal in size with greater than 50% respiratory variability, suggesting right atrial pressure of 3 mmHg. IAS/Shunts: The interatrial septum was not well visualized.  LEFT VENTRICLE PLAX 2D LVIDd:         4.20 cm LVIDs:         2.95 cm LV PW:         1.00 cm         3D Volume EF LV IVS:        0.90 cm         LV 3D EF:    Left LVOT diam:     1.70 cm                       ventricul LV SV:         78                           ar LV SV Index:   44                           ejection LVOT Area:     2.27 cm                     fraction                                             by 3D                                             volume is                                             69 %.                                 3D Volume EF:                                3D EF:        69 %  LV EDV:       128 ml                                LV ESV:       39 ml                                LV SV:        89 ml RIGHT VENTRICLE RV S prime:     16.20 cm/s TAPSE (M-mode): 2.2 cm LEFT ATRIUM             Index        RIGHT ATRIUM           Index LA diam:        3.90 cm 2.20 cm/m   RA Area:     17.00 cm LA Vol (A2C):   46.6 ml 26.29 ml/m  RA Volume:   44.40 ml  25.05 ml/m LA Vol (A4C):   59.7 ml 33.68 ml/m LA Biplane Vol: 56.3 ml 31.76 ml/m  AORTIC VALVE             PULMONIC VALVE LVOT Vmax:   144.00 cm/s PR End Diast Vel: 3.98 msec LVOT Vmean:  90.100 cm/s LVOT VTI:    0.344 m  AORTA Ao Root diam: 3.50 cm Ao Asc diam:  3.10 cm MITRAL VALVE                TRICUSPID VALVE MV Area (PHT): 7.16 cm     TR Peak grad:   20.4 mmHg MV Decel Time: 106 msec     TR Vmax:        226.00 cm/s MV E velocity: 121.00 cm/s MV A velocity: 106.00 cm/s  SHUNTS MV E/A ratio:  1.14         Systemic VTI:  0.34 m                             Systemic Diam: 1.70 cm Epifanio Lesches MD Electronically signed by Epifanio Lesches MD Signature Date/Time: 11/24/2021/2:57:53 PM    Final    CT CHEST WO CONTRAST  Result Date: 11/24/2021 CLINICAL DATA:  81 year old female with history of respiratory distress. History of COVID infection. EXAM: CT CHEST WITHOUT CONTRAST TECHNIQUE: Multidetector CT imaging of the chest was performed following the standard protocol without IV contrast. RADIATION DOSE REDUCTION: This exam was performed according to the departmental  dose-optimization program which includes automated exposure control, adjustment of the mA and/or kV according to patient size and/or use of iterative reconstruction technique. COMPARISON:  No priors. FINDINGS: Cardiovascular: Heart size is normal. There is no significant pericardial fluid, thickening or pericardial calcification. There is aortic atherosclerosis, as well as atherosclerosis of the great vessels of the mediastinum and the coronary arteries, including calcified atherosclerotic plaque in the left main, left anterior descending and left circumflex coronary arteries. Mediastinum/Nodes: No pathologically enlarged mediastinal or hilar lymph nodes. Please note that accurate exclusion of hilar adenopathy is limited on noncontrast CT scans. Small hiatal hernia. No axillary lymphadenopathy. Lungs/Pleura: Patchy areas of ground-glass attenuation and some mild septal thickening are noted throughout the lungs bilaterally, randomly distributed. No pleural effusions. Left lower lobe pulmonary nodule (axial image 111 of series 4) measuring 1.1 x 0.9 cm (mean diameter of 1.0 cm). Upper Abdomen: Aortic  atherosclerosis.  Status post cholecystectomy. Musculoskeletal: There are no aggressive appearing lytic or blastic lesions noted in the visualized portions of the skeleton. IMPRESSION: 1. The appearance of the lungs is most suggestive of multilobar pneumonia, potentially from viral infection such as reported COVID pneumonia. 2. Left lower lobe pulmonary nodule with a mean diameter of 1 cm. Consider one of the following in 3 months for both low-risk and high-risk individuals: (a) repeat chest CT or (b) follow-up PET-CT. This recommendation follows the consensus statement: Guidelines for Management of Incidental Pulmonary Nodules Detected on CT Images: From the Fleischner Society 2017; Radiology 2017; 284:228-243. 3. Aortic atherosclerosis, in addition to left main and 2 vessel coronary artery disease. Please note that  although the presence of coronary artery calcium documents the presence of coronary artery disease, the severity of this disease and any potential stenosis cannot be assessed on this non-gated CT examination. Assessment for potential risk factor modification, dietary therapy or pharmacologic therapy may be warranted, if clinically indicated. Aortic Atherosclerosis (ICD10-I70.0). Electronically Signed   By: Trudie Reed M.D.   On: 11/24/2021 07:36   DG Chest 2 View  Result Date: 11/24/2021 CLINICAL DATA:  Shortness of breath. EXAM: CHEST - 2 VIEW COMPARISON:  06/13/2014. FINDINGS: The heart is enlarged and the mediastinal contour stable. Atherosclerotic calcification of the aorta is noted. There is an oval opacity in the infrahilar region on the right measuring 1.8 cm. Patchy airspace disease is noted at the right lung base. No effusion or pneumothorax. No acute osseous abnormality. IMPRESSION: 1. Patchy airspace disease at the right lung base, possible atelectasis or infiltrate. 2. Oval opacity in the infrahilar region on the right measuring 1.7 cm which is new from the previous exam and indeterminate. Follow-up CT is recommended for further evaluation. 3. Cardiomegaly. Electronically Signed   By: Thornell Sartorius M.D.   On: 11/24/2021 03:21     Labs:   Basic Metabolic Panel: Recent Labs  Lab 11/23/21 2216 11/24/21 0929 11/24/21 0934 11/25/21 0359 11/26/21 0259  NA 126* 131* 130* 133* 135  K 4.6 4.7 4.5 5.0 4.6  CL 101 108  --  111 107  CO2 13* 14*  --  15* 20*  GLUCOSE 164* 106*  --  86 74  BUN 60* 51*  --  38* 26*  CREATININE 2.11* 1.56*  --  1.08* 0.90  CALCIUM 8.9 8.4*  --  8.6* 8.8*   GFR CrCl cannot be calculated (Unknown ideal weight.). Liver Function Tests: Recent Labs  Lab 11/24/21 0929  AST 19  ALT 12  ALKPHOS 91  BILITOT 0.4  PROT 6.0*  ALBUMIN 3.0*   Recent Labs  Lab 11/24/21 0929  LIPASE 59*   No results for input(s): "AMMONIA" in the last 168  hours. Coagulation profile No results for input(s): "INR", "PROTIME" in the last 168 hours.  CBC: Recent Labs  Lab 11/23/21 2216 11/24/21 0934 11/25/21 0359  WBC 10.3  --  7.1  HGB 12.0 10.2* 10.0*  HCT 36.0 30.0* 30.1*  MCV 87.8  --  88.5  PLT 242  --  214   Cardiac Enzymes: No results for input(s): "CKTOTAL", "CKMB", "CKMBINDEX", "TROPONINI" in the last 168 hours. BNP: Invalid input(s): "POCBNP" CBG: Recent Labs  Lab 11/25/21 2353 11/26/21 0458 11/26/21 0529 11/26/21 0638 11/26/21 0822  GLUCAP 126* 68* 111* 90 118*   D-Dimer Recent Labs    11/24/21 0929  DDIMER 1.86*   Hgb A1c Recent Labs    11/24/21 0929  HGBA1C 6.5*  Lipid Profile No results for input(s): "CHOL", "HDL", "LDLCALC", "TRIG", "CHOLHDL", "LDLDIRECT" in the last 72 hours. Thyroid function studies No results for input(s): "TSH", "T4TOTAL", "T3FREE", "THYROIDAB" in the last 72 hours.  Invalid input(s): "FREET3" Anemia work up Recent Labs    11/24/21 0929  FERRITIN 220   Microbiology Recent Results (from the past 240 hour(s))  SARS Coronavirus 2 by RT PCR (hospital order, performed in [facility] hospital lab) *cepheid single result test* Anterior Nasal Swab     Status: Abnormal   Collection Time: 11/24/21  4:18 AM   Specimen: Anterior Nasal Swab  Result Value Ref Range Status   SARS Coronavirus 2 by RT PCR POSITIVE (A) NEGATIVE Final    Comment: (NOTE) SARS-CoV-2 target nucleic acids are DETECTED  SARS-CoV-2 RNA is generally detectable in upper respiratory specimens  during the acute phase of infection.  Positive results are indicative  of the presence of the identified virus, but do not rule out bacterial infection or co-infection with other pathogens not detected by the test.  Clinical correlation with patient history and  other diagnostic information is necessary to determine patient infection status.  The expected result is negative.  Fact Sheet for Patients:    https://www.patel.info/   Fact Sheet for Healthcare Providers:   https://hall.com/    This test is not yet approved or cleared by the [place] FDA and  has been authorized for detection and/or diagnosis of SARS-CoV-2 by FDA under an Emergency Use Authorization (EUA).  This EUA will remain in effect (meaning this test can be used) for the duration of  the COVID-19 declaration under Section 564(b)(1)  of the Act, 21 U.S.C. section 360-bbb-3(b)(1), unless the authorization is terminated or revoked sooner.   Performed at [facility], [place] [address] [place], [place] [address]   Urine Culture     Status: Abnormal   Collection Time: 11/24/21  4:36 AM   Specimen: Urine, Clean Catch  Result Value Ref Range Status   Specimen Description URINE, CLEAN CATCH  Final   Special Requests   Final    NONE Performed at [facility], [place] [address] [place], [place] [address]    Culture >=100,000 COLONIES/mL KLEBSIELLA PNEUMONIAE (A)  Final   Report Status 11/26/2021 FINAL  Final   Organism ID, Bacteria KLEBSIELLA PNEUMONIAE (A)  Final      Susceptibility   Klebsiella pneumoniae - MIC*    AMPICILLIN >=32 RESISTANT Resistant     CEFAZOLIN <=4 SENSITIVE Sensitive     CEFEPIME <=0.12 SENSITIVE Sensitive     CEFTRIAXONE <=0.25 SENSITIVE Sensitive     CIPROFLOXACIN <=0.25 SENSITIVE Sensitive     GENTAMICIN <=1 SENSITIVE Sensitive     IMIPENEM <=0.25 SENSITIVE Sensitive     NITROFURANTOIN 32 SENSITIVE Sensitive     TRIMETH/SULFA <=20 SENSITIVE Sensitive     AMPICILLIN/SULBACTAM 8 SENSITIVE Sensitive     PIP/TAZO <=4 SENSITIVE Sensitive     * >=100,000 COLONIES/mL KLEBSIELLA PNEUMONIAE     Discharge Instructions:   Discharge Instructions     Diet - low sodium heart healthy   Complete by: As directed    Diet Carb Modified   Complete by: As directed    Discharge instructions   Complete by: As directed    Have held you  lisinopril/HCTZ for now as you are already on a similar medication.  Your blood sugars have been well controlled in the hospital so I have held your Amaryl for 1 week-- if blood  sugars are still <200 consistently after would hold Amaryl and speak with PCP prior to starting   Increase activity slowly   Complete by: As directed       Allergies as of 11/26/2021       Reactions   Nsaids    Severe facial swelling/rash   Other Rash   Seldane   Atenolol    Hair loss   Codeine Nausea Only   Nausea   Lyrica [pregabalin]    Constipation, swelling   Primidone    Emotional lability   Simvastatin    Leg cramps   Sulfa Antibiotics    Pruritis        Medication List     STOP taking these medications    lisinopril 5 MG tablet Commonly known as: ZESTRIL   lisinopril-hydrochlorothiazide 10-12.5 MG tablet Commonly known as: ZESTORETIC   molnupiravir EUA 200 mg Caps capsule Commonly known as: LAGEVRIO       TAKE these medications    alendronate 70 MG tablet Commonly known as: FOSAMAX Take 70 mg by mouth once a week. Take with a full glass of water on an empty stomach.   amLODipine-valsartan 5-160 MG tablet Commonly known as: EXFORGE Take 1 tablet by mouth daily.   aspirin EC 81 MG tablet Take 81 mg by mouth 3 (three) times a week. Monday, Wednesday, Friday.   atorvastatin 10 MG tablet Commonly known as: LIPITOR Take 10 mg by mouth every Monday, Wednesday, and Friday.   azelastine 0.05 % ophthalmic solution Commonly known as: OPTIVAR Place 1 drop into both eyes 2 (two) times daily as needed (for allergies).   BIOTIN FORTE PO Take by mouth daily.   buPROPion 150 MG 24 hr tablet Commonly known as: WELLBUTRIN XL Take 150 mg by mouth daily.   cefdinir 300 MG capsule Commonly known as: OMNICEF Take 1 capsule (300 mg total) by mouth every 12 (twelve) hours.   donepezil 5 MG tablet Commonly known as: ARICEPT Take 1 tablet (5 mg total) by mouth at bedtime.    famotidine 20 MG tablet Commonly known as: PEPCID Take 20 mg by mouth daily as needed for heartburn.   glimepiride 2 MG tablet Commonly known as: AMARYL Take 1 tablet (2 mg total) by mouth 2 (two) times daily. Start taking on: December 03, 2021 What changed: These instructions start on December 03, 2021. If you are unsure what to do until then, ask your doctor or other care provider.   loratadine 10 MG tablet Commonly known as: CLARITIN Take 10 mg by mouth daily as needed for allergies (congestion).   MELATONIN PO Take 1 tablet by mouth at bedtime as needed (for sleep).   memantine 10 MG tablet Commonly known as: NAMENDA Take 1 tablet (10 mg total) by mouth 2 (two) times daily.   predniSONE 20 MG tablet Commonly known as: DELTASONE Take 2 tablets (40 mg total) by mouth daily with breakfast. Start taking on: November 27, 2021   primidone 50 MG tablet Commonly known as: MYSOLINE Take 2 tablets (100 mg total) by mouth in the morning and at bedtime. Indication: essential tremors   sertraline 25 MG tablet Commonly known as: ZOLOFT Take 25 mg by mouth daily.   sulindac 150 MG tablet Commonly known as: CLINORIL Take 150 mg by mouth daily as needed (for pain).   traZODone 50 MG tablet Commonly known as: DESYREL Take 50-100 mg by mouth at bedtime as needed for sleep.   Evaristo Buryresiba FlexTouch 100 UNIT/ML FlexTouch Pen  Generic drug: insulin degludec Inject 25-50 Units into the skin daily.   Vitamin D3 50 MCG (2000 UT) Tabs Take by mouth.        Follow-up Information     Marden Noble, MD Follow up in 1 week(s).   Specialty: Internal Medicine Why: discuss BP medications-- on ACE and ARB Contact information: [address] [place] [address] [phone]                  Time coordinating discharge: 45 min  Signed:  Joseph Art DO  [facility] 11/26/2021, 10:40 AM

## 2021-11-26 NOTE — Progress Notes (Signed)
  Transition of Care [facility]) Screening Note   Patient Details  Name: Rebecca Flores Date of Birth: 12/27/1940   Transition of Care [facility]) CM/SW Contact:    Bartholomew Crews, RN Phone Number: [phone] 11/26/2021, 8:26 AM   Transition of Care Department [facility]) has reviewed patient and no TOC needs have been identified at this time. We will continue to monitor patient advancement through interdisciplinary progression rounds. If new patient transition needs arise, please place a TOC consult.

## 2021-12-03 DIAGNOSIS — I10 Essential (primary) hypertension: Secondary | ICD-10-CM | POA: Diagnosis not present

## 2021-12-03 DIAGNOSIS — E559 Vitamin D deficiency, unspecified: Secondary | ICD-10-CM | POA: Diagnosis not present

## 2021-12-03 DIAGNOSIS — R413 Other amnesia: Secondary | ICD-10-CM | POA: Diagnosis not present

## 2021-12-03 DIAGNOSIS — E1165 Type 2 diabetes mellitus with hyperglycemia: Secondary | ICD-10-CM | POA: Diagnosis not present

## 2021-12-03 DIAGNOSIS — R251 Tremor, unspecified: Secondary | ICD-10-CM | POA: Diagnosis not present

## 2021-12-03 DIAGNOSIS — D649 Anemia, unspecified: Secondary | ICD-10-CM | POA: Diagnosis not present

## 2021-12-03 DIAGNOSIS — R9389 Abnormal findings on diagnostic imaging of other specified body structures: Secondary | ICD-10-CM | POA: Diagnosis not present

## 2022-02-18 ENCOUNTER — Other Ambulatory Visit: Payer: Self-pay | Admitting: Internal Medicine

## 2022-02-18 DIAGNOSIS — R911 Solitary pulmonary nodule: Secondary | ICD-10-CM

## 2022-03-31 ENCOUNTER — Ambulatory Visit
Admission: RE | Admit: 2022-03-31 | Discharge: 2022-03-31 | Disposition: A | Discharge status: Home / Self Care | Payer: Medicare Other | Source: Ambulatory Visit | Attending: Internal Medicine | Admitting: Internal Medicine

## 2022-03-31 DIAGNOSIS — R911 Solitary pulmonary nodule: Secondary | ICD-10-CM

## 2022-04-16 NOTE — Progress Notes (Signed)
Synopsis: Referred in Feb 2024 for abnormal CT chest by Kathalene Frames, *  Subjective:   PATIENT ID: Rebecca Flores GENDER: female DOB: [birth year], MRN: [id number]  Chief Complaint  Patient presents with   Consult    Lung nodule.    This is an 82 year old female, past medical history of diabetes, gastroesophageal reflux, hypercholesterolemia, hypertension, tobacco use.  Patient is a former smoker quit in March 2011.Patient was referred after having CT imaging of the chest completed on 03/31/2022.  Patient had some areas of groundglass within the chest and some nodularity.  Most notable was a 1.8 x 1.3 cm nodule in the right upper lobe which looks very similar to his previous study in October 2023.  There was also a pleural-based nodule in the left upper lobe.  And a right lower lobe 1 cm nodule that looked like it had partially resolved favoring inflammation.  Patient from respiratory stance has no complaints today.  She is very anxious about the findings on CT.  Her CT scan does show significant proved from October but in October she was diagnosed with COVID-19.    Past Medical History:  Diagnosis Date   Bradycardia    Colon polyp    Diabetes mellitus    Family history of anesthesia complication    sister hard to wake up   GERD (gastroesophageal reflux disease)    Hypercholesteremia    Hypertension    Kidney stone    Movement disorder    Osteoporosis    Peripheral neuropathy    Seasonal allergies    Sepsis ([place])    Tobacco use    Tremors of nervous system      Family History  Problem Relation Age of Onset   Dementia Mother    Diabetes Mellitus II Father    Prostate cancer Father    Dementia Sister    Breast cancer Sister    Breast cancer Daughter      Past Surgical History:  Procedure Laterality Date   ABDOMINAL HYSTERECTOMY     APPENDECTOMY     bladder tacking     CHOLECYSTECTOMY  1998   COLONOSCOPY     HERNIA REPAIR  05/2013   INGUINAL HERNIA  REPAIR Right 06/27/2013   Procedure: RIGHT INGUINAL HERNIA REPAIR WITH MESH;  Surgeon: Earnstine Regal, MD;  Location: [facility];  Service: General;  Laterality: Right;   INSERTION OF MESH Right 06/27/2013   Procedure: INSERTION OF MESH;  Surgeon: Earnstine Regal, MD;  Location: [facility];  Service: General;  Laterality: Right;   MOUTH SURGERY     TONSILLECTOMY      Social History   Socioeconomic History   Marital status: Widowed    Spouse name: Not on file   Number of children: 2   Years of education: 9TH   Highest education level: Not on file  Occupational History    Employer: RETIRED  Tobacco Use   Smoking status: Former    Types: Cigarettes    Quit date: 04/23/2009    Years since quitting: 12.9   Smokeless tobacco: Never  Substance and Sexual Activity   Alcohol use: No    Alcohol/week: 1.0 standard drink of alcohol    Types: 1 Glasses of wine per week   Drug use: No   Sexual activity: Never  Other Topics Concern   Not on file  Social History Narrative   Patient is widowed with 2 children   09/07/16 Lives alone  Patient has a 9th grade education   Patient is right handed   Patient does not drink caffeine   Social Determinants of Health   Financial Resource Strain: Not on file  Food Insecurity: Unknown (11/25/2021)   Hunger Vital Sign    Worried About Running Out of Food in the Last Year: Patient refused    [place] in the Last Year: Patient refused  Transportation Needs: No Transportation Needs (11/25/2021)   PRAPARE - Hydrologist (Medical): No    Lack of Transportation (Non-Medical): No  Physical Activity: Not on file  Stress: Not on file  Social Connections: Not on file  Intimate Partner Violence: Not At Risk (11/25/2021)   Humiliation, Afraid, Rape, and Kick questionnaire    Fear of Current or Ex-Partner: No    Emotionally Abused: No    Physically Abused: No    Sexually Abused: No     Allergies   Allergen Reactions   Nsaids     Severe facial swelling/rash    Other Rash    Seldane   Atenolol     Hair loss    Codeine Nausea Only    Nausea    Lyrica [Pregabalin]     Constipation, swelling   Primidone     Emotional lability    Simvastatin     Leg cramps   Sulfa Antibiotics     Pruritis       Outpatient Medications Prior to Visit  Medication Sig Dispense Refill   alendronate (FOSAMAX) 70 MG tablet Take 70 mg by mouth once a week. Take with a full glass of water on an empty stomach.     amLODipine-valsartan (EXFORGE) 5-160 MG tablet Take 1 tablet by mouth daily.     aspirin EC 81 MG tablet Take 81 mg by mouth 3 (three) times a week. Monday, Wednesday, Friday.     atorvastatin (LIPITOR) 10 MG tablet Take 10 mg by mouth every Monday, Wednesday, and Friday.     azelastine (OPTIVAR) 0.05 % ophthalmic solution Place 1 drop into both eyes 2 (two) times daily as needed (for allergies).     BIOTIN FORTE PO Take by mouth daily.     buPROPion (WELLBUTRIN XL) 150 MG 24 hr tablet Take 150 mg by mouth daily.     cefdinir (OMNICEF) 300 MG capsule Take 1 capsule (300 mg total) by mouth every 12 (twelve) hours. 4 capsule 0   Cholecalciferol (VITAMIN D3) 50 MCG (2000 UT) TABS Take by mouth.     donepezil (ARICEPT) 5 MG tablet Take 1 tablet (5 mg total) by mouth at bedtime. 90 tablet 3   famotidine (PEPCID) 20 MG tablet Take 20 mg by mouth daily as needed for heartburn.     glimepiride (AMARYL) 2 MG tablet Take 1 tablet (2 mg total) by mouth 2 (two) times daily.     loratadine (CLARITIN) 10 MG tablet Take 10 mg by mouth daily as needed for allergies (congestion).      MELATONIN PO Take 1 tablet by mouth at bedtime as needed (for sleep).     memantine (NAMENDA) 10 MG tablet Take 1 tablet (10 mg total) by mouth 2 (two) times daily. 180 tablet 3   predniSONE (DELTASONE) 20 MG tablet Take 2 tablets (40 mg total) by mouth daily with breakfast. 4 tablet 0   primidone (MYSOLINE) 50 MG tablet  Take 2 tablets (100 mg total) by mouth in the morning and at bedtime. Indication: essential  tremors 360 tablet 4   sertraline (ZOLOFT) 25 MG tablet Take 25 mg by mouth daily.     sulindac (CLINORIL) 150 MG tablet Take 150 mg by mouth daily as needed (for pain).     traZODone (DESYREL) 50 MG tablet Take 50-100 mg by mouth at bedtime as needed for sleep.     TRESIBA FLEXTOUCH 100 UNIT/ML SOPN FlexTouch Pen Inject 25-50 Units into the skin daily.  6   No facility-administered medications prior to visit.    Review of Systems  Constitutional:  Negative for chills, fever, malaise/fatigue and weight loss.  HENT:  Negative for hearing loss, sore throat and tinnitus.   Eyes:  Negative for blurred vision and double vision.  Respiratory:  Negative for cough, hemoptysis, sputum production, shortness of breath, wheezing and stridor.   Cardiovascular:  Negative for chest pain, palpitations, orthopnea, leg swelling and PND.  Gastrointestinal:  Negative for abdominal pain, constipation, diarrhea, heartburn, nausea and vomiting.  Genitourinary:  Negative for dysuria, hematuria and urgency.  Musculoskeletal:  Negative for joint pain and myalgias.  Skin:  Negative for itching and rash.  Neurological:  Negative for dizziness, tingling, weakness and headaches.  Endo/Heme/Allergies:  Negative for environmental allergies. Does not bruise/bleed easily.  Psychiatric/Behavioral:  Negative for depression. The patient is not nervous/anxious and does not have insomnia.   All other systems reviewed and are negative.    Objective:  Physical Exam Vitals reviewed.  Constitutional:      General: She is not in acute distress.    Appearance: She is well-developed.  HENT:     Head: Normocephalic and atraumatic.  Eyes:     General: No scleral icterus.    Conjunctiva/sclera: Conjunctivae normal.     Pupils: Pupils are equal, round, and reactive to light.  Neck:     Vascular: No JVD.     Trachea: No tracheal  deviation.  Cardiovascular:     Rate and Rhythm: Normal rate and regular rhythm.     Heart sounds: Normal heart sounds. No murmur heard. Pulmonary:     Effort: Pulmonary effort is normal. No tachypnea, accessory muscle usage or respiratory distress.     Breath sounds: No stridor. No wheezing, rhonchi or rales.  Abdominal:     General: There is no distension.     Palpations: Abdomen is soft.     Tenderness: There is no abdominal tenderness.  Musculoskeletal:        General: No tenderness.     Cervical back: Neck supple.  Lymphadenopathy:     Cervical: No cervical adenopathy.  Skin:    General: Skin is warm and dry.     Capillary Refill: Capillary refill takes less than 2 seconds.     Findings: No rash.  Neurological:     Mental Status: She is alert and oriented to person, place, and time.  Psychiatric:        Behavior: Behavior normal.      Vitals:   04/17/22 0830  BP: (!) 140/60  Pulse: (!) 51  SpO2: 96%  Weight: 151 lb 3.2 oz (68.6 kg)  Height: '5\' 5"'$  (1.651 m)   96% on RA BMI Readings from Last 3 Encounters:  04/17/22 25.16 kg/m  10/08/21 25.71 kg/m  04/08/21 26.63 kg/m   Wt Readings from Last 3 Encounters:  04/17/22 151 lb 3.2 oz (68.6 kg)  10/08/21 154 lb 8 oz (70.1 kg)  04/08/21 160 lb (72.6 kg)     CBC    Component Value Date/Time  WBC 7.1 11/25/2021 0359   RBC 3.40 (L) 11/25/2021 0359   HGB 10.0 (L) 11/25/2021 0359   HGB 12.0 06/05/2020 1404   HCT 30.1 (L) 11/25/2021 0359   HCT 35.4 06/05/2020 1404   PLT 214 11/25/2021 0359   PLT 220 06/05/2020 1404   MCV 88.5 11/25/2021 0359   MCV 88 06/05/2020 1404   MCH 29.4 11/25/2021 0359   MCHC 33.2 11/25/2021 0359   RDW 12.3 11/25/2021 0359   RDW 12.8 06/05/2020 1404   LYMPHSABS 2.8 06/05/2020 1404   MONOABS 1.8 (H) 06/13/2014 0848   EOSABS 0.2 06/05/2020 1404   BASOSABS 0.1 06/05/2020 1404     Chest Imaging: 03/31/2022 CT chest: Multiple small areas of nodularity areas of groundglass within  the chest. Possible underlying malignancy versus inflammatory lesions. The patient's images have been independently reviewed by me.    Pulmonary Functions Testing Results:     No data to display          FeNO:   Pathology:   Echocardiogram:   Heart Catheterization:     Assessment & Plan:     ICD-10-CM   1. Multiple pulmonary nodules  R91.8 NM PET Image Initial (PI) Skull Base To Thigh (F-18 FDG)    Ambulatory referral to Pulmonology    Procedural/ Surgical Case Request: ROBOTIC ASSISTED NAVIGATIONAL BRONCHOSCOPY    Novel Coronavirus, NAA ([facility])    2. Former smoker  Z87.891 NM PET Image Initial (PI) Skull Base To Thigh (F-18 FDG)    Ambulatory referral to Pulmonology    Procedural/ Surgical Case Request: ROBOTIC ASSISTED NAVIGATIONAL BRONCHOSCOPY    3. History of COVID-19  Z86.16       Discussion: This is an 81 year old female with incidentally found upper lobe pulmonary nodule on the right side as well as a lower lobe nodule on the left.  She had initial CT imaging in October which showed groundglass peripheral infiltrates that was consistent with her recent diagnosis of COVID-19.  She is a former smoker quit several years ago.  Plan: The size of lesion in the right upper lobe is concerning for lobulated margins.  I did explain that we are likely dealing with a potential underlying malignancy. I talked about all of the various options to include ongoing restratification with nuclear medicine pet imaging versus conservative CT image follow-up. Due to the size of the lesion and probability with [facility] calculator puts the lesion at approximately 60% risk of being malignant. I explained the PET scan would help with service [place] in altering the probability of malignancy within the lesion. However due to the lesion size we could consider biopsy based on that alone. She is agreeable to this plan. We will tentatively get PET scan complete prior to bronchoscopy. Tentative  bronchoscopy date will be on May 12, 2022.  We appreciate P[facility]'s help with scheduling.    Current Outpatient Medications:    alendronate (FOSAMAX) 70 MG tablet, Take 70 mg by mouth once a week. Take with a full glass of water on an empty stomach., Disp: , Rfl:    amLODipine-valsartan (EXFORGE) 5-160 MG tablet, Take 1 tablet by mouth daily., Disp: , Rfl:    aspirin EC 81 MG tablet, Take 81 mg by mouth 3 (three) times a week. Monday, Wednesday, Friday., Disp: , Rfl:    atorvastatin (LIPITOR) 10 MG tablet, Take 10 mg by mouth every Monday, Wednesday, and Friday., Disp: , Rfl:    azelastine (OPTIVAR) 0.05 % ophthalmic solution, Place 1 drop into  both eyes 2 (two) times daily as needed (for allergies)., Disp: , Rfl:    BIOTIN FORTE PO, Take by mouth daily., Disp: , Rfl:    buPROPion (WELLBUTRIN XL) 150 MG 24 hr tablet, Take 150 mg by mouth daily., Disp: , Rfl:    cefdinir (OMNICEF) 300 MG capsule, Take 1 capsule (300 mg total) by mouth every 12 (twelve) hours., Disp: 4 capsule, Rfl: 0   Cholecalciferol (VITAMIN D3) 50 MCG (2000 UT) TABS, Take by mouth., Disp: , Rfl:    donepezil (ARICEPT) 5 MG tablet, Take 1 tablet (5 mg total) by mouth at bedtime., Disp: 90 tablet, Rfl: 3   famotidine (PEPCID) 20 MG tablet, Take 20 mg by mouth daily as needed for heartburn., Disp: , Rfl:    glimepiride (AMARYL) 2 MG tablet, Take 1 tablet (2 mg total) by mouth 2 (two) times daily., Disp: , Rfl:    loratadine (CLARITIN) 10 MG tablet, Take 10 mg by mouth daily as needed for allergies (congestion). , Disp: , Rfl:    MELATONIN PO, Take 1 tablet by mouth at bedtime as needed (for sleep)., Disp: , Rfl:    memantine (NAMENDA) 10 MG tablet, Take 1 tablet (10 mg total) by mouth 2 (two) times daily., Disp: 180 tablet, Rfl: 3   predniSONE (DELTASONE) 20 MG tablet, Take 2 tablets (40 mg total) by mouth daily with breakfast., Disp: 4 tablet, Rfl: 0   primidone (MYSOLINE) 50 MG tablet, Take 2 tablets (100 mg total) by mouth in  the morning and at bedtime. Indication: essential tremors, Disp: 360 tablet, Rfl: 4   sertraline (ZOLOFT) 25 MG tablet, Take 25 mg by mouth daily., Disp: , Rfl:    sulindac (CLINORIL) 150 MG tablet, Take 150 mg by mouth daily as needed (for pain)., Disp: , Rfl:    traZODone (DESYREL) 50 MG tablet, Take 50-100 mg by mouth at bedtime as needed for sleep., Disp: , Rfl:    TRESIBA FLEXTOUCH 100 UNIT/ML SOPN FlexTouch Pen, Inject 25-50 Units into the skin daily., Disp: , Rfl: 6  I spent 62 minutes dedicated to the care of this patient on the date of this encounter to include pre-visit review of records, face-to-face time with the patient discussing conditions above, post visit ordering of testing, clinical documentation with the electronic health record, making appropriate referrals as documented, and communicating necessary findings to members of the patients care team.   Garner Nash, DO [facility] 04/17/2022 9:04 AM

## 2022-04-16 NOTE — H&P (View-Only) (Signed)
 Synopsis: Referred in Feb 2024 for abnormal CT chest by Henderson, Jonathan A, *  Subjective:   PATIENT ID: Rebecca Flores GENDER: female DOB: 12/08/1940, MRN: [id number]  Chief Complaint  Patient presents with   Consult    Lung nodule.    This is an 82-year-old female, past medical history of diabetes, gastroesophageal reflux, hypercholesterolemia, hypertension, tobacco use.  Patient is a former smoker quit in March 2011.Patient was referred after having CT imaging of the chest completed on 03/31/2022.  Patient had some areas of groundglass within the chest and some nodularity.  Most notable was a 1.8 x 1.3 cm nodule in the right upper lobe which looks very similar to his previous study in October 2023.  There was also a pleural-based nodule in the left upper lobe.  And a right lower lobe 1 cm nodule that looked like it had partially resolved favoring inflammation.  Patient from respiratory stance has no complaints today.  She is very anxious about the findings on CT.  Her CT scan does show significant proved from October but in October she was diagnosed with COVID-19.    Past Medical History:  Diagnosis Date   Bradycardia    Colon polyp    Diabetes mellitus    Family history of anesthesia complication    sister hard to wake up   GERD (gastroesophageal reflux disease)    Hypercholesteremia    Hypertension    Kidney stone    Movement disorder    Osteoporosis    Peripheral neuropathy    Seasonal allergies    Sepsis (HCC)    Tobacco use    Tremors of nervous system      Family History  Problem Relation Age of Onset   Dementia Mother    Diabetes Mellitus II Father    Prostate cancer Father    Dementia Sister    Breast cancer Sister    Breast cancer Daughter      Past Surgical History:  Procedure Laterality Date   ABDOMINAL HYSTERECTOMY     APPENDECTOMY     bladder tacking     CHOLECYSTECTOMY  1998   COLONOSCOPY     HERNIA REPAIR  05/2013   INGUINAL HERNIA  REPAIR Right 06/27/2013   Procedure: RIGHT INGUINAL HERNIA REPAIR WITH MESH;  Surgeon: Todd M Gerkin, MD;  Location: [facility];  Service: General;  Laterality: Right;   INSERTION OF MESH Right 06/27/2013   Procedure: INSERTION OF MESH;  Surgeon: Todd M Gerkin, MD;  Location: [facility];  Service: General;  Laterality: Right;   MOUTH SURGERY     TONSILLECTOMY      Social History   Socioeconomic History   Marital status: Widowed    Spouse name: Not on file   Number of children: 2   Years of education: 9TH   Highest education level: Not on file  Occupational History    Employer: RETIRED  Tobacco Use   Smoking status: Former    Types: Cigarettes    Quit date: 04/23/2009    Years since quitting: 12.9   Smokeless tobacco: Never  Substance and Sexual Activity   Alcohol use: No    Alcohol/week: 1.0 standard drink of alcohol    Types: 1 Glasses of wine per week   Drug use: No   Sexual activity: Never  Other Topics Concern   Not on file  Social History Narrative   Patient is widowed with 2 children   09/07/16 Lives alone     Patient has a 9th grade education   Patient is right handed   Patient does not drink caffeine   Social Determinants of Health   Financial Resource Strain: Not on file  Food Insecurity: Unknown (11/25/2021)   Hunger Vital Sign    Worried About Running Out of Food in the Last Year: Patient refused    Ran Out of Food in the Last Year: Patient refused  Transportation Needs: No Transportation Needs (11/25/2021)   PRAPARE - Transportation    Lack of Transportation (Medical): No    Lack of Transportation (Non-Medical): No  Physical Activity: Not on file  Stress: Not on file  Social Connections: Not on file  Intimate Partner Violence: Not At Risk (11/25/2021)   Humiliation, Afraid, Rape, and Kick questionnaire    Fear of Current or Ex-Partner: No    Emotionally Abused: No    Physically Abused: No    Sexually Abused: No     Allergies   Allergen Reactions   Nsaids     Severe facial swelling/rash    Other Rash    Seldane   Atenolol     Hair loss    Codeine Nausea Only    Nausea    Lyrica [Pregabalin]     Constipation, swelling   Primidone     Emotional lability    Simvastatin     Leg cramps   Sulfa Antibiotics     Pruritis       Outpatient Medications Prior to Visit  Medication Sig Dispense Refill   alendronate (FOSAMAX) 70 MG tablet Take 70 mg by mouth once a week. Take with a full glass of water on an empty stomach.     amLODipine-valsartan (EXFORGE) 5-160 MG tablet Take 1 tablet by mouth daily.     aspirin EC 81 MG tablet Take 81 mg by mouth 3 (three) times a week. Monday, Wednesday, Friday.     atorvastatin (LIPITOR) 10 MG tablet Take 10 mg by mouth every Monday, Wednesday, and Friday.     azelastine (OPTIVAR) 0.05 % ophthalmic solution Place 1 drop into both eyes 2 (two) times daily as needed (for allergies).     BIOTIN FORTE PO Take by mouth daily.     buPROPion (WELLBUTRIN XL) 150 MG 24 hr tablet Take 150 mg by mouth daily.     cefdinir (OMNICEF) 300 MG capsule Take 1 capsule (300 mg total) by mouth every 12 (twelve) hours. 4 capsule 0   Cholecalciferol (VITAMIN D3) 50 MCG (2000 UT) TABS Take by mouth.     donepezil (ARICEPT) 5 MG tablet Take 1 tablet (5 mg total) by mouth at bedtime. 90 tablet 3   famotidine (PEPCID) 20 MG tablet Take 20 mg by mouth daily as needed for heartburn.     glimepiride (AMARYL) 2 MG tablet Take 1 tablet (2 mg total) by mouth 2 (two) times daily.     loratadine (CLARITIN) 10 MG tablet Take 10 mg by mouth daily as needed for allergies (congestion).      MELATONIN PO Take 1 tablet by mouth at bedtime as needed (for sleep).     memantine (NAMENDA) 10 MG tablet Take 1 tablet (10 mg total) by mouth 2 (two) times daily. 180 tablet 3   predniSONE (DELTASONE) 20 MG tablet Take 2 tablets (40 mg total) by mouth daily with breakfast. 4 tablet 0   primidone (MYSOLINE) 50 MG tablet  Take 2 tablets (100 mg total) by mouth in the morning and at bedtime. Indication: essential   tremors 360 tablet 4   sertraline (ZOLOFT) 25 MG tablet Take 25 mg by mouth daily.     sulindac (CLINORIL) 150 MG tablet Take 150 mg by mouth daily as needed (for pain).     traZODone (DESYREL) 50 MG tablet Take 50-100 mg by mouth at bedtime as needed for sleep.     TRESIBA FLEXTOUCH 100 UNIT/ML SOPN FlexTouch Pen Inject 25-50 Units into the skin daily.  6   No facility-administered medications prior to visit.    Review of Systems  Constitutional:  Negative for chills, fever, malaise/fatigue and weight loss.  HENT:  Negative for hearing loss, sore throat and tinnitus.   Eyes:  Negative for blurred vision and double vision.  Respiratory:  Negative for cough, hemoptysis, sputum production, shortness of breath, wheezing and stridor.   Cardiovascular:  Negative for chest pain, palpitations, orthopnea, leg swelling and PND.  Gastrointestinal:  Negative for abdominal pain, constipation, diarrhea, heartburn, nausea and vomiting.  Genitourinary:  Negative for dysuria, hematuria and urgency.  Musculoskeletal:  Negative for joint pain and myalgias.  Skin:  Negative for itching and rash.  Neurological:  Negative for dizziness, tingling, weakness and headaches.  Endo/Heme/Allergies:  Negative for environmental allergies. Does not bruise/bleed easily.  Psychiatric/Behavioral:  Negative for depression. The patient is not nervous/anxious and does not have insomnia.   All other systems reviewed and are negative.    Objective:  Physical Exam Vitals reviewed.  Constitutional:      General: She is not in acute distress.    Appearance: She is well-developed.  HENT:     Head: Normocephalic and atraumatic.  Eyes:     General: No scleral icterus.    Conjunctiva/sclera: Conjunctivae normal.     Pupils: Pupils are equal, round, and reactive to light.  Neck:     Vascular: No JVD.     Trachea: No tracheal  deviation.  Cardiovascular:     Rate and Rhythm: Normal rate and regular rhythm.     Heart sounds: Normal heart sounds. No murmur heard. Pulmonary:     Effort: Pulmonary effort is normal. No tachypnea, accessory muscle usage or respiratory distress.     Breath sounds: No stridor. No wheezing, rhonchi or rales.  Abdominal:     General: There is no distension.     Palpations: Abdomen is soft.     Tenderness: There is no abdominal tenderness.  Musculoskeletal:        General: No tenderness.     Cervical back: Neck supple.  Lymphadenopathy:     Cervical: No cervical adenopathy.  Skin:    General: Skin is warm and dry.     Capillary Refill: Capillary refill takes less than 2 seconds.     Findings: No rash.  Neurological:     Mental Status: She is alert and oriented to person, place, and time.  Psychiatric:        Behavior: Behavior normal.      Vitals:   04/17/22 0830  BP: (!) 140/60  Pulse: (!) 51  SpO2: 96%  Weight: 151 lb 3.2 oz (68.6 kg)  Height: 5' 5" (1.651 m)   96% on RA BMI Readings from Last 3 Encounters:  04/17/22 25.16 kg/m  10/08/21 25.71 kg/m  04/08/21 26.63 kg/m   Wt Readings from Last 3 Encounters:  04/17/22 151 lb 3.2 oz (68.6 kg)  10/08/21 154 lb 8 oz (70.1 kg)  04/08/21 160 lb (72.6 kg)     CBC    Component Value Date/Time     WBC 7.1 11/25/2021 0359   RBC 3.40 (L) 11/25/2021 0359   HGB 10.0 (L) 11/25/2021 0359   HGB 12.0 06/05/2020 1404   HCT 30.1 (L) 11/25/2021 0359   HCT 35.4 06/05/2020 1404   PLT 214 11/25/2021 0359   PLT 220 06/05/2020 1404   MCV 88.5 11/25/2021 0359   MCV 88 06/05/2020 1404   MCH 29.4 11/25/2021 0359   MCHC 33.2 11/25/2021 0359   RDW 12.3 11/25/2021 0359   RDW 12.8 06/05/2020 1404   LYMPHSABS 2.8 06/05/2020 1404   MONOABS 1.8 (H) 06/13/2014 0848   EOSABS 0.2 06/05/2020 1404   BASOSABS 0.1 06/05/2020 1404     Chest Imaging: 03/31/2022 CT chest: Multiple small areas of nodularity areas of groundglass within  the chest. Possible underlying malignancy versus inflammatory lesions. The patient's images have been independently reviewed by me.    Pulmonary Functions Testing Results:     No data to display          FeNO:   Pathology:   Echocardiogram:   Heart Catheterization:     Assessment & Plan:     ICD-10-CM   1. Multiple pulmonary nodules  R91.8 NM PET Image Initial (PI) Skull Base To Thigh (F-18 FDG)    Ambulatory referral to Pulmonology    Procedural/ Surgical Case Request: ROBOTIC ASSISTED NAVIGATIONAL BRONCHOSCOPY    Novel Coronavirus, NAA ([facility])    2. Former smoker  Z87.891 NM PET Image Initial (PI) Skull Base To Thigh (F-18 FDG)    Ambulatory referral to Pulmonology    Procedural/ Surgical Case Request: ROBOTIC ASSISTED NAVIGATIONAL BRONCHOSCOPY    3. History of COVID-19  Z86.16       Discussion: This is an 81-year-old female with incidentally found upper lobe pulmonary nodule on the right side as well as a lower lobe nodule on the left.  She had initial CT imaging in October which showed groundglass peripheral infiltrates that was consistent with her recent diagnosis of COVID-19.  She is a former smoker quit several years ago.  Plan: The size of lesion in the right upper lobe is concerning for lobulated margins.  I did explain that we are likely dealing with a potential underlying malignancy. I talked about all of the various options to include ongoing restratification with nuclear medicine pet imaging versus conservative CT image follow-up. Due to the size of the lesion and probability with [facility] calculator puts the lesion at approximately 60% risk of being malignant. I explained the PET scan would help with service us in altering the probability of malignancy within the lesion. However due to the lesion size we could consider biopsy based on that alone. She is agreeable to this plan. We will tentatively get PET scan complete prior to bronchoscopy. Tentative  bronchoscopy date will be on May 12, 2022.  We appreciate PCC's help with scheduling.    Current Outpatient Medications:    alendronate (FOSAMAX) 70 MG tablet, Take 70 mg by mouth once a week. Take with a full glass of water on an empty stomach., Disp: , Rfl:    amLODipine-valsartan (EXFORGE) 5-160 MG tablet, Take 1 tablet by mouth daily., Disp: , Rfl:    aspirin EC 81 MG tablet, Take 81 mg by mouth 3 (three) times a week. Monday, Wednesday, Friday., Disp: , Rfl:    atorvastatin (LIPITOR) 10 MG tablet, Take 10 mg by mouth every Monday, Wednesday, and Friday., Disp: , Rfl:    azelastine (OPTIVAR) 0.05 % ophthalmic solution, Place 1 drop into   both eyes 2 (two) times daily as needed (for allergies)., Disp: , Rfl:    BIOTIN FORTE PO, Take by mouth daily., Disp: , Rfl:    buPROPion (WELLBUTRIN XL) 150 MG 24 hr tablet, Take 150 mg by mouth daily., Disp: , Rfl:    cefdinir (OMNICEF) 300 MG capsule, Take 1 capsule (300 mg total) by mouth every 12 (twelve) hours., Disp: 4 capsule, Rfl: 0   Cholecalciferol (VITAMIN D3) 50 MCG (2000 UT) TABS, Take by mouth., Disp: , Rfl:    donepezil (ARICEPT) 5 MG tablet, Take 1 tablet (5 mg total) by mouth at bedtime., Disp: 90 tablet, Rfl: 3   famotidine (PEPCID) 20 MG tablet, Take 20 mg by mouth daily as needed for heartburn., Disp: , Rfl:    glimepiride (AMARYL) 2 MG tablet, Take 1 tablet (2 mg total) by mouth 2 (two) times daily., Disp: , Rfl:    loratadine (CLARITIN) 10 MG tablet, Take 10 mg by mouth daily as needed for allergies (congestion). , Disp: , Rfl:    MELATONIN PO, Take 1 tablet by mouth at bedtime as needed (for sleep)., Disp: , Rfl:    memantine (NAMENDA) 10 MG tablet, Take 1 tablet (10 mg total) by mouth 2 (two) times daily., Disp: 180 tablet, Rfl: 3   predniSONE (DELTASONE) 20 MG tablet, Take 2 tablets (40 mg total) by mouth daily with breakfast., Disp: 4 tablet, Rfl: 0   primidone (MYSOLINE) 50 MG tablet, Take 2 tablets (100 mg total) by mouth in  the morning and at bedtime. Indication: essential tremors, Disp: 360 tablet, Rfl: 4   sertraline (ZOLOFT) 25 MG tablet, Take 25 mg by mouth daily., Disp: , Rfl:    sulindac (CLINORIL) 150 MG tablet, Take 150 mg by mouth daily as needed (for pain)., Disp: , Rfl:    traZODone (DESYREL) 50 MG tablet, Take 50-100 mg by mouth at bedtime as needed for sleep., Disp: , Rfl:    TRESIBA FLEXTOUCH 100 UNIT/ML SOPN FlexTouch Pen, Inject 25-50 Units into the skin daily., Disp: , Rfl: 6  I spent 62 minutes dedicated to the care of this patient on the date of this encounter to include pre-visit review of records, face-to-face time with the patient discussing conditions above, post visit ordering of testing, clinical documentation with the electronic health record, making appropriate referrals as documented, and communicating necessary findings to members of the patients care team.   Brynnleigh Mcelwee L Arihaan Bellucci, DO [facility] 04/17/2022 9:04 AM    

## 2022-04-17 ENCOUNTER — Encounter: Payer: Self-pay | Admitting: Pulmonary Disease

## 2022-04-17 ENCOUNTER — Ambulatory Visit: Payer: Medicare Other | Admitting: Pulmonary Disease

## 2022-04-17 VITALS — BP 140/60 | HR 51 | Ht 65.0 in | Wt 151.2 lb

## 2022-04-17 DIAGNOSIS — Z87891 Personal history of nicotine dependence: Secondary | ICD-10-CM | POA: Diagnosis not present

## 2022-04-17 DIAGNOSIS — Z8616 Personal history of COVID-19: Secondary | ICD-10-CM | POA: Diagnosis not present

## 2022-04-17 DIAGNOSIS — R918 Other nonspecific abnormal finding of lung field: Secondary | ICD-10-CM

## 2022-04-17 NOTE — Patient Instructions (Signed)
Thank you for visiting Dr. Valeta Harms at [facility]. Today we recommend the following:  Orders Placed This Encounter  Procedures   Procedural/ Surgical Case Request: ROBOTIC ASSISTED NAVIGATIONAL BRONCHOSCOPY   Novel Coronavirus, NAA ([facility])   NM PET Image Initial (PI) Skull Base To Thigh (F-18 FDG)   Ambulatory referral to Pulmonology   Bronchoscopy on 05/12/2022  Return in about 1 month (around 05/19/2022) for w/ Eric Form, NP .    Please do your part to reduce the spread of COVID-19.

## 2022-05-08 ENCOUNTER — Encounter (HOSPITAL_COMMUNITY)
Admission: RE | Admit: 2022-05-08 | Discharge: 2022-05-08 | Disposition: A | Discharge status: Home / Self Care | Payer: Medicare Other | Source: Ambulatory Visit | Attending: Pulmonary Disease | Admitting: Pulmonary Disease

## 2022-05-08 ENCOUNTER — Other Ambulatory Visit: Payer: Medicare Other

## 2022-05-08 DIAGNOSIS — Z87891 Personal history of nicotine dependence: Secondary | ICD-10-CM | POA: Insufficient documentation

## 2022-05-08 DIAGNOSIS — R918 Other nonspecific abnormal finding of lung field: Secondary | ICD-10-CM | POA: Diagnosis present

## 2022-05-08 LAB — GLUCOSE, CAPILLARY: Glucose-Capillary: 69 mg/dL — ABNORMAL LOW (ref 70–99)

## 2022-05-08 MED ORDER — FLUDEOXYGLUCOSE F - 18 (FDG) INJECTION
7.6000 | Freq: Once | INTRAVENOUS | Status: AC
  Administered 2022-05-08: 7.55 via INTRAVENOUS

## 2022-05-10 LAB — SPECIMEN STATUS REPORT

## 2022-05-10 LAB — NOVEL CORONAVIRUS, NAA: SARS-CoV-2, NAA: NOT DETECTED

## 2022-05-11 ENCOUNTER — Encounter (HOSPITAL_COMMUNITY): Payer: Self-pay | Admitting: Pulmonary Disease

## 2022-05-11 ENCOUNTER — Other Ambulatory Visit: Payer: Self-pay

## 2022-05-11 ENCOUNTER — Encounter: Payer: Self-pay | Admitting: Pulmonary Disease

## 2022-05-11 NOTE — Progress Notes (Addendum)
PCP - Dr. Okey Dupre Cardiologist - Denies  PPM/ICD - Denies  Chest x-ray - 11/2021 EKG - 11/2021 Stress Test -  ECHO - 11/2021 Cardiac Cath -   CPAP - Denies  Type II Diabetic Fasting Blood Sugar - 68-86 Checks Blood Sugars    once /day  Blood Thinner Instructions: Denies Aspirin Instructions: Denies  ERAS Protcol - No, NPO  COVID TEST- 05/08/2022 at [facility]  Anesthesia review: No  Patient verbally denies any shortness of breath, fever, cough and chest pain during phone call   -------------  SDW INSTRUCTIONS given:  Your procedure is scheduled on Tuesday, May 12, 2022  Report to [facility] Main Entrance "A" at 0830 A.M., and check in at the Admitting office.  Call this number if you have problems the morning of surgery:  [phone]   Remember:  Do not eat or drink after midnight the night before your surgery    Take these medicines the morning of surgery with A SIP OF WATER  Wellbutrin, Namenda, Zoloft, Primidone, Fenobibrite   As needed: Pepcid, claritin   As of today, STOP taking any Aspirin (unless otherwise instructed by your surgeon) Aleve, Naproxen, Ibuprofen, Motrin, Advil, Goody's, BC's, all herbal medications, fish oil, and all vitamins.  WHAT DO I DO ABOUT MY DIABETES MEDICATION?   Do not take oral diabetes medicines (pills) the morning of surgery.  THE Morning BEFORE SURGERY, take 33 units of Tresiba Flextouch insulin.       THE MORNING OF SURGERY, take 16.5 units of Tresiba Flextouch insulin. Do NOT take if BS is low  The day of surgery, do not take other diabetes injectables, including Byetta (exenatide), Bydureon (exenatide ER), Victoza (liraglutide), or Trulicity (dulaglutide).  If your CBG is greater than 220 mg/dL, you may take  of your sliding scale (correction) dose of insulin.   HOW TO MANAGE YOUR DIABETES BEFORE AND AFTER SURGERY  Why is it important to control my blood sugar before and after  surgery? Improving blood sugar levels before and after surgery helps healing and can limit problems. A way of improving blood sugar control is eating a healthy diet by:  Eating less sugar and carbohydrates  Increasing activity/exercise  Talking with your doctor about reaching your blood sugar goals High blood sugars (greater than 180 mg/dL) can raise your risk of infections and slow your recovery, so you will need to focus on controlling your diabetes during the weeks before surgery. Make sure that the doctor who takes care of your diabetes knows about your planned surgery including the date and location.  How do I manage my blood sugar before surgery? Check your blood sugar at least 4 times a day, starting 2 days before surgery, to make sure that the level is not too high or low.  Check your blood sugar the morning of your surgery when you wake up and every 2 hours until you get to the Short Stay unit.  If your blood sugar is less than 70 mg/dL, you will need to treat for low blood sugar: Do not take insulin. Treat a low blood sugar (less than 70 mg/dL) with  cup of clear juice (cranberry or apple), 4 glucose tablets, OR glucose gel. Recheck blood sugar in 15 minutes after treatment (to make sure it is greater than 70 mg/dL). If your blood sugar is not greater than 70 mg/dL on recheck, call [phone] for further instructions. Report your blood sugar to the short stay nurse when you get to Short  Stay.  If you are admitted to the hospital after surgery: Your blood sugar will be checked by the staff and you will probably be given insulin after surgery (instead of oral diabetes medicines) to make sure you have good blood sugar levels. The goal for blood sugar control after surgery is 80-180 mg/dL.             Do NOT Smoke (Tobacco/Vaping) 24 hours prior to your procedure If you use a CPAP at night, you may bring all equipment for your overnight stay.   Contacts, glasses, dentures or  bridgework may not be worn into surgery.      For patients admitted to the hospital, discharge time will be determined by your treatment team.   Patients discharged the day of surgery will not be allowed to drive home, and someone needs to stay with them for 24 hours.    Special instructions:   [place]- Preparing For Surgery  Before surgery, you can play an important role. Because skin is not sterile, your skin needs to be as free of germs as possible. You can reduce the number of germs on your skin by washing with Dial Soap before surgery.    Oral Hygiene is also important to reduce your risk of infection.  Remember - BRUSH YOUR TEETH THE MORNING OF SURGERY WITH YOUR REGULAR TOOTHPASTE  Please follow these instructions carefully.   Shower the NIGHT BEFORE SURGERY and the MORNING OF SURGERY with DIAL Soap.   Pat yourself dry with a CLEAN TOWEL.  Wear CLEAN PAJAMAS to bed the night before surgery  Place CLEAN SHEETS on your bed the night of your first shower and DO NOT SLEEP WITH PETS.   Day of Surgery: Please shower morning of surgery  Wear Clean/Comfortable clothing the morning of surgery Do not apply any deodorants/lotions, powders or perfuses. .   Do not wear jewelry, make up or nail polish Remember to brush your teeth WITH YOUR REGULAR TOOTHPASTE.  Do not bring valuables to the hospital. [facility] is not responsible for any belongings or valuables.    Questions were answered. Patient verbalized understanding of instructions.

## 2022-05-12 ENCOUNTER — Encounter (HOSPITAL_COMMUNITY)
Admission: RE | Disposition: A | Discharge status: Home / Self Care | Payer: Self-pay | Source: Home / Self Care | Attending: Pulmonary Disease

## 2022-05-12 ENCOUNTER — Ambulatory Visit (HOSPITAL_COMMUNITY): Payer: Medicare Other | Admitting: Physician Assistant

## 2022-05-12 ENCOUNTER — Ambulatory Visit (HOSPITAL_COMMUNITY)
Admission: RE | Admit: 2022-05-12 | Discharge: 2022-05-12 | Disposition: A | Discharge status: Home / Self Care | Payer: Medicare Other | Attending: Pulmonary Disease | Admitting: Pulmonary Disease

## 2022-05-12 ENCOUNTER — Ambulatory Visit (HOSPITAL_BASED_OUTPATIENT_CLINIC_OR_DEPARTMENT_OTHER): Payer: Medicare Other | Admitting: Physician Assistant

## 2022-05-12 ENCOUNTER — Encounter (HOSPITAL_COMMUNITY): Payer: Self-pay | Admitting: Pulmonary Disease

## 2022-05-12 ENCOUNTER — Ambulatory Visit (HOSPITAL_COMMUNITY): Payer: Medicare Other

## 2022-05-12 ENCOUNTER — Other Ambulatory Visit: Payer: Self-pay

## 2022-05-12 DIAGNOSIS — K219 Gastro-esophageal reflux disease without esophagitis: Secondary | ICD-10-CM | POA: Diagnosis not present

## 2022-05-12 DIAGNOSIS — Z794 Long term (current) use of insulin: Secondary | ICD-10-CM | POA: Diagnosis not present

## 2022-05-12 DIAGNOSIS — Z79899 Other long term (current) drug therapy: Secondary | ICD-10-CM | POA: Diagnosis not present

## 2022-05-12 DIAGNOSIS — E119 Type 2 diabetes mellitus without complications: Secondary | ICD-10-CM | POA: Insufficient documentation

## 2022-05-12 DIAGNOSIS — R918 Other nonspecific abnormal finding of lung field: Secondary | ICD-10-CM | POA: Diagnosis not present

## 2022-05-12 DIAGNOSIS — R599 Enlarged lymph nodes, unspecified: Secondary | ICD-10-CM

## 2022-05-12 DIAGNOSIS — I10 Essential (primary) hypertension: Secondary | ICD-10-CM | POA: Insufficient documentation

## 2022-05-12 DIAGNOSIS — Z7984 Long term (current) use of oral hypoglycemic drugs: Secondary | ICD-10-CM | POA: Insufficient documentation

## 2022-05-12 DIAGNOSIS — Z87891 Personal history of nicotine dependence: Secondary | ICD-10-CM | POA: Diagnosis not present

## 2022-05-12 DIAGNOSIS — F32A Depression, unspecified: Secondary | ICD-10-CM | POA: Insufficient documentation

## 2022-05-12 DIAGNOSIS — Z8616 Personal history of COVID-19: Secondary | ICD-10-CM | POA: Diagnosis not present

## 2022-05-12 DIAGNOSIS — E7800 Pure hypercholesterolemia, unspecified: Secondary | ICD-10-CM | POA: Insufficient documentation

## 2022-05-12 DIAGNOSIS — R911 Solitary pulmonary nodule: Secondary | ICD-10-CM | POA: Insufficient documentation

## 2022-05-12 HISTORY — PX: FINE NEEDLE ASPIRATION: SHX5430

## 2022-05-12 HISTORY — PX: ENDOBRONCHIAL ULTRASOUND: SHX5096

## 2022-05-12 HISTORY — PX: BRONCHIAL NEEDLE ASPIRATION BIOPSY: SHX5106

## 2022-05-12 HISTORY — PX: BRONCHIAL BIOPSY: SHX5109

## 2022-05-12 HISTORY — PX: BRONCHIAL BRUSHINGS: SHX5108

## 2022-05-12 LAB — CBC
HCT: 38.1 % (ref 36.0–46.0)
Hemoglobin: 12.2 g/dL (ref 12.0–15.0)
MCH: 28.3 pg (ref 26.0–34.0)
MCHC: 32 g/dL (ref 30.0–36.0)
MCV: 88.4 fL (ref 80.0–100.0)
Platelets: 246 10*3/uL (ref 150–400)
RBC: 4.31 MIL/uL (ref 3.87–5.11)
RDW: 12.8 % (ref 11.5–15.5)
WBC: 7.8 10*3/uL (ref 4.0–10.5)
nRBC: 0 % (ref 0.0–0.2)

## 2022-05-12 LAB — BASIC METABOLIC PANEL
Anion gap: 9 (ref 5–15)
BUN: 15 mg/dL (ref 8–23)
CO2: 21 mmol/L — ABNORMAL LOW (ref 22–32)
Calcium: 9.2 mg/dL (ref 8.9–10.3)
Chloride: 107 mmol/L (ref 98–111)
Creatinine, Ser: 1.07 mg/dL — ABNORMAL HIGH (ref 0.44–1.00)
GFR, Estimated: 52 mL/min — ABNORMAL LOW (ref >60)
Glucose, Bld: 92 mg/dL (ref 70–99)
Potassium: 3.9 mmol/L (ref 3.5–5.1)
Sodium: 137 mmol/L (ref 135–145)

## 2022-05-12 LAB — GLUCOSE, CAPILLARY
Glucose-Capillary: 92 mg/dL (ref 70–99)
Glucose-Capillary: 75 mg/dL (ref 70–99)
Glucose-Capillary: 126 mg/dL — ABNORMAL HIGH (ref 70–99)
Glucose-Capillary: 69 mg/dL — ABNORMAL LOW (ref 70–99)
Glucose-Capillary: 77 mg/dL (ref 70–99)
Glucose-Capillary: 73 mg/dL (ref 70–99)
Glucose-Capillary: 84 mg/dL (ref 70–99)

## 2022-05-12 SURGERY — BRONCHOSCOPY, WITH BIOPSY USING ELECTROMAGNETIC NAVIGATION
Anesthesia: General | Laterality: Bilateral

## 2022-05-12 MED ORDER — CHLORHEXIDINE GLUCONATE 0.12 % MT SOLN
15.0000 mL | Freq: Once | OROMUCOSAL | Status: AC
  Administered 2022-05-12: 15 mL via OROMUCOSAL
  Filled 2022-05-12: qty 15

## 2022-05-12 MED ORDER — OXYCODONE HCL 5 MG PO TABS: 5.0000 mg | ORAL_TABLET | Freq: Once | ORAL | Status: DC | PRN

## 2022-05-12 MED ORDER — INSULIN ASPART 100 UNIT/ML IJ SOLN: 0.0000 [IU] | INTRAMUSCULAR | Status: DC | PRN

## 2022-05-12 MED ORDER — PROMETHAZINE HCL 25 MG/ML IJ SOLN: 6.2500 mg | INTRAMUSCULAR | Status: DC | PRN

## 2022-05-12 MED ORDER — OXYCODONE HCL 5 MG/5ML PO SOLN: 5.0000 mg | Freq: Once | ORAL | Status: DC | PRN

## 2022-05-12 MED ORDER — PROPOFOL 10 MG/ML IV BOLUS
INTRAVENOUS | Status: DC | PRN
  Administered 2022-05-12: 120 mg via INTRAVENOUS

## 2022-05-12 MED ORDER — DEXAMETHASONE SODIUM PHOSPHATE 10 MG/ML IJ SOLN
INTRAMUSCULAR | Status: DC | PRN
  Administered 2022-05-12: 5 mg via INTRAVENOUS

## 2022-05-12 MED ORDER — ROCURONIUM BROMIDE 10 MG/ML (PF) SYRINGE
PREFILLED_SYRINGE | INTRAVENOUS | Status: DC | PRN
  Administered 2022-05-12: 50 mg via INTRAVENOUS

## 2022-05-12 MED ORDER — ONDANSETRON HCL 4 MG/2ML IJ SOLN
INTRAMUSCULAR | Status: DC | PRN
  Administered 2022-05-12: 4 mg via INTRAVENOUS

## 2022-05-12 MED ORDER — FENTANYL CITRATE (PF) 250 MCG/5ML IJ SOLN
INTRAMUSCULAR | Status: DC | PRN
  Administered 2022-05-12 (×2): 50 ug via INTRAVENOUS

## 2022-05-12 MED ORDER — SUGAMMADEX SODIUM 200 MG/2ML IV SOLN
INTRAVENOUS | Status: DC | PRN
  Administered 2022-05-12: 200 mg via INTRAVENOUS

## 2022-05-12 MED ORDER — HYDROMORPHONE HCL 1 MG/ML IJ SOLN: 0.2500 mg | INTRAMUSCULAR | Status: DC | PRN

## 2022-05-12 MED ORDER — AMISULPRIDE (ANTIEMETIC) 5 MG/2ML IV SOLN: 10.0000 mg | Freq: Once | INTRAVENOUS | Status: DC | PRN

## 2022-05-12 MED ORDER — DEXTROSE 50 % IV SOLN
INTRAVENOUS | Status: AC
  Administered 2022-05-12: 12.5 g via INTRAVENOUS
  Filled 2022-05-12: qty 50

## 2022-05-12 MED ORDER — EPHEDRINE SULFATE-NACL 50-0.9 MG/10ML-% IV SOSY
PREFILLED_SYRINGE | INTRAVENOUS | Status: DC | PRN
  Administered 2022-05-12 (×2): 10 mg via INTRAVENOUS

## 2022-05-12 MED ORDER — LACTATED RINGERS IV SOLN: INTRAVENOUS | Status: DC

## 2022-05-12 MED ORDER — GLYCOPYRROLATE 0.2 MG/ML IJ SOLN
INTRAMUSCULAR | Status: DC | PRN
  Administered 2022-05-12: .2 mg via INTRAVENOUS

## 2022-05-12 MED ORDER — DEXTROSE 50 % IV SOLN: 12.5000 g | INTRAVENOUS | Status: AC

## 2022-05-12 NOTE — Anesthesia Postprocedure Evaluation (Signed)
Anesthesia Post Note  Patient: Lien [employer]  Procedure(s) Performed: ROBOTIC ASSISTED NAVIGATIONAL BRONCHOSCOPY (Bilateral) BRONCHIAL NEEDLE ASPIRATION BIOPSIES BRONCHIAL BRUSHINGS BRONCHIAL BIOPSIES ENDOBRONCHIAL ULTRASOUND (Bilateral) FINE NEEDLE ASPIRATION (FNA) LINEAR     Patient location during evaluation: PACU Anesthesia Type: General Level of consciousness: awake and alert Pain management: pain level controlled Vital Signs Assessment: post-procedure vital signs reviewed and stable Respiratory status: spontaneous breathing, nonlabored ventilation and respiratory function stable Cardiovascular status: blood pressure returned to baseline and stable Postop Assessment: no apparent nausea or vomiting Anesthetic complications: no   No notable events documented.  Last Vitals:  Vitals:   05/12/22 1300 05/12/22 1315  BP: (!) 162/75 (!) 197/71  Pulse: (!) 58 (!) 56  Resp: 18 16  Temp:  36.7 C  SpO2: (!) 89% 100%    Last Pain:  Vitals:   05/12/22 0915  TempSrc:   PainSc: 0-No pain                 Lynda Rainwater

## 2022-05-12 NOTE — Discharge Instructions (Signed)

## 2022-05-12 NOTE — Telephone Encounter (Signed)
Dr. Valeta Harms please advise on pt message. Daughter-in-law, Leana Roe (on [place]), is requesting PET results. Pt has OV on 3/26 with Sarah.

## 2022-05-12 NOTE — Progress Notes (Signed)
Pt. and family Reported pt's blood sugar was 53 this am at 0730 and pt. Drank apple juice. They rechecked her at 0800 and pt. Was 60. Pt. Took her regular insulin dose afterwards.

## 2022-05-12 NOTE — Transfer of Care (Signed)
Immediate Anesthesia Transfer of Care Note  Patient: Rebecca Flores  Procedure(s) Performed: ROBOTIC ASSISTED NAVIGATIONAL BRONCHOSCOPY (Bilateral) BRONCHIAL NEEDLE ASPIRATION BIOPSIES BRONCHIAL BRUSHINGS BRONCHIAL BIOPSIES ENDOBRONCHIAL ULTRASOUND (Bilateral) FINE NEEDLE ASPIRATION (FNA) LINEAR  Patient Location: PACU  Anesthesia Type:General  Level of Consciousness: awake and alert   Airway & Oxygen Therapy: Patient Spontanous Breathing  Post-op Assessment: Report given to RN, Post -op Vital signs reviewed and stable, and Patient moving all extremities X 4  Post vital signs: Reviewed and stable  Last Vitals:  Vitals Value Taken Time  BP 155/56 05/12/22 1221  Temp    Pulse 67 05/12/22 1225  Resp 18 05/12/22 1225  SpO2 94 % 05/12/22 1225  Vitals shown include unvalidated device data.  Last Pain:  Vitals:   05/12/22 0915  TempSrc:   PainSc: 0-No pain         Complications: No notable events documented.

## 2022-05-12 NOTE — Progress Notes (Signed)
Reviewed at bronchoscopy. Spoke with son.  Ordered IR US of axillary node  Thanks,  BLI  Garner Nash, DO [facility] 05/12/2022 2:06 PM

## 2022-05-12 NOTE — Interval H&P Note (Signed)
History and Physical Interval Note:  05/12/2022 10:43 AM  Rebecca Flores  has presented today for surgery, with the diagnosis of lung nodules.  The various methods of treatment have been discussed with the patient and family. After consideration of risks, benefits and other options for treatment, the patient has consented to  Procedure(s): ROBOTIC ASSISTED NAVIGATIONAL BRONCHOSCOPY (Bilateral) as a surgical intervention.  The patient's history has been reviewed, patient examined, no change in status, stable for surgery.  I have reviewed the patient's chart and labs.  Questions were answered to the patient's satisfaction.     [place]

## 2022-05-12 NOTE — Anesthesia Procedure Notes (Signed)
Procedure Name: Intubation Date/Time: 05/12/2022 11:25 AM  Performed by: Mosetta Pigeon, CRNAPre-anesthesia Checklist: Patient identified, Emergency Drugs available, Suction available and Patient being monitored Patient Re-evaluated:Patient Re-evaluated prior to induction Oxygen Delivery Method: Circle System Utilized Preoxygenation: Pre-oxygenation with 100% oxygen Induction Type: IV induction Ventilation: Mask ventilation without difficulty Laryngoscope Size: Mac and 3 Grade View: Grade I Tube type: Oral Tube size: 8.5 mm Number of attempts: 1 Airway Equipment and Method: Stylet and Oral airway Placement Confirmation: ETT inserted through vocal cords under direct vision, positive ETCO2 and breath sounds checked- equal and bilateral Secured at: 23 cm Tube secured with: Tape Dental Injury: Teeth and Oropharynx as per pre-operative assessment

## 2022-05-12 NOTE — Op Note (Signed)
Video Bronchoscopy with Robotic Assisted Bronchoscopic Navigation  Video Bronchoscopy with Endobronchial Ultrasound Procedure Note  Date of Operation: 05/12/2022   Pre-op Diagnosis: Lung nodule   Post-op Diagnosis: Lung nodule  Surgeon: Garner Nash, DO   Assistants: None   Anesthesia: General endotracheal anesthesia  Operation: Flexible video fiberoptic bronchoscopy with robotic assistance and biopsies.  Estimated Blood Loss: Minimal  Complications: None  Indications and History: Rebecca Flores is a 82 y.o. female with history of Lung nodule. The risks, benefits, complications, treatment options and expected outcomes were discussed with the patient.  The possibilities of pneumothorax, pneumonia, reaction to medication, pulmonary aspiration, perforation of a viscus, bleeding, failure to diagnose a condition and creating a complication requiring transfusion or operation were discussed with the patient who freely signed the consent.    Description of Procedure: The patient was seen in the Preoperative Area, was examined and was deemed appropriate to proceed.  The patient was taken to [facility] endoscopy room 3, identified as Rebecca Flores and the procedure verified as Flexible Video Fiberoptic Bronchoscopy.  A Time Out was held and the above information confirmed.   Prior to the date of the procedure a high-resolution CT scan of the chest was performed. Utilizing ION software program a virtual tracheobronchial tree was generated to allow the creation of distinct navigation pathways to the patient's parenchymal abnormalities. After being taken to the operating room general anesthesia was initiated and the patient  was orally intubated. The video fiberoptic bronchoscope was introduced via the endotracheal tube and a general inspection was performed which showed normal right and left lung anatomy, aspiration of the bilateral mainstems was completed to remove any remaining  secretions. Robotic catheter inserted into patient's endotracheal tube.   Target #1 right upper lobe lung nodule: The distinct navigation pathways prepared prior to this procedure were then utilized to navigate to patient's lesion identified on CT scan. The robotic catheter was secured into place and the vision probe was withdrawn.  Lesion location was approximated using fluoroscopy and three-dimensional cone beam CT imaging for CT-guided needle placement and peripheral targeting. Under fluoroscopic guidance transbronchial needle brushings, transbronchial needle biopsies, and transbronchial forceps biopsies were performed to be sent for cytology and pathology.   Target #2 11L: The standard scope was then withdrawn and the endobronchial ultrasound was used to identify and characterize the peritracheal, hilar and bronchial lymph nodes. Inspection showed small enlarged left hilar node, station 11 L. Using real-time ultrasound guidance Wang needle biopsies were take from Station 11 L nodes and were sent for cytology. The patient tolerated the procedure well without apparent complications. There was no significant blood loss. The bronchoscope was withdrawn. Anesthesia was reversed and the patient was taken to the PACU for recovery.   At the end of the procedure a general airway inspection was performed and there was no evidence of active bleeding. The bronchoscope was removed.  The patient tolerated the procedure well. There was no significant blood loss and there were no obvious complications. A post-procedural chest x-ray is pending.  Samples Target #1: 1. Transbronchial needle brushings from right upper lobe 2. Transbronchial Wang needle biopsies from right upper lobe 3. Transbronchial forceps biopsies from right upper lobe  Samples Target #2: 1. Wang needle biopsies from station 11 L node  Plans:  The patient will be discharged from the PACU to home when recovered from anesthesia and after chest  x-ray is reviewed. We will review the cytology, pathology results with the patient when they  become available. Outpatient followup will be with Garner Nash, DO.  Garner Nash, DO [facility] 05/12/2022 12:11 PM

## 2022-05-12 NOTE — Anesthesia Preprocedure Evaluation (Signed)
Anesthesia Evaluation  Patient identified by MRN, date of birth, ID band Patient awake    Reviewed: Allergy & Precautions, H&P , NPO status , Patient's Chart, lab work & pertinent test results  Airway Mallampati: I  TM Distance: >3 FB Neck ROM: Full    Dental  (+) Teeth Intact, Dental Advisory Given   Pulmonary former smoker   breath sounds clear to auscultation       Cardiovascular hypertension, Pt. on medications  Rhythm:Regular Rate:Normal     Neuro/Psych    Depression       GI/Hepatic ,GERD  ,,  Endo/Other  diabetes, Well Controlled, Type 2, Oral Hypoglycemic Agents    Renal/GU      Musculoskeletal   Abdominal   Peds  Hematology   Anesthesia Other Findings   Reproductive/Obstetrics                             Anesthesia Physical Anesthesia Plan  ASA: III  Anesthesia Plan: General   Post-op Pain Management: Minimal or no pain anticipated   Induction: Intravenous  PONV Risk Score and Plan: 3 and Ondansetron, Dexamethasone, Midazolam and Treatment may vary due to age or medical condition  Airway Management Planned: Oral ETT  Additional Equipment:   Intra-op Plan:   Post-operative Plan: Extubation in OR  Informed Consent: I have reviewed the patients History and Physical, chart, labs and discussed the procedure including the risks, benefits and alternatives for the proposed anesthesia with the patient or authorized representative who has indicated his/her understanding and acceptance.     Dental advisory given  Plan Discussed with: CRNA, Anesthesiologist and Surgeon  Anesthesia Plan Comments:         Anesthesia Quick Evaluation

## 2022-05-13 ENCOUNTER — Encounter (HOSPITAL_COMMUNITY): Payer: Self-pay | Admitting: Pulmonary Disease

## 2022-05-13 ENCOUNTER — Other Ambulatory Visit (HOSPITAL_COMMUNITY): Payer: Self-pay | Admitting: Pulmonary Disease

## 2022-05-13 DIAGNOSIS — R590 Localized enlarged lymph nodes: Secondary | ICD-10-CM

## 2022-05-13 DIAGNOSIS — R911 Solitary pulmonary nodule: Secondary | ICD-10-CM

## 2022-05-13 LAB — CYTOLOGY - NON PAP

## 2022-05-13 NOTE — Progress Notes (Signed)
Rebecca Daft, MD  Donita Brooks D PROCEDURE / BIOPSY REVIEW Date: 05/12/22  Requested Biopsy site: Left axillary lymph node Reason for request: Tissue diagnosis Imaging review: Best seen on PET  Decision: Approved Imaging modality to perform: Ultrasound Schedule with: Patient preference (Local vs Mod Sed) Schedule for: Any VIR  Additional comments: None  Please contact me with questions, concerns, or if issue pertaining to this request arise.  Burman Riis, MD [facility]

## 2022-05-18 NOTE — Progress Notes (Signed)
Sarah, I suspect the path represents a hamartoma? Or some benign fibrous tumor.   Thanks,  BLI  Garner Nash, DO [facility] 05/18/2022 5:28 PM

## 2022-05-19 ENCOUNTER — Encounter: Payer: Self-pay | Admitting: Acute Care

## 2022-05-19 ENCOUNTER — Ambulatory Visit: Payer: Medicare Other | Admitting: Acute Care

## 2022-05-19 VITALS — BP 146/62 | HR 55 | Temp 98.0°F | Ht 65.5 in | Wt 150.2 lb

## 2022-05-19 DIAGNOSIS — Z87891 Personal history of nicotine dependence: Secondary | ICD-10-CM

## 2022-05-19 DIAGNOSIS — R911 Solitary pulmonary nodule: Secondary | ICD-10-CM | POA: Diagnosis not present

## 2022-05-19 NOTE — Patient Instructions (Addendum)
It is good to see you today. You biopsies were negative for malignant cells.  This is great news.  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  Follow up with Interventional radiology at [facility] for biopsy of Lymph node as is scheduled.  Make sure to let them know you had nausea after the lung biopsies, and see if they can pre-medicate you with something to avoid this. Even if this is not general anesthesia , you may need to consider something for nausea.  Follow up with Judson Roch NP after Lymph node biopsy 06/01/2022.  Please contact office for sooner follow up if symptoms do not improve or worsen or seek emergency care

## 2022-05-19 NOTE — Progress Notes (Addendum)
History of Present Illness Rebecca Flores is a 82 y.o. female former smoker  with incidental finding of RUL lung nodule.Referred to see Dr. Valeta Harms in Feb 2024 for abnormal CT chest by Kathalene Frames,     Synopsis This is an 82 year old female, past medical history of diabetes, gastroesophageal reflux, hypercholesterolemia, hypertension, tobacco use. Patient is a former smoker quit in March 2011.Patient was referred after having CT imaging of the chest completed on 03/31/2022. Patient had some areas of groundglass within the chest and some nodularity. Most notable was a 1.8 x 1.3 cm nodule in the right upper lobe which looks very similar to his previous study in October 2023. There was also a pleural-based nodule in the left upper lobe. And a right lower lobe 1 cm nodule that looked like it had partially resolved favoring inflammation. Patient from respiratory stance has no complaints .She is very anxious about the findings on CT. Her CT scan does show significant proved from October but in October she was diagnosed with COVID-19. She is a former smoker quit several years ago.  The size of lesion in the right upper lobe is concerning for lobulated margins, and there was concern this was a potential underlying malignancy.Due to the size of the lesion and probability with [facility] calculator puts the lesion at approximately 60% risk of being malignant. She was offered PET scan to determine need for bronch, but she opted for bronch due to lesion size. Pt underwent  Flexible video fiberoptic bronchoscopy with robotic assistance and biopsies on 05/12/2022.   05/19/2022 Pt. Presents for follow up after Flexible video fiberoptic bronchoscopy with robotic assistance and biopsies on 05/12/2022. She has done well since the procedure on 05/12/2022. No fever, bleeding or discolored secretions. She did however have excessive nausea and vomiting after the procedure.   We have discussed the results of her  biopsies. She understands that the lung biopsies are negative for malignancy, but that we need to complete work up with evaluation of the left axillary lymph node that was positive on PET. This is scheduled for 06/01/2022 by IR.  She is here with her daughter today, and both  agree with the above.   Test Results:  FINAL MICROSCOPIC DIAGNOSIS:  C. 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   A.  RIGHT LUNG, UPPER LOBE, FINE NEEDLE ASPIRATION:  - Negative for malignancy  - Benign bronchial cells within a mucoid and sanguinous background  - Fibrous and fibroelastotic stroma (cellblock)   B.  RIGHT LUNG, UPPER LOBE, BRUSHING:  - Negative for malignancy  - Numerous benign bronchial cells       PET Scan [id number] Hypermetabolic mediastinal, left hilar and left axillary lymph nodes may be due to metastatic disease or a lymphoproliferative disorder. 2. Ground-glass and solid pulmonary nodules bilaterally. Largest solid nodule in the posterior segment right upper lobe does not show abnormal hypermetabolism. Some are too small for PET resolution. Recommend continued attention on follow-up as indolent adenocarcinomas cannot be excluded. 3. Left adrenal adenoma. 4. Aortic atherosclerosis (ICD10-I70.0). Coronary artery calcification.     Chest Imaging: 03/31/2022 CT chest: Multiple small areas of nodularity areas of groundglass within the chest. Possible underlying malignancy versus inflammatory lesions.    Latest Ref Rng & Units 05/12/2022    9:15 AM 11/25/2021    3:59 AM 11/24/2021  9:34 AM  CBC  WBC 4.0 - 10.5 K/uL 7.8  7.1    Hemoglobin 12.0 - 15.0 g/dL 12.2  10.0  10.2   Hematocrit 36.0 - 46.0 % 38.1  30.1  30.0   Platelets 150 - 400 K/uL 246  214         Latest Ref Rng & Units 05/12/2022    9:15 AM 11/26/2021    2:59 AM 11/25/2021    3:59 AM   BMP  Glucose 70 - 99 mg/dL 92  74  86   BUN 8 - 23 mg/dL 15  26  38   Creatinine 0.44 - 1.00 mg/dL 1.07  0.90  1.08   Sodium 135 - 145 mmol/L 137  135  133   Potassium 3.5 - 5.1 mmol/L 3.9  4.6  5.0   Chloride 98 - 111 mmol/L 107  107  111   CO2 22 - 32 mmol/L 21  20  15    Calcium 8.9 - 10.3 mg/dL 9.2  8.8  8.6     BNP No results found for: "BNP"  ProBNP No results found for: "PROBNP"  PFT No results found for: "FEV1PRE", "FEV1POST", "FVCPRE", "FVCPOST", "TLC", "DLCOUNC", "PREFEV1FVCRT", "PSTFEV1FVCRT"  DG Chest Port 1 View  Result Date: 05/12/2022 CLINICAL DATA:  Status post bronchoscopy with biopsy. EXAM: PORTABLE CHEST 1 VIEW COMPARISON:  November 24, 2021. FINDINGS: Stable cardiomediastinal silhouette. No definite pneumothorax or pleural effusion is noted status post bronchoscopy. No consolidative process is noted. Bony thorax is unremarkable. IMPRESSION: No definite pneumothorax is noted status post bronchoscopy Electronically Signed   By: Marijo Conception M.D.   On: 05/12/2022 12:47   DG C-ARM BRONCHOSCOPY  Result Date: 05/12/2022 C-ARM BRONCHOSCOPY: Fluoroscopy was utilized by the requesting physician.  No radiographic interpretation.   NM PET Image Initial (PI) Skull Base To Thigh (F-18 FDG)  Result Date: 05/11/2022 CLINICAL DATA:  Initial treatment strategy for lung nodule. EXAM: NUCLEAR MEDICINE PET SKULL BASE TO THIGH TECHNIQUE: 7.6 mCi F-18 FDG was injected intravenously. Full-ring PET imaging was performed from the skull base to thigh after the radiotracer. CT data was obtained and used for attenuation correction and anatomic localization. Fasting blood glucose: 111 mg/dl COMPARISON:  CT chest 03/31/2022, 11/24/2021. FINDINGS: Mediastinal blood pool activity: SUV max 2.5 Liver activity: SUV max NA NECK: No abnormal hypermetabolism. Incidental CT findings: None. CHEST: 8 mm high AP window lymph node (4/59), SUV max 3.1. Left hilar hypermetabolism, SUV max 4.4. Hypermetabolic  left axillary lymph node measure up to 10 mm, SUV max 10.4. Ground-glass and solid pulmonary nodules measure up to 1.4 x 1.8 cm in the posterior segment right upper lobe, SUV max 1.8. Some are too small for PET resolution. Incidental CT findings: Atherosclerotic calcification of the aorta, aortic valve and coronary arteries. Heart is enlarged. No pericardial or pleural effusion. ABDOMEN/PELVIS: 2.2 cm left adrenal nodule shows mild hypermetabolism but measures 6 Hounsfield units. No follow-up necessary. Otherwise, no abnormal hypermetabolism. Incidental CT findings: Liver is grossly unremarkable. Cholecystectomy. Right adrenal gland, kidneys, spleen, pancreas, stomach and bowel are otherwise grossly unremarkable. SKELETON: No abnormal hypermetabolism. Incidental CT findings: Degenerative changes in the spine. IMPRESSION: 1. Hypermetabolic mediastinal, left hilar and left axillary lymph nodes may be due to metastatic disease or a lymphoproliferative disorder. 2. Ground-glass and solid pulmonary nodules bilaterally. Largest solid nodule in the posterior segment right upper lobe does not show abnormal hypermetabolism. Some are too small for PET resolution. Recommend continued attention on follow-up as indolent adenocarcinomas  cannot be excluded. 3. Left adrenal adenoma. 4. Aortic atherosclerosis (ICD10-I70.0). Coronary artery calcification. Electronically Signed   By: Lorin Picket M.D.   On: 05/11/2022 15:56     Past medical hx Past Medical History:  Diagnosis Date   Bradycardia    Colon polyp    Diabetes mellitus    Family history of anesthesia complication    sister hard to wake up   GERD (gastroesophageal reflux disease)    Hypercholesteremia    Hypertension    Kidney stone    Movement disorder    Osteoporosis    Peripheral neuropathy    Seasonal allergies    Sepsis ([place])    Tobacco use    Tremors of nervous system      Social History   Tobacco Use   Smoking status: Former    Types:  Cigarettes    Quit date: 04/23/2009    Years since quitting: 13.0   Smokeless tobacco: Never  Substance Use Topics   Alcohol use: No    Alcohol/week: 1.0 standard drink of alcohol    Types: 1 Glasses of wine per week   Drug use: No    Ms.Burrow reports that she quit smoking about 13 years ago. Her smoking use included cigarettes. She has never used smokeless tobacco. She reports that she does not drink alcohol and does not use drugs.  Tobacco Cessation: Counseling given: Not Answered Former smoker , quit 2011  Past surgical hx, Family hx, Social hx all reviewed.  Current Outpatient Medications on File Prior to Visit  Medication Sig   alendronate (FOSAMAX) 70 MG tablet Take 70 mg by mouth once a week. Take with a full glass of water on an empty stomach.   amLODipine-valsartan (EXFORGE) 10-160 MG tablet Take 1 tablet by mouth daily.   buPROPion (WELLBUTRIN XL) 150 MG 24 hr tablet Take 150 mg by mouth in the morning.   Cholecalciferol 50 MCG (2000 UT) CAPS Take 2,000 Units by mouth every evening.   donepezil (ARICEPT) 5 MG tablet Take 1 tablet (5 mg total) by mouth at bedtime.   famotidine (PEPCID) 20 MG tablet Take 20 mg by mouth daily as needed for heartburn.   fenofibrate (TRICOR) 145 MG tablet Take 145 mg by mouth daily.   loratadine (CLARITIN) 10 MG tablet Take 10 mg by mouth daily as needed for allergies (congestion).    memantine (NAMENDA) 10 MG tablet Take 1 tablet (10 mg total) by mouth 2 (two) times daily.   primidone (MYSOLINE) 50 MG tablet Take 2 tablets (100 mg total) by mouth in the morning and at bedtime. Indication: essential tremors   sertraline (ZOLOFT) 25 MG tablet Take 50 mg by mouth in the morning.   traZODone (DESYREL) 50 MG tablet Take 50 mg by mouth at bedtime.   TRESIBA FLEXTOUCH 100 UNIT/ML SOPN FlexTouch Pen Inject 33 Units into the skin daily.   azelastine (OPTIVAR) 0.05 % ophthalmic solution Place 1 drop into both eyes 2 (two) times daily as needed (for  allergies). (Patient not taking: Reported on 05/08/2022)   glimepiride (AMARYL) 2 MG tablet Take 1 tablet (2 mg total) by mouth 2 (two) times daily. (Patient not taking: Reported on 05/08/2022)   No current facility-administered medications on file prior to visit.     Allergies  Allergen Reactions   Nsaids     Severe facial swelling/rash    Other Rash    Seldane   Atenolol     Hair loss    Codeine  Nausea Only    Nausea    Lyrica [Pregabalin]     Constipation, swelling   Primidone     Emotional lability Remove per patient   Simvastatin     Leg cramps   Sulfa Antibiotics     Pruritis      Review Of Systems:  Constitutional:   No  weight loss, night sweats,  Fevers, chills, fatigue, or  lassitude.  HEENT:   No headaches,  Difficulty swallowing,  Tooth/dental problems, or  Sore throat,                No sneezing, itching, ear ache, nasal congestion, post nasal drip,   CV:  No chest pain,  Orthopnea, PND, swelling in lower extremities, anasarca, dizziness, palpitations, syncope.   GI  No heartburn, indigestion, abdominal pain, nausea, vomiting, diarrhea, change in bowel habits, loss of appetite, bloody stools.   Resp: No shortness of breath with exertion or at rest.  No excess mucus, no productive cough,  No non-productive cough,  No coughing up of blood.  No change in color of mucus.  No wheezing.  No chest wall deformity  Skin: no rash or lesions.  GU: no dysuria, change in color of urine, no urgency or frequency.  No flank pain, no hematuria   MS:  No joint pain or swelling.  No decreased range of motion.  No back pain.  Psych:  No change in mood or affect. No depression or anxiety.  No memory loss.   Vital Signs BP (!) 146/62   Pulse (!) 55   Temp 98 F (36.7 C) (Oral)   Ht 5' 5.5" (1.664 m)   Wt 150 lb 3.2 oz (68.1 kg)   SpO2 98%   BMI 24.61 kg/m    Physical Exam:  General- No distress,  A&Ox3, pleasant ENT: No sinus tenderness, TM clear, pale nasal  mucosa, no oral exudate,no post nasal drip, no LAN Cardiac: S1, S2, regular rate and rhythm, no murmur Chest: No wheeze/ rales/ dullness; no accessory muscle use, no nasal flaring, no sternal retractions Abd.: Soft Non-tender, ND, BS +, Body mass index is 24.61 kg/m.  Ext: No clubbing cyanosis, edema Neuro:  normal strength, MAE x 4, A&O x 3 Skin: No rashes, warm and dry, No Lesions  Psych: normal mood and behavior   Assessment/Plan Incidental finding of upper lobe pulmonary nodule on the right side as well as a lower lobe nodule on the left.  Lung nodule Negative for malignancy on biopsy PET avid Left axillary Lymph node  noted on staging PET scan pending biopsy per IR Plan Your biopsies were negative for malignant cells.  This is great news.  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  Follow up with Interventional radiology at [facility] for biopsy of Lymph node as is scheduled.  Biopsy per IR of left axillary lymph node as scheduled 06/01/2022 for completion of work up. Make sure to let them know you had nausea after the lung biopsies, and see if they can pre-medicate you with something to avoid this. Even if this is not general anesthesia , you may need to consider something for nausea.  Follow up with Judson Roch NP after Lymph node biopsy 06/01/2022.  Please contact office for sooner follow up if symptoms do not improve or worsen or seek emergency care   I spent 40 minutes dedicated to the care of this patient on the date of this encounter to include pre-visit  review of records, face-to-face time with the patient discussing conditions above, post visit ordering of testing, clinical documentation with the electronic health record, making appropriate referrals as documented, and communicating necessary information to the patient's healthcare team.       Magdalen Spatz, NP 05/19/2022  10:09 AM

## 2022-05-28 ENCOUNTER — Ambulatory Visit (HOSPITAL_COMMUNITY): Payer: Medicare Other

## 2022-05-29 ENCOUNTER — Other Ambulatory Visit: Payer: Self-pay | Admitting: Radiology

## 2022-05-29 DIAGNOSIS — R590 Localized enlarged lymph nodes: Secondary | ICD-10-CM

## 2022-06-01 ENCOUNTER — Encounter (HOSPITAL_COMMUNITY): Payer: Self-pay

## 2022-06-01 ENCOUNTER — Ambulatory Visit (HOSPITAL_COMMUNITY)
Admission: RE | Admit: 2022-06-01 | Discharge: 2022-06-01 | Disposition: A | Discharge status: Home / Self Care | Payer: Medicare Other | Source: Ambulatory Visit | Attending: Pulmonary Disease | Admitting: Pulmonary Disease

## 2022-06-01 ENCOUNTER — Ambulatory Visit (HOSPITAL_COMMUNITY)
Admission: RE | Admit: 2022-06-01 | Discharge: 2022-06-01 | Disposition: A | Discharge status: Home / Self Care | Payer: Medicare Other | Source: Ambulatory Visit

## 2022-06-01 DIAGNOSIS — K219 Gastro-esophageal reflux disease without esophagitis: Secondary | ICD-10-CM | POA: Insufficient documentation

## 2022-06-01 DIAGNOSIS — R918 Other nonspecific abnormal finding of lung field: Secondary | ICD-10-CM | POA: Insufficient documentation

## 2022-06-01 DIAGNOSIS — R590 Localized enlarged lymph nodes: Secondary | ICD-10-CM | POA: Insufficient documentation

## 2022-06-01 DIAGNOSIS — E119 Type 2 diabetes mellitus without complications: Secondary | ICD-10-CM | POA: Diagnosis not present

## 2022-06-01 DIAGNOSIS — Z87891 Personal history of nicotine dependence: Secondary | ICD-10-CM | POA: Diagnosis not present

## 2022-06-01 DIAGNOSIS — G629 Polyneuropathy, unspecified: Secondary | ICD-10-CM | POA: Diagnosis not present

## 2022-06-01 DIAGNOSIS — R911 Solitary pulmonary nodule: Secondary | ICD-10-CM

## 2022-06-01 DIAGNOSIS — E785 Hyperlipidemia, unspecified: Secondary | ICD-10-CM | POA: Diagnosis not present

## 2022-06-01 LAB — BASIC METABOLIC PANEL
Anion gap: 8 (ref 5–15)
BUN: 19 mg/dL (ref 8–23)
CO2: 23 mmol/L (ref 22–32)
Calcium: 9.4 mg/dL (ref 8.9–10.3)
Chloride: 109 mmol/L (ref 98–111)
Creatinine, Ser: 1.02 mg/dL — ABNORMAL HIGH (ref 0.44–1.00)
GFR, Estimated: 55 mL/min — ABNORMAL LOW (ref >60)
Glucose, Bld: 106 mg/dL — ABNORMAL HIGH (ref 70–99)
Potassium: 4.1 mmol/L (ref 3.5–5.1)
Sodium: 140 mmol/L (ref 135–145)

## 2022-06-01 LAB — CBC WITH DIFFERENTIAL/PLATELET
Abs Immature Granulocytes: 0.03 10*3/uL (ref 0.00–0.07)
Basophils Absolute: 0.1 10*3/uL (ref 0.0–0.1)
Basophils Relative: 1 %
Eosinophils Absolute: 0.2 10*3/uL (ref 0.0–0.5)
Eosinophils Relative: 2 %
HCT: 35.9 % — ABNORMAL LOW (ref 36.0–46.0)
Hemoglobin: 11.7 g/dL — ABNORMAL LOW (ref 12.0–15.0)
Immature Granulocytes: 0 %
Lymphocytes Relative: 37 %
Lymphs Abs: 2.5 10*3/uL (ref 0.7–4.0)
MCH: 29.3 pg (ref 26.0–34.0)
MCHC: 32.6 g/dL (ref 30.0–36.0)
MCV: 89.8 fL (ref 80.0–100.0)
Monocytes Absolute: 0.4 10*3/uL (ref 0.1–1.0)
Monocytes Relative: 6 %
Neutro Abs: 3.6 10*3/uL (ref 1.7–7.7)
Neutrophils Relative %: 54 %
Platelets: 240 10*3/uL (ref 150–400)
RBC: 4 MIL/uL (ref 3.87–5.11)
RDW: 12.7 % (ref 11.5–15.5)
WBC: 6.7 10*3/uL (ref 4.0–10.5)
nRBC: 0 % (ref 0.0–0.2)

## 2022-06-01 LAB — PROTIME-INR
INR: 1 (ref 0.8–1.2)
Prothrombin Time: 13 seconds (ref 11.4–15.2)

## 2022-06-01 LAB — GLUCOSE, CAPILLARY: Glucose-Capillary: 103 mg/dL — ABNORMAL HIGH (ref 70–99)

## 2022-06-01 MED ORDER — SODIUM CHLORIDE 0.9 % IV SOLN: INTRAVENOUS | Status: DC

## 2022-06-01 MED ORDER — LIDOCAINE HCL 1 % IJ SOLN
INTRAMUSCULAR | Status: AC
  Filled 2022-06-01: qty 20

## 2022-06-01 NOTE — Progress Notes (Signed)
Pt ambulated to bathroom and back to room with standby assist. Tolerated well.

## 2022-06-01 NOTE — Discharge Instructions (Addendum)
Please call Interventional Radiology clinic [phone] with any questions or concerns.   You may remove your dressing and shower tomorrow.    Needle Biopsy, Care After These instructions tell you how to care for yourself after your procedure. Your doctor may also give you more specific instructions. Call your doctor if youhave any problems or questions. What can I expect after the procedure? After the procedure, it is common to have: Soreness. Bruising. Mild pain. Follow these instructions at home:  Return to your normal activities as told by your doctor. Ask your doctor what activities are safe for you. Take over-the-counter and prescription medicines only as told by your doctor. Wash your hands with soap and water before you change your bandage (dressing). If you cannot use soap and water, use hand sanitizer. Follow instructions from your doctor about: How to take care of your puncture site. When and how to change your bandage. When to remove your bandage. Check your puncture site every day for signs of infection. Watch for: Redness, swelling, or pain. Fluid or blood. Pus or a bad smell. Warmth. Do not take baths, swim, or use a hot tub until your doctor approves. Ask your doctor if you may take showers. You may only be allowed to take sponge baths. Keep all follow-up visits as told by your doctor. This is important. Contact a doctor if you have: A fever. Redness, swelling, or pain at the puncture site, and it lasts longer than a few days. Fluid, blood, or pus coming from the puncture site. Warmth coming from the puncture site. Get help right away if: You have a lot of bleeding from the puncture site. Summary After the procedure, it is common to have soreness, bruising, or mild pain at the puncture site. Check your puncture site every day for signs of infection, such as redness, swelling, or pain. Get help right away if you have severe bleeding from your puncture  site. This information is not intended to replace advice given to you by your health care provider. Make sure you discuss any questions you have with your healthcare provider. Document Revised: 08/10/2019 Document Reviewed: 08/10/2019 Elsevier Patient Education  2022 Elsevier Inc[employer]    

## 2022-06-01 NOTE — Procedures (Signed)
Interventional Radiology Procedure Note  Procedure: US guided core biopsy of left axillary LN  Complications: None  Estimated Blood Loss: None  Recommendations: - DC home   Signed,  Susen Haskew K. Berdell Nevitt, MD   

## 2022-06-01 NOTE — Consult Note (Signed)
Chief Complaint: Patient was seen in consultation today for  US guided left axillary lymph node biopsy   Referring Physician(s): Icard,Bradley L  Supervising Physician: Malachy Moan  Patient Status: [facility] - Out-pt  History of Present Illness: Rebecca Flores is an 82 y.o. female , ex smoker, with PMH DM, GERD, HLD, HTN, renal stones, peripheral neuropathy who is s/p negative RUL lung nodule bronchoscopic bx and left hilar LN bx on 05/12/22. She had a PET scan on 05/11/22 revealing:  1. Hypermetabolic mediastinal, left hilar and left axillary lymph nodes may be due to metastatic disease or a lymphoproliferative disorder. 2. Ground-glass and solid pulmonary nodules bilaterally. Largest solid nodule in the posterior segment right upper lobe does not show abnormal hypermetabolism. Some are too small for PET resolution. Recommend continued attention on follow-up as indolent adenocarcinomas cannot be excluded. 3. Left adrenal adenoma. 4. Aortic atherosclerosis (ICD10-I70.0). Coronary artery calcification.  She has no hx cancer. She presents today for US guided left axillary LN bx for further evaluation.    Past Medical History:  Diagnosis Date   Bradycardia    Colon polyp    Diabetes mellitus    Family history of anesthesia complication    sister hard to wake up   GERD (gastroesophageal reflux disease)    Hypercholesteremia    Hypertension    Kidney stone    Movement disorder    Osteoporosis    Peripheral neuropathy    Seasonal allergies    Sepsis    Tobacco use    Tremors of nervous system     Past Surgical History:  Procedure Laterality Date   ABDOMINAL HYSTERECTOMY     APPENDECTOMY     bladder tacking     BRONCHIAL BIOPSY  05/12/2022   Procedure: BRONCHIAL BIOPSIES;  Surgeon: Josephine Igo, DO;  Location: [facility];  Service: Pulmonary;;   BRONCHIAL BRUSHINGS  05/12/2022   Procedure: BRONCHIAL BRUSHINGS;  Surgeon: Josephine Igo, DO;   Location: [facility];  Service: Pulmonary;;   BRONCHIAL NEEDLE ASPIRATION BIOPSY  05/12/2022   Procedure: BRONCHIAL NEEDLE ASPIRATION BIOPSIES;  Surgeon: Josephine Igo, DO;  Location: [facility];  Service: Pulmonary;;   CHOLECYSTECTOMY  1998   COLONOSCOPY     ENDOBRONCHIAL ULTRASOUND Bilateral 05/12/2022   Procedure: ENDOBRONCHIAL ULTRASOUND;  Surgeon: Josephine Igo, DO;  Location: [facility];  Service: Pulmonary;  Laterality: Bilateral;   FINE NEEDLE ASPIRATION  05/12/2022   Procedure: FINE NEEDLE ASPIRATION (FNA) LINEAR;  Surgeon: Josephine Igo, DO;  Location: [facility];  Service: Pulmonary;;   HERNIA REPAIR  05/2013   INGUINAL HERNIA REPAIR Right 06/27/2013   Procedure: RIGHT INGUINAL HERNIA REPAIR WITH MESH;  Surgeon: Velora Heckler, MD;  Location: [facility];  Service: General;  Laterality: Right;   INSERTION OF MESH Right 06/27/2013   Procedure: INSERTION OF MESH;  Surgeon: Velora Heckler, MD;  Location: [facility];  Service: General;  Laterality: Right;   MOUTH SURGERY     TONSILLECTOMY      Allergies: Nsaids, Other, Atenolol, Codeine, Lyrica [pregabalin], Simvastatin, and Sulfa antibiotics  Medications: Prior to Admission medications   Medication Sig Start Date End Date Taking? Authorizing Provider  amLODipine-valsartan (EXFORGE) 10-160 MG tablet Take 1 tablet by mouth daily. 10/08/21  Yes [provider]  buPROPion (WELLBUTRIN XL) 150 MG 24 hr tablet Take 150 mg by mouth in the morning. 04/07/21  Yes [provider]  Cholecalciferol 50 MCG (2000 UT) CAPS Take  2,000 Units by mouth every evening.   Yes [provider]  donepezil (ARICEPT) 5 MG tablet Take 1 tablet (5 mg total) by mouth at bedtime. 10/08/21  Yes Glean Salvo, NP  famotidine (PEPCID) 20 MG tablet Take 20 mg by mouth daily as needed for heartburn.   Yes [provider]  fenofibrate (TRICOR) 145 MG tablet Take 145 mg by mouth daily. 03/21/22  Yes  [provider]  loratadine (CLARITIN) 10 MG tablet Take 10 mg by mouth daily as needed for allergies (congestion).    Yes [provider]  memantine (NAMENDA) 10 MG tablet Take 1 tablet (10 mg total) by mouth 2 (two) times daily. 10/08/21  Yes Glean Salvo, NP  primidone (MYSOLINE) 50 MG tablet Take 2 tablets (100 mg total) by mouth in the morning and at bedtime. Indication: essential tremors 10/08/21  Yes Glean Salvo, NP  sertraline (ZOLOFT) 25 MG tablet Take 50 mg by mouth in the morning. 03/15/21  Yes [provider]  traZODone (DESYREL) 50 MG tablet Take 50 mg by mouth at bedtime. 03/15/21  Yes [provider]  TRESIBA FLEXTOUCH 100 UNIT/ML SOPN FlexTouch Pen Inject 33 Units into the skin daily. 08/06/17  Yes [provider]  alendronate (FOSAMAX) 70 MG tablet Take 70 mg by mouth once a week. Take with a full glass of water on an empty stomach.    [provider]  azelastine (OPTIVAR) 0.05 % ophthalmic solution Place 1 drop into both eyes 2 (two) times daily as needed (for allergies). Patient not taking: Reported on 05/08/2022 10/31/21   [provider]  glimepiride (AMARYL) 2 MG tablet Take 1 tablet (2 mg total) by mouth 2 (two) times daily. Patient not taking: Reported on 05/08/2022 12/03/21   Joseph Art, DO     Family History  Problem Relation Age of Onset   Dementia Mother    Diabetes Mellitus II Father    Prostate cancer Father    Dementia Sister    Breast cancer Sister    Breast cancer Daughter     Social History   Socioeconomic History   Marital status: Widowed    Spouse name: Not on file   Number of children: 2   Years of education: 9TH   Highest education level: Not on file  Occupational History    Employer: RETIRED  Tobacco Use   Smoking status: Former    Types: Cigarettes    Quit date: 04/23/2009    Years since quitting: 13.1   Smokeless tobacco: Never  Substance and Sexual Activity   Alcohol use:  No    Alcohol/week: 1.0 standard drink of alcohol    Types: 1 Glasses of wine per week   Drug use: No   Sexual activity: Never  Other Topics Concern   Not on file  Social History Narrative   Patient is widowed with 2 children   09/07/16 Lives alone   Patient has a 9th grade education   Patient is right handed   Patient does not drink caffeine   Social Determinants of Health   Financial Resource Strain: Not on file  Food Insecurity: Patient Declined (11/25/2021)   Hunger Vital Sign    Worried About Running Out of Food in the Last Year: Patient declined    Ran Out of Food in the Last Year: Patient declined  Transportation Needs: No Transportation Needs (11/25/2021)   PRAPARE - Administrator, Civil Service (Medical): No  Lack of Transportation (Non-Medical): No  Physical Activity: Not on file  Stress: Not on file  Social Connections: Not on file      Review of Systems denies fever,HA, dyspnea, cough, CP,abd pain,N/V or bleedinh ; does have occ back pain  Vital Signs: BP (!) 192/59 (BP Location: Right Arm)   Pulse (!) 50   Temp 97.6 F (36.4 C) (Oral)   Resp 15   SpO2 98%   Code Status: FULL CODE   Physical Exam awake/answers simple questions ok;  chest- CTA bilat; heart- RRR; abd- soft,+BS,NT; no LE edema  Imaging: DG Chest Port 1 View  Result Date: 05/12/2022 CLINICAL DATA:  Status post bronchoscopy with biopsy. EXAM: PORTABLE CHEST 1 VIEW COMPARISON:  November 24, 2021. FINDINGS: Stable cardiomediastinal silhouette. No definite pneumothorax or pleural effusion is noted status post bronchoscopy. No consolidative process is noted. Bony thorax is unremarkable. IMPRESSION: No definite pneumothorax is noted status post bronchoscopy Electronically Signed   By: Lupita RaiderJames  Green Jr M.D.   On: 05/12/2022 12:47   DG C-ARM BRONCHOSCOPY  Result Date: 05/12/2022 C-ARM BRONCHOSCOPY: Fluoroscopy was utilized by the requesting physician.  No radiographic interpretation.    NM PET Image Initial (PI) Skull Base To Thigh (F-18 FDG)  Result Date: 05/11/2022 CLINICAL DATA:  Initial treatment strategy for lung nodule. EXAM: NUCLEAR MEDICINE PET SKULL BASE TO THIGH TECHNIQUE: 7.6 mCi F-18 FDG was injected intravenously. Full-ring PET imaging was performed from the skull base to thigh after the radiotracer. CT data was obtained and used for attenuation correction and anatomic localization. Fasting blood glucose: 111 mg/dl COMPARISON:  CT chest 16/10/960402/07/2022, 11/24/2021. FINDINGS: Mediastinal blood pool activity: SUV max 2.5 Liver activity: SUV max NA NECK: No abnormal hypermetabolism. Incidental CT findings: None. CHEST: 8 mm high AP window lymph node (4/59), SUV max 3.1. Left hilar hypermetabolism, SUV max 4.4. Hypermetabolic left axillary lymph node measure up to 10 mm, SUV max 10.4. Ground-glass and solid pulmonary nodules measure up to 1.4 x 1.8 cm in the posterior segment right upper lobe, SUV max 1.8. Some are too small for PET resolution. Incidental CT findings: Atherosclerotic calcification of the aorta, aortic valve and coronary arteries. Heart is enlarged. No pericardial or pleural effusion. ABDOMEN/PELVIS: 2.2 cm left adrenal nodule shows mild hypermetabolism but measures 6 Hounsfield units. No follow-up necessary. Otherwise, no abnormal hypermetabolism. Incidental CT findings: Liver is grossly unremarkable. Cholecystectomy. Right adrenal gland, kidneys, spleen, pancreas, stomach and bowel are otherwise grossly unremarkable. SKELETON: No abnormal hypermetabolism. Incidental CT findings: Degenerative changes in the spine. IMPRESSION: 1. Hypermetabolic mediastinal, left hilar and left axillary lymph nodes may be due to metastatic disease or a lymphoproliferative disorder. 2. Ground-glass and solid pulmonary nodules bilaterally. Largest solid nodule in the posterior segment right upper lobe does not show abnormal hypermetabolism. Some are too small for PET resolution. Recommend  continued attention on follow-up as indolent adenocarcinomas cannot be excluded. 3. Left adrenal adenoma. 4. Aortic atherosclerosis (ICD10-I70.0). Coronary artery calcification. Electronically Signed   By: Leanna BattlesMelinda  Blietz M.D.   On: 05/11/2022 15:56    Labs:  CBC: Recent Labs    11/23/21 2216 11/24/21 0934 11/25/21 0359 05/12/22 0915 06/01/22 1130  WBC 10.3  --  7.1 7.8 6.7  HGB 12.0 10.2* 10.0* 12.2 11.7*  HCT 36.0 30.0* 30.1* 38.1 35.9*  PLT 242  --  214 246 240    COAGS: Recent Labs    06/01/22 1130  INR 1.0    BMP: Recent Labs    11/25/21 0359  11/26/21 0259 05/12/22 0915 06/01/22 1130  NA 133* 135 137 140  K 5.0 4.6 3.9 4.1  CL 111 107 107 109  CO2 15* 20* 21* 23  GLUCOSE 86 74 92 106*  BUN 38* 26* 15 19  CALCIUM 8.6* 8.8* 9.2 9.4  CREATININE 1.08* 0.90 1.07* 1.02*  GFRNONAA 51* >60 52* 55*    LIVER FUNCTION TESTS: Recent Labs    11/24/21 0929  BILITOT 0.4  AST 19  ALT 12  ALKPHOS 91  PROT 6.0*  ALBUMIN 3.0*    TUMOR MARKERS: No results for input(s): "AFPTM", "CEA", "CA199", "CHROMGRNA" in the last 8760 hours.  Assessment and Plan: 82 y.o. female , ex smoker, with PMH DM, GERD, HLD, HTN, renal stones, peripheral neuropathy who is s/p negative RUL lung nodule bronchoscopic bx and left hilar LN bx on 05/12/22. She had a PET scan on 05/11/22 revealing:  1. Hypermetabolic mediastinal, left hilar and left axillary lymph nodes may be due to metastatic disease or a lymphoproliferative disorder. 2. Ground-glass and solid pulmonary nodules bilaterally. Largest solid nodule in the posterior segment right upper lobe does not show abnormal hypermetabolism. Some are too small for PET resolution. Recommend continued attention on follow-up as indolent adenocarcinomas cannot be excluded. 3. Left adrenal adenoma. 4. Aortic atherosclerosis (ICD10-I70.0). Coronary artery calcification.  She has no hx cancer. She presents today for US guided left axillary LN bx  for further evaluation.Risks and benefits of procedure was discussed with the patient/daughter in law including, but not limited to bleeding, infection, damage to adjacent structures or low yield requiring additional tests.  All of the questions were answered and there is agreement to proceed.  Consent signed and in chart.    Thank you for this interesting consult.  I greatly enjoyed meeting [employer] and look forward to participating in their care.  A copy of this report was sent to the requesting provider on this date.  Electronically Signed: D. Jeananne Rama, PA-C 06/01/2022, 12:30 PM   I spent a total of  20 minutes   in face to face in clinical consultation, greater than 50% of which was counseling/coordinating care for US guided left axillary lymph node biopsy

## 2022-06-03 LAB — SURGICAL PATHOLOGY

## 2022-06-12 ENCOUNTER — Ambulatory Visit: Payer: Medicare Other | Admitting: Acute Care

## 2022-06-12 ENCOUNTER — Encounter: Payer: Self-pay | Admitting: Acute Care

## 2022-06-12 ENCOUNTER — Telehealth: Payer: Self-pay | Admitting: Internal Medicine

## 2022-06-12 VITALS — BP 144/64 | HR 47 | Temp 97.8°F | Ht 65.5 in | Wt 151.2 lb

## 2022-06-12 DIAGNOSIS — Z87891 Personal history of nicotine dependence: Secondary | ICD-10-CM | POA: Diagnosis not present

## 2022-06-12 DIAGNOSIS — C8290 Follicular lymphoma, unspecified, unspecified site: Secondary | ICD-10-CM | POA: Diagnosis not present

## 2022-06-12 DIAGNOSIS — Z9889 Other specified postprocedural states: Secondary | ICD-10-CM | POA: Diagnosis not present

## 2022-06-12 DIAGNOSIS — R911 Solitary pulmonary nodule: Secondary | ICD-10-CM

## 2022-06-12 NOTE — Progress Notes (Signed)
History of Present Illness Rebecca Flores is a 82 y.o. female former smoker  with with PMH DM, GERD, HLD, HTN, renal stones, peripheral neuropathy . There was an incidental finding of RUL lung nodule.Referred to see Dr. Tonia Brooms in Feb 2024 for abnormal CT chest by Emilio Aspen,.  Synopsis 82 year old female, past medical history of diabetes, gastroesophageal reflux, hypercholesterolemia, hypertension, tobacco use. Patient is a former smoker quit in March 2011.Patient was referred after having CT imaging of the chest completed on 03/31/2022. Patient had some areas of groundglass within the chest and some nodularity. Most notable was a 1.8 x 1.3 cm nodule in the right upper lobe which looks very similar to his previous study in October 2023. There was also a pleural-based nodule in the left upper lobe. And a right lower lobe 1 cm nodule that looked like it had partially resolved favoring inflammation. Patient from respiratory stance has no complaints .She is very anxious about the findings on CT. Her CT scan does show significant proved from October but in October she was diagnosed with COVID-19. She is a former smoker quit several years ago.  The size of lesion in the right upper lobe is concerning for lobulated margins, and there was concern this was a potential underlying malignancy.Due to the size of the lesion and probability with [facility] calculator puts the lesion at approximately 60% risk of being malignant. She was offered PET scan to determine need for bronch, but she opted for bronch due to lesion size. Pt underwent  Flexible video fiberoptic bronchoscopy with robotic assistance and biopsies on 05/12/2022. Lung biopsies were negative for lung cancer , but we needed to evaluate the left axillary lymph node that was hypermetabolic on PET scan to complete her work up. She underwent US guided left axillary LN bx  for further evaluation on 06/01/2022. She is here today to discuss the  results of the left axillary lymph node biopsy     06/12/2022 Pt. Presents for follow up with her daughter and son  to review the results of her left axillary node biopsy.We discussed that her biopsy was consistent with a low grade lymphoma. I explained this as a cancer of the white blood cells with the help of her son and daughter. We discussed that there are several options for treatment, and that I was referring her to see specialists at the cancer center. I have placed referrals to radiation oncology and medical oncology. I have noted her daughter is to be the point of contact and included her number in the referrals.  Pt was tearful, but was reassured that we were going to make sure she received  the best options for treatment . She has been doing well since the procedure. No fevers or weight loss. She has amazing family support.  Test Results: Surgical Pathology 06/01/2022 FINAL MICROSCOPIC DIAGNOSIS:   A. LYMPH NODE, LEFT AXILLARY, NEEDLE CORE BIOPSY:  -  Consistent with follicular lymphoma, favor low-grade, see note.   The biopsy consists of heavily fragmented thin needle cores consisting with what appears to be a predominantly small lymphocytes the morphology is slightly hindered due to suboptimal fixation and the fragmented nature of the biopsy.  The cells are B cells (CD20 positive) with a germinal center immunophenotype (CD10 and BCL6 positive) with aberrant Bcl-2 staining consistent with a follicular lymphoma. CD3/CD5/CD43 highlight background small T cells.  Cyclin D1 is appropriately negative.  Flow cytometry likewise identifies lambda restricted CD10 positive B cells at 34%  supporting the above interpretation.  While CD23 is predominantly lost morphologically there still appears to be a predominantly nodular/follicular architecture. Centroblasts are few (less than 5/hpf) on this limited biopsy.  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 Clinical/radiologic correlation necessary.   Favors Low Grade Non-Hodgkins Lymphoma  Cytology 05/12/2022 LYMPH NODE, 11L, FINE NEEDLE ASPIRATION: - Negative for malignancy - Compatible with a benign lymph node   Dr. Tonia Brooms feels this is a hamartoma or some benign fibrous tumor.    A.  RIGHT LUNG, UPPER LOBE, FINE NEEDLE ASPIRATION:  - Negative for malignancy  - Benign bronchial cells within a mucoid and sanguinous background  - Fibrous and fibroelastotic stroma (cellblock)   B.  RIGHT LUNG, UPPER LOBE, BRUSHING:  - Negative for malignancy  - Numerous benign bronchial cells   PET scan on 05/11/22  Hypermetabolic mediastinal, left hilar and left axillary lymph nodes may be due to metastatic disease or a lymphoproliferative disorder. 2. Ground-glass and solid pulmonary nodules bilaterally. Largest solid nodule in the posterior segment right upper lobe does not show abnormal hypermetabolism. Some are too small for PET resolution. Recommend continued attention on follow-up as indolent adenocarcinomas cannot be excluded. 3. Left adrenal adenoma. 4. Aortic atherosclerosis (ICD10-I70.0). Coronary artery calcification.       Latest Ref Rng & Units 06/01/2022   11:30 AM 05/12/2022    9:15 AM 11/25/2021    3:59 AM  CBC  WBC 4.0 - 10.5 K/uL 6.7  7.8  7.1   Hemoglobin 12.0 - 15.0 g/dL 16.1  09.6  04.5   Hematocrit 36.0 - 46.0 % 35.9  38.1  30.1   Platelets 150 - 400 K/uL 240  246  214        Latest Ref Rng & Units 06/01/2022   11:30 AM 05/12/2022    9:15 AM 11/26/2021    2:59 AM  BMP  Glucose 70 - 99 mg/dL 409  92  74   BUN 8 - 23 mg/dL Creatinine 0.44 - 1.00 mg/dL 8.11  9.14  7.82   Sodium 135 - 145 mmol/L 140  137  135   Potassium 3.5 - 5.1 mmol/L 4.1  3.9  4.6   Chloride 98 -  111 mmol/L 109  107  107   CO2 22 - 32 mmol/L Calcium 8.9 - 10.3 mg/dL 9.4  9.2  8.8     BNP No results found for: "BNP"  ProBNP No results found for: "PROBNP"  PFT No results found for: "FEV1PRE", "FEV1POST", "FVCPRE", "FVCPOST", "TLC", "DLCOUNC", "PREFEV1FVCRT", "PSTFEV1FVCRT"  [place] CORE BIOPSY (LYMPH NODES)  Result Date: 06/01/2022 INDICATION: 82 year old female with pulmonary nodules and hypermetabolic mediastinal, hilar and left axillary lymphadenopathy. She presents for ultrasound-guided core biopsy of the left axillary lymph node. EXAM: ULTRASOUND BIOPSY OF THE LYMPH NODES MEDICATIONS: None. ANESTHESIA/SEDATION: None. FLUOROSCOPY TIME:  None. COMPLICATIONS: None immediate. PROCEDURE: Informed written consent was obtained from the patient after a thorough discussion of the procedural risks, benefits and alternatives. All questions were addressed. Maximal Sterile Barrier Technique was utilized including caps, mask, sterile gowns, sterile gloves, sterile drape, hand hygiene and skin antiseptic. A timeout was performed prior to the initiation of the procedure. The left axilla was interrogated with  ultrasound. The abnormal lymph node adjacent to the axillary artery and vein was successfully identified. A suitable skin entry site was selected and marked. Local anesthesia was attained by infiltration with 1% lidocaine. A small dermatotomy was made. Under real-time ultrasound guidance, multiple 18 gauge core biopsies were obtained using the Bard mission automated biopsy device. Biopsy specimens were placed on a moistened Telfa pad and placed in saline before being delivered to pathology for further analysis. Post biopsy ultrasound imaging demonstrates no evidence of complication. The patient tolerated the procedure well. IMPRESSION: Ultrasound-guided core biopsy of abnormal left axillary lymph node. Electronically Signed   By: Malachy Moan M.D.   On: 06/01/2022 14:40     Past medical  hx Past Medical History:  Diagnosis Date   Bradycardia    Colon polyp    Diabetes mellitus    Family history of anesthesia complication    sister hard to wake up   GERD (gastroesophageal reflux disease)    Hypercholesteremia    Hypertension    Kidney stone    Movement disorder    Osteoporosis    Peripheral neuropathy    Seasonal allergies    Sepsis    Tobacco use    Tremors of nervous system      Social History   Tobacco Use   Smoking status: Former    Types: Cigarettes    Quit date: 04/23/2009    Years since quitting: 13.1   Smokeless tobacco: Never  Substance Use Topics   Alcohol use: No    Alcohol/week: 1.0 standard drink of alcohol    Types: 1 Glasses of wine per week   Drug use: No    Ms.Boerema reports that she quit smoking about 13 years ago. Her smoking use included cigarettes. She has never used smokeless tobacco. She reports that she does not drink alcohol and does not use drugs.  Tobacco Cessation: Former smoker , Quit 2011   Past surgical hx, Family hx, Social hx all reviewed.  Current Outpatient Medications on File Prior to Visit  Medication Sig   alendronate (FOSAMAX) 70 MG tablet Take 70 mg by mouth once a week. Take with a full glass of water on an empty stomach.   amLODipine-valsartan (EXFORGE) 10-160 MG tablet Take 1 tablet by mouth daily.   buPROPion (WELLBUTRIN XL) 150 MG 24 hr tablet Take 150 mg by mouth in the morning.   Cholecalciferol 50 MCG (2000 UT) CAPS Take 2,000 Units by mouth every evening.   donepezil (ARICEPT) 5 MG tablet Take 1 tablet (5 mg total) by mouth at bedtime.   famotidine (PEPCID) 20 MG tablet Take 20 mg by mouth daily as needed for heartburn.   fenofibrate (TRICOR) 145 MG tablet Take 145 mg by mouth daily.   loratadine (CLARITIN) 10 MG tablet Take 10 mg by mouth daily as needed for allergies (congestion).    memantine (NAMENDA) 10 MG tablet Take 1 tablet (10 mg total) by mouth 2 (two) times daily.   primidone (MYSOLINE) 50  MG tablet Take 2 tablets (100 mg total) by mouth in the morning and at bedtime. Indication: essential tremors   sertraline (ZOLOFT) 25 MG tablet Take 50 mg by mouth in the morning.   traZODone (DESYREL) 50 MG tablet Take 50 mg by mouth at bedtime.   TRESIBA FLEXTOUCH 100 UNIT/ML SOPN FlexTouch Pen Inject 33 Units into the skin daily.   azelastine (OPTIVAR) 0.05 % ophthalmic solution Place 1 drop into both eyes 2 (two) times daily as needed (  for allergies). (Patient not taking: Reported on 05/08/2022)   glimepiride (AMARYL) 2 MG tablet Take 1 tablet (2 mg total) by mouth 2 (two) times daily. (Patient not taking: Reported on 05/08/2022)   No current facility-administered medications on file prior to visit.     Allergies  Allergen Reactions   Nsaids     Severe facial swelling/rash Pt takes "advil and ibuprophen without problems"   Other Rash    Seldane   Atenolol     Hair loss    Codeine Nausea Only    Nausea    Lyrica [Pregabalin]     Constipation, swelling   Simvastatin     Leg cramps   Sulfa Antibiotics     Pruritis      Review Of Systems:  Constitutional:   No  weight loss, night sweats,  Fevers, chills, fatigue, or  lassitude.  HEENT:   No headaches,  Difficulty swallowing,  Tooth/dental problems, or  Sore throat,                No sneezing, itching, ear ache, nasal congestion, post nasal drip,   CV:  No chest pain,  Orthopnea, PND, swelling in lower extremities, anasarca, dizziness, palpitations, syncope.   GI  No heartburn, indigestion, abdominal pain, nausea, vomiting, diarrhea, change in bowel habits, loss of appetite, bloody stools.   Resp: No shortness of breath with exertion or at rest.  No excess mucus, no productive cough,  No non-productive cough,  No coughing up of blood.  No change in color of mucus.  No wheezing.  No chest wall deformity  Skin: no rash or lesions.  GU: no dysuria, change in color of urine, no urgency or frequency.  No flank pain, no  hematuria   MS:  No joint pain or swelling.  No decreased range of motion.  No back pain.  Psych:  No change in mood or affect. No depression or anxiety.  No memory loss.   Vital Signs BP (!) 144/64   Pulse (!) 47   Temp 97.8 F (36.6 C)   Ht 5' 5.5" (1.664 m)   Wt 151 lb 3.2 oz (68.6 kg)   SpO2 99%   BMI 24.78 kg/m    Physical Exam:  General- No distress,  A&Ox3, anxious but appropriately so ENT: No sinus tenderness, TM clear, pale nasal mucosa, no oral exudate,no post nasal drip, no LAN Cardiac: S1, S2, regular rate and rhythm, no murmur Chest: No wheeze/ rales/ dullness; no accessory muscle use, no nasal flaring, no sternal retractions Abd.: Soft Non-tender, ND, BS +, Body mass index is 24.78 kg/m.  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  Follow up as needed  CT Chest without contrast in 1 year to ensure lung nodule is stable  Please contact office for sooner follow up if symptoms do not improve or worsen or seek emergency care    I spent 40 minutes dedicated to the care of this patient on the date of this encounter to include pre-visit review of records, face-to-face time with the patient discussing conditions above, post visit ordering of  testing, clinical documentation with the electronic health record, making appropriate referrals as documented, and communicating necessary information to the patient's healthcare team.     Bevelyn Ngo, NP 06/12/2022  8:35 AM

## 2022-06-12 NOTE — Telephone Encounter (Signed)
Patients daughter called to check status of urgent referral; sent over to new patient scheduling line

## 2022-06-12 NOTE — Progress Notes (Signed)
Lymphoma Location(s) / Histology: left hilar and left axillary lymph nodes   CT CHEST WO CONTRAST 11-24-21 IMPRESSION: 1. The appearance of the lungs is most suggestive of multilobar pneumonia, potentially from viral infection such as reported COVID pneumonia. 2. Left lower lobe pulmonary nodule with a mean diameter of 1 cm. Consider Flores of the following in 3 months for both low-risk and high-risk individuals: (a) repeat chest CT or (b) follow-up PET-CT. This recommendation follows the consensus statement: Guidelines for Management of Incidental Pulmonary Nodules Detected on CT Images: From the Fleischner Society 2017; Radiology 2017; 284:228-243. 3. Aortic atherosclerosis, in addition to left main and 2 vessel coronary artery disease. Please note that although the presence of coronary artery calcium documents the presence of coronary artery disease, the severity of this disease and any potential stenosis cannot be assessed on this non-gated CT examination. Assessment for potential risk factor modification, dietary therapy or pharmacologic therapy may be warranted, if clinically indicated.   CT CHEST WO CONTRAST 03-31-22 IMPRESSION: 1. While the overall appearance of the lungs has significantly improved with resolution or regression of multifocal ground-glass attenuation seen on the prior study, there are residual areas of nodularity, as detailed above. Although the stability is reassuring compared to the prior study, the possibility of neoplasm should be considered. Further evaluation with PET-CT is recommended in the near future to better evaluate these findings and assess for potential malignancy. 2. Aortic atherosclerosis, in addition to left main and three-vessel coronary artery disease. 3. Small hiatal hernia.  PET scan on 05-08-22 IMPRESSION: 1. Hypermetabolic mediastinal, left hilar and left axillary lymph nodes may be due to metastatic disease or a  lymphoproliferative disorder. 2. Ground-glass and solid pulmonary nodules bilaterally. Largest solid nodule in the posterior segment right upper lobe does not show abnormal hypermetabolism. Some are too small for PET resolution. Recommend continued attention on follow-up as indolent adenocarcinomas cannot be excluded. 3. Left adrenal adenoma. 4. Aortic atherosclerosis (ICD10-I70.0). Coronary artery calcification.  Rebecca Flores presented with symptoms of: CT findings  Biopsies revealed:  Clinical History: Pulmonary nodules and lymphadenopathy all FDG + on PET CT. Lung cancer with nodal mets vs lymphoma (crm)  FINAL MICROSCOPIC DIAGNOSIS:  A. LYMPH NODE, LEFT AXILLARY, NEEDLE CORE BIOPSY: -  Consistent with follicular lymphoma, favor low-grade, see note.  Note: The biopsy consists of heavily fragmented thin needle cores consisting with what appears to be a predominantly small lymphocytes the morphology is slightly hindered due to suboptimal fixation and the fragmented nature of the biopsy.  The cells are B cells (CD20 positive) with a germinal center immunophenotype (CD10 and BCL6 positive) with aberrant Bcl-2 staining consistent with a follicular lymphoma. CD3/CD5/CD43 highlight background small T cells.  Cyclin D1 is appropriately negative.  Flow cytometry likewise identifies lambda restricted CD10 positive B cells at 34% supporting the above interpretation.  While CD23 is predominantly lost morphologically there still appears to be a predominantly nodular/follicular architecture. Centroblasts are few (less than 5/hpf) on this limited biopsy.  In addition the proliferation rate by Ki-67 is overall low approximately 5 to 10% favoring a low-grade (grade 1 on current material) follicular lymphoma; however, sampling cannot be excluded especially in light of the noted FDG positivity present in the clinical history. Clinical/radiologic correlation  necessary.  Past/Anticipated interventions by medical oncology, if any: Pt to see Dr. Candise Che on 06-18-22.   Weight changes, if any, over the past 6 months: {:18581}  Recurrent fevers, or drenching night sweats, if any: {:18581}  SAFETY ISSUES:  Prior radiation? {:18581} Pacemaker/ICD? {:18581} Possible current pregnancy? no Is the patient on methotrexate? no  Current Complaints / other details:  Patient is a former smoker quit in March 2011   06-01-22 Interventional Radiology Procedure Note   Procedure: US guided core biopsy of left axillary LN  Video Bronchoscopy with Robotic Assisted Bronchoscopic Navigation  Video Bronchoscopy with Endobronchial Ultrasound Procedure Note   Date of Operation: 05/12/2022    Pre-op Diagnosis: Lung nodule    Post-op Diagnosis: Lung nodule   Surgeon: Josephine Igo, DO   Dr. Heidi Dach and P on 04-17-22 Assessment & Plan:        ICD-10-CM    1. Multiple pulmonary nodules  R91.8 NM PET Image Initial (PI) Skull Base To Thigh (F-18 FDG)      Ambulatory referral to Pulmonology      Procedural/ Surgical Case Request: ROBOTIC ASSISTED NAVIGATIONAL BRONCHOSCOPY      Novel Coronavirus, NAA ([facility])     2. Former smoker  Z87.891 NM PET Image Initial (PI) Skull Base To Thigh (F-18 FDG)      Ambulatory referral to Pulmonology      Procedural/ Surgical Case Request: ROBOTIC ASSISTED NAVIGATIONAL BRONCHOSCOPY     3. History of COVID-19  Z86.16         Discussion: This is an 82 year old female with incidentally found upper lobe pulmonary nodule on the right side as well as a lower lobe nodule on the left.  She had initial CT imaging in October which showed groundglass peripheral infiltrates that was consistent with her recent diagnosis of COVID-19.  She is a former smoker quit several years ago.   Plan: The size of lesion in the right upper lobe is concerning for lobulated margins.  I did explain that we are likely dealing with a potential underlying  malignancy. I talked about all of the various options to include ongoing restratification with nuclear medicine pet imaging versus conservative CT image follow-up. Due to the size of the lesion and probability with [facility] calculator puts the lesion at approximately 60% risk of being malignant. I explained the PET scan would help with service [place] in altering the probability of malignancy within the lesion. However due to the lesion size we could consider biopsy based on that alone. She is agreeable to this plan. We will tentatively get PET scan complete prior to bronchoscopy. Tentative bronchoscopy date will be on May 12, 2022.

## 2022-06-12 NOTE — Patient Instructions (Addendum)
It is good to see you today . The biopsy of the lymph node was positive for Lymphoma, non-Hodgkins low grade I have referred you to radiation oncology and medical oncology for treatment options They will take great care of you.  Follow up as needed  CT Chest without contrast in 1 year to ensure lung nodule is stable  Please contact office for sooner follow up if symptoms do not improve or worsen or seek emergency care

## 2022-06-15 NOTE — Progress Notes (Signed)
Discussed at recent office visit with SG  Thanks,  BLI  Shalayah Beagley L Gwen Edler, DO [facility] 06/15/2022 8:02 AM  

## 2022-06-15 NOTE — Progress Notes (Signed)
Discussed at recent office visit with SG  Thanks,  BLI  Josephine Igo, DO [facility] 06/15/2022 8:02 AM

## 2022-06-15 NOTE — Progress Notes (Signed)
Radiation Oncology         [phone] ________________________________  Initial Outpatient Consultation  Name: Rebecca Flores MRN: [id number]  Date: 06/16/2022  DOB: [birth year]  [id number], Sherie Don, MD  Josephine Igo, DO   REFERRING PHYSICIAN: Josephine Igo, DO  DIAGNOSIS:    ICD-10-CM   1. 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  CHIEF COMPLAINT: Here to discuss management of non-hodgkin's lymphoma   HISTORY OF PRESENT ILLNESS::Rebecca Flores is a 82 y.o. female with Alzheimer's disease who is a former smoker with a history of pulmonary nodules. She was diagnosed with COVID this past October 2023 and had a chest CT performed at that time which showed groundglass peripheral infiltrates that were consistent with her recent diagnosis of COVID-19   She recently presented for a follow-up chest CT on 03/31/22 (in the setting her her history of pulmonary nodules) which showed several areas of nodularity in the chest, including  a 1.8 x 1.3 cm nodule in the right upper lobe with possible lobulated margins, which displayed a similar appearance when compared to imaging from October 2023. There was also a pleural-based nodule in the left upper lobe, and a partially resolved right lower lobe 1 cm nodule favoring inflammation.   For further management, the patient was referred to Dr. Tonia Brooms on 04/17/22. Dr. Tonia Brooms conveyed concern that the RUL nodule noted on her CT may represent an underlying malignancy in light of it's possible lobulated margins and overall size. Given these concerns, Dr. Tonia Brooms recommended proceeding with PET imaging and tentative bronchoscopy.   Subsequent PET scan on 05/08/22 further revealed hypermetabolic mediastinal, left hilar and left axillary lymph nodes concerning for  metastatic disease vs a lymphoproliferative disorder. PET also demonstrated the previously seen bilateral ground glass and solid pulmonary nodules, including the larger RUL nodule. The RUL nodule however showed no abnormal hypermetabolism.   In light of PET findings, the patient opted to proceed with bronchoscopy for biopsies of the RUL on 05/12/22 under Dr. Tonia Brooms. Biopsies of the RUL collected showed no evidence of malignancy. FNA of lymph node 11L also performed showed no evidence of malignancy.   Although her biopsies were negative for malignancy, Dr. Tonia Brooms recommended proceeding with biopsies of the hypermetabolic left axillary lymph node noted on her recent PET scan to complete her work-up.  Biopsy of the left axillary lymph node collected on 06/01/22 showed findings consistent with follicular lymphoma (non-Hodgkins), likely low-grade. Flow-cytometry showed evidence of a lambda restricted CD10 positive lymphoproliferative disorder (34% of gated cells).   She has been referred to medical oncology and is scheduled to meet with Dr. Candise Che on 06/18/22.   Patient is present with her supportive daughter and daughter-in-law. She reports to be doing well overall today. She denies any pain or discomfort in her left axilla. She denies recurrent fevers or drenching night sweats.   PREVIOUS RADIATION THERAPY: No  PAST MEDICAL HISTORY:  has a past medical history of Bradycardia, Colon polyp, Diabetes mellitus, Family history of anesthesia complication, GERD (gastroesophageal reflux disease), Hypercholesteremia, Hypertension, Kidney stone, Movement disorder, Osteoporosis, Peripheral neuropathy, Seasonal allergies, Sepsis, Tobacco use, and Tremors of nervous system.    PAST SURGICAL HISTORY: Past Surgical History:  Procedure Laterality Date   ABDOMINAL HYSTERECTOMY     APPENDECTOMY     bladder tacking  BRONCHIAL BIOPSY  05/12/2022   Procedure: BRONCHIAL BIOPSIES;  Surgeon: Josephine Igo, DO;   Location: [facility];  Service: Pulmonary;;   BRONCHIAL BRUSHINGS  05/12/2022   Procedure: BRONCHIAL BRUSHINGS;  Surgeon: Josephine Igo, DO;  Location: [facility];  Service: Pulmonary;;   BRONCHIAL NEEDLE ASPIRATION BIOPSY  05/12/2022   Procedure: BRONCHIAL NEEDLE ASPIRATION BIOPSIES;  Surgeon: Josephine Igo, DO;  Location: [facility];  Service: Pulmonary;;   CHOLECYSTECTOMY  1998   COLONOSCOPY     ENDOBRONCHIAL ULTRASOUND Bilateral 05/12/2022   Procedure: ENDOBRONCHIAL ULTRASOUND;  Surgeon: Josephine Igo, DO;  Location: [facility];  Service: Pulmonary;  Laterality: Bilateral;   FINE NEEDLE ASPIRATION  05/12/2022   Procedure: FINE NEEDLE ASPIRATION (FNA) LINEAR;  Surgeon: Josephine Igo, DO;  Location: [facility];  Service: Pulmonary;;   HERNIA REPAIR  05/2013   INGUINAL HERNIA REPAIR Right 06/27/2013   Procedure: RIGHT INGUINAL HERNIA REPAIR WITH MESH;  Surgeon: Velora Heckler, MD;  Location: [facility];  Service: General;  Laterality: Right;   INSERTION OF MESH Right 06/27/2013   Procedure: INSERTION OF MESH;  Surgeon: Velora Heckler, MD;  Location: [facility];  Service: General;  Laterality: Right;   MOUTH SURGERY     TONSILLECTOMY      FAMILY HISTORY: family history includes Breast cancer in her daughter and sister; Dementia in her mother and sister; Diabetes Mellitus II in her father; Prostate cancer in her father.  SOCIAL HISTORY:  reports that she quit smoking about 13 years ago. Her smoking use included cigarettes. She has never used smokeless tobacco. She reports that she does not drink alcohol and does not use drugs.  ALLERGIES: Nsaids, Other, Atenolol, Codeine, Lyrica [pregabalin], Simvastatin, and Sulfa antibiotics  MEDICATIONS:  Current Outpatient Medications  Medication Sig Dispense Refill   alendronate (FOSAMAX) 70 MG tablet Take 70 mg by mouth once a week. Take with a full glass of water on an empty stomach.      amLODipine-valsartan (EXFORGE) 10-160 MG tablet Take 1 tablet by mouth daily.     azelastine (OPTIVAR) 0.05 % ophthalmic solution Place 1 drop into both eyes 2 (two) times daily as needed (for allergies). (Patient not taking: Reported on 05/08/2022)     buPROPion (WELLBUTRIN XL) 150 MG 24 hr tablet Take 150 mg by mouth in the morning.     Cholecalciferol 50 MCG (2000 UT) CAPS Take 2,000 Units by mouth every evening.     donepezil (ARICEPT) 5 MG tablet Take 1 tablet (5 mg total) by mouth at bedtime. 90 tablet 3   famotidine (PEPCID) 20 MG tablet Take 20 mg by mouth daily as needed for heartburn.     fenofibrate (TRICOR) 145 MG tablet Take 145 mg by mouth daily.     glimepiride (AMARYL) 2 MG tablet Take 1 tablet (2 mg total) by mouth 2 (two) times daily. (Patient not taking: Reported on 05/08/2022)     loratadine (CLARITIN) 10 MG tablet Take 10 mg by mouth daily as needed for allergies (congestion).      memantine (NAMENDA) 10 MG tablet Take 1 tablet (10 mg total) by mouth 2 (two) times daily. 180 tablet 3   primidone (MYSOLINE) 50 MG tablet Take 2 tablets (100 mg total) by mouth in the morning and at bedtime. Indication: essential tremors 360 tablet 4   sertraline (ZOLOFT) 25 MG tablet Take 50 mg by mouth in the morning.     traZODone (DESYREL) 50 MG tablet  Take 50 mg by mouth at bedtime.     TRESIBA FLEXTOUCH 100 UNIT/ML SOPN FlexTouch Pen Inject 33 Units into the skin daily.  6   No current facility-administered medications for this encounter.    REVIEW OF SYSTEMS:  Notable for that above.   PHYSICAL EXAM:  height is 5' 5.5" (1.664 m) and weight is 149 lb 12.8 oz (67.9 kg). Her temperature is 97.5 F (36.4 C) (abnormal). Her blood pressure is 151/54 (abnormal) and her pulse is 54 (abnormal). Her respiration is 20 and oxygen saturation is 98%.   General: Alert, in no acute distress  HEENT: Head is normocephalic. Extraocular movements are intact. Oropharynx is clear. Neck: Neck is supple, no  palpable cervical or supraclavicular lymphadenopathy. Heart: Regular in rate and rhythm with no murmurs, rubs, or gallops. Chest: Clear to auscultation bilaterally, with no rhonchi, wheezes, or rales. Abdomen: Soft, nontender, nondistended, with no rigidity or guarding. Extremities: No cyanosis or edema. Lymphatics: hard, immobile left axillary mass; approximately 1 cm in diameter.  Skin: No concerning lesions. Musculoskeletal: symmetric strength and muscle tone throughout. Neurologic: Cranial nerves II through XII are grossly intact. No obvious focalities. Speech is fluent. Coordination is intact. Psychiatric:  Affect is appropriate. Short term memory loss notable.   ECOG = 1  0 - Asymptomatic (Fully active, able to carry on all predisease activities without restriction)  1 - Symptomatic but completely ambulatory (Restricted in physically strenuous activity but ambulatory and able to carry out work of a light or sedentary nature. For example, light housework, office work)  2 - Symptomatic, <50% in bed during the day (Ambulatory and capable of all self care but unable to carry out any work activities. Up and about more than 50% of waking hours)  3 - Symptomatic, >50% in bed, but not bedbound (Capable of only limited self-care, confined to bed or chair 50% or more of waking hours)  4 - Bedbound (Completely disabled. Cannot carry on any self-care. Totally confined to bed or chair)  5 - Death   Santiago Glad MM, Creech RH, Tormey DC, et al. [phone]). "Toxicity and response criteria of the [facility]". Am. Evlyn Clines. Oncol. 5 (6): 649-55   LABORATORY DATA:  Lab Results  Component Value Date   WBC 6.7 06/01/2022   HGB 11.7 (L) 06/01/2022   HCT 35.9 (L) 06/01/2022   MCV 89.8 06/01/2022   PLT 240 06/01/2022   CMP     Component Value Date/Time   NA 140 06/01/2022 1130   NA 142 06/05/2020 1404   K 4.1 06/01/2022 1130   CL 109 06/01/2022 1130   CO2 23 06/01/2022 1130    GLUCOSE 106 (H) 06/01/2022 1130   BUN 19 06/01/2022 1130   BUN 29 (H) 06/05/2020 1404   CREATININE 1.02 (H) 06/01/2022 1130   CALCIUM 9.4 06/01/2022 1130   PROT 6.0 (L) 11/24/2021 0929   PROT 6.6 06/05/2020 1404   ALBUMIN 3.0 (L) 11/24/2021 0929   ALBUMIN 4.4 06/05/2020 1404   AST 19 11/24/2021 0929   ALT 12 11/24/2021 0929   ALKPHOS 91 11/24/2021 0929   BILITOT 0.4 11/24/2021 0929   BILITOT 0.2 06/05/2020 1404   GFRNONAA 55 (L) 06/01/2022 1130   GFRAA 79 12/29/2017 1045         RADIOGRAPHY: [place] CORE BIOPSY (LYMPH NODES)  Result Date: 06/01/2022 INDICATION: 82 year old female with pulmonary nodules and hypermetabolic mediastinal, hilar and left axillary lymphadenopathy. She presents for ultrasound-guided core biopsy of the left axillary lymph  node. EXAM: ULTRASOUND BIOPSY OF THE LYMPH NODES MEDICATIONS: None. ANESTHESIA/SEDATION: None. FLUOROSCOPY TIME:  None. COMPLICATIONS: None immediate. PROCEDURE: Informed written consent was obtained from the patient after a thorough discussion of the procedural risks, benefits and alternatives. All questions were addressed. Maximal Sterile Barrier Technique was utilized including caps, mask, sterile gowns, sterile gloves, sterile drape, hand hygiene and skin antiseptic. A timeout was performed prior to the initiation of the procedure. The left axilla was interrogated with ultrasound. The abnormal lymph node adjacent to the axillary artery and vein was successfully identified. A suitable skin entry site was selected and marked. Local anesthesia was attained by infiltration with 1% lidocaine. A small dermatotomy was made. Under real-time ultrasound guidance, multiple 18 gauge core biopsies were obtained using the [employer] mission automated biopsy device. Biopsy specimens were placed on a moistened Telfa pad and placed in saline before being delivered to pathology for further analysis. Post biopsy ultrasound imaging demonstrates no evidence of complication. The  patient tolerated the procedure well. IMPRESSION: Ultrasound-guided core biopsy of abnormal left axillary lymph node. Electronically Signed   By: Malachy Moan M.D.   On: 06/01/2022 14:40      IMPRESSION/PLAN:  Follicular lymphoma grade I, lymph node of left axilla  It was a pleasure meeting this patient and her family today. Recent imaging and biopsy indicate lymphoma in the left axilla lymph node. Patient is a good candidate for radiation to the PET positive left axilla node (24Gy/12 fractions with curative intent).  Recent PET also showed hypermetabolic sites/ nodes in the chest. Biopsies showed no evidence of malignancy.  However, given her known lymphoma diagnosis, patient benefit from a short course of palliative radiation to the chest if any areas behave suspiciously in the future. I asked Dr. Tonia Brooms and Kandice Robinsons if they would consider seeing her back with repeat chest CT in 61mo rather than 12 mo. For now, I consider her to have Stage IA Follicular Lymphoma and will treat the axilla definitively.  She is scheduled to meet with Dr. Candise Che on 06/18/22 to discuss systemic therapy options. I sent a message with my tentative plans to Dr. Candise Che.  Today we talked about the natural course of lymphoma and the role radiation plays in treatment. We discussed the risks, benefits, and possible side effects that come with the treatment. Side effects include, but are not limited to, skin irritation, hair loss to the targeted areas, fatigue, or irritation to lung tissue. Patient and her family expressed understanding of the treatment and understand that this is a palliative treatment. All of their questions were answered.  A consent form was signed and placed in her chart.  Patient will be scheduled for CT simulation with plans to being radiation treatment approximately 1 week later.   On date of service, in total, I spent 60 minutes on this encounter. Patient was seen in  person. __________________________________________   Joyice Faster, PA-C    Lonie Peak, MD  This document serves as a record of services personally performed by Lonie Peak, MD. It was created on her behalf by Neena Rhymes, a trained medical scribe. The creation of this record is based on the scribe's personal observations and the provider's statements to them. This document has been checked and approved by the attending provider.

## 2022-06-16 ENCOUNTER — Encounter: Payer: Self-pay | Admitting: Radiation Oncology

## 2022-06-16 ENCOUNTER — Ambulatory Visit
Admission: RE | Admit: 2022-06-16 | Discharge: 2022-06-16 | Disposition: A | Discharge status: Home / Self Care | Payer: Medicare Other | Source: Ambulatory Visit | Attending: Radiation Oncology | Admitting: Radiation Oncology

## 2022-06-16 VITALS — BP 151/54 | HR 54 | Temp 97.5°F | Resp 20 | Ht 65.5 in | Wt 149.8 lb

## 2022-06-16 DIAGNOSIS — Z794 Long term (current) use of insulin: Secondary | ICD-10-CM | POA: Insufficient documentation

## 2022-06-16 DIAGNOSIS — M810 Age-related osteoporosis without current pathological fracture: Secondary | ICD-10-CM | POA: Insufficient documentation

## 2022-06-16 DIAGNOSIS — F0280 Dementia in other diseases classified elsewhere without behavioral disturbance: Secondary | ICD-10-CM | POA: Diagnosis not present

## 2022-06-16 DIAGNOSIS — Z7984 Long term (current) use of oral hypoglycemic drugs: Secondary | ICD-10-CM | POA: Diagnosis not present

## 2022-06-16 DIAGNOSIS — C8204 Follicular lymphoma grade I, lymph nodes of axilla and upper limb: Secondary | ICD-10-CM | POA: Insufficient documentation

## 2022-06-16 DIAGNOSIS — G309 Alzheimer's disease, unspecified: Secondary | ICD-10-CM | POA: Insufficient documentation

## 2022-06-16 DIAGNOSIS — G629 Polyneuropathy, unspecified: Secondary | ICD-10-CM | POA: Diagnosis not present

## 2022-06-16 DIAGNOSIS — Z9049 Acquired absence of other specified parts of digestive tract: Secondary | ICD-10-CM | POA: Diagnosis not present

## 2022-06-16 DIAGNOSIS — E119 Type 2 diabetes mellitus without complications: Secondary | ICD-10-CM | POA: Insufficient documentation

## 2022-06-16 DIAGNOSIS — R918 Other nonspecific abnormal finding of lung field: Secondary | ICD-10-CM | POA: Diagnosis not present

## 2022-06-16 DIAGNOSIS — Z79899 Other long term (current) drug therapy: Secondary | ICD-10-CM | POA: Diagnosis not present

## 2022-06-16 DIAGNOSIS — R001 Bradycardia, unspecified: Secondary | ICD-10-CM | POA: Insufficient documentation

## 2022-06-16 DIAGNOSIS — Z87891 Personal history of nicotine dependence: Secondary | ICD-10-CM | POA: Diagnosis not present

## 2022-06-16 DIAGNOSIS — Z803 Family history of malignant neoplasm of breast: Secondary | ICD-10-CM | POA: Diagnosis not present

## 2022-06-17 NOTE — Progress Notes (Signed)
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  HISTORY OF PRESENTING ILLNESS:   Rebecca Flores is a wonderful 82 y.o. female who has been referred to [place] by Emilio Aspen, MD for evaluation and management of follicular lymphoma.  Today, she is accompanied by her daughter-in law. Her daughter-in-law notes that she does have some dementia. She reports that she was infected with COVID-19 in fall, and later developed pneumonia. CT scan revealed pulmonary nodule. Her daughter-in-law denies any residual symptoms but notes that her dementia has worsened since the COVID-19 infection.   She denies any unexplained fever, chill, night sweats, fatigue, new lumps/bumps, change in bowel habits, leg swelling, back pain, or hand swelling. She reports that the lymph node in her axilla is not too noticeable.   She does report that her appetite has suddenly mildly worsened, which she attributes to her age. She denies any sudden weight loss.   Patient does endorse swelling and redness in her left upper eyelid since last night. The area is itchy and her daughter-in-law believes that it may be due to contact with poison ivy.   MEDICAL HISTORY:  Past Medical History:  Diagnosis Date   Bradycardia    Colon polyp    Diabetes mellitus    Family history of anesthesia complication    sister hard to wake up   GERD (gastroesophageal reflux disease)    Hypercholesteremia    Hypertension    Kidney stone    Movement disorder    Osteoporosis    Peripheral neuropathy    Seasonal allergies    Sepsis    Tobacco use    Tremors of nervous system     SURGICAL HISTORY: Past Surgical History:  Procedure Laterality Date   ABDOMINAL HYSTERECTOMY     APPENDECTOMY      bladder tacking     BRONCHIAL BIOPSY  05/12/2022   Procedure: BRONCHIAL BIOPSIES;  Surgeon: Josephine Igo, DO;  Location: [facility];  Service: Pulmonary;;   BRONCHIAL BRUSHINGS  05/12/2022   Procedure: BRONCHIAL BRUSHINGS;  Surgeon: Josephine Igo, DO;  Location: [facility];  Service: Pulmonary;;   BRONCHIAL NEEDLE ASPIRATION BIOPSY  05/12/2022   Procedure: BRONCHIAL NEEDLE ASPIRATION BIOPSIES;  Surgeon: Josephine Igo, DO;  Location: [facility];  Service: Pulmonary;;   CHOLECYSTECTOMY  1998   COLONOSCOPY     ENDOBRONCHIAL ULTRASOUND Bilateral 05/12/2022   Procedure: ENDOBRONCHIAL ULTRASOUND;  Surgeon: Josephine Igo, DO;  Location: [facility];  Service: Pulmonary;  Laterality: Bilateral;   FINE NEEDLE ASPIRATION  05/12/2022   Procedure: FINE NEEDLE ASPIRATION (FNA) LINEAR;  Surgeon: Josephine Igo, DO;  Location: [facility];  Service: Pulmonary;;   HERNIA REPAIR  05/2013   INGUINAL HERNIA REPAIR Right 06/27/2013   Procedure: RIGHT INGUINAL HERNIA REPAIR WITH MESH;  Surgeon: Velora Heckler, MD;  Location: [facility];  Service: General;  Laterality: Right;   INSERTION OF MESH Right 06/27/2013   Procedure: INSERTION OF MESH;  Surgeon: Velora Heckler, MD;  Location: [facility];  Service: General;  Laterality: Right;   MOUTH SURGERY     TONSILLECTOMY      SOCIAL HISTORY: Social History   Socioeconomic History   Marital status: Widowed    Spouse name: Not on file   Number of children:  2   Years of education: 9TH   Highest education level: Not on file  Occupational History    Employer: RETIRED  Tobacco Use   Smoking status: Former    Types: Cigarettes    Quit date: 04/23/2009    Years since quitting: 13.1   Smokeless tobacco: Never  Vaping Use   Vaping Use: Never used  Substance and Sexual Activity   Alcohol use: No    Alcohol/week: 1.0 standard drink of alcohol    Types: 1 Glasses of wine per week   Drug use: No   Sexual activity: Not  Currently    Birth control/protection: Surgical  Other Topics Concern   Not on file  Social History Narrative   Patient is widowed with 2 children   09/07/16 Lives alone   Patient has a 9th grade education   Patient is right handed   Patient does not drink caffeine   Social Determinants of Health   Financial Resource Strain: Not on file  Food Insecurity: No Food Insecurity (06/16/2022)   Hunger Vital Sign    Worried About Running Out of Food in the Last Year: Never true    Ran Out of Food in the Last Year: Never true  Transportation Needs: No Transportation Needs (06/16/2022)   PRAPARE - Administrator, Civil Service (Medical): No    Lack of Transportation (Non-Medical): No  Physical Activity: Not on file  Stress: Not on file  Social Connections: Not on file  Intimate Partner Violence: Not At Risk (06/16/2022)   Humiliation, Afraid, Rape, and Kick questionnaire    Fear of Current or Ex-Partner: No    Emotionally Abused: No    Physically Abused: No    Sexually Abused: No    FAMILY HISTORY: Family History  Problem Relation Age of Onset   Dementia Mother    Diabetes Mellitus II Father    Prostate cancer Father    Dementia Sister    Breast cancer Sister    Breast cancer Daughter     ALLERGIES:  is allergic to nsaids, other, atenolol, codeine, lyrica [pregabalin], simvastatin, and sulfa antibiotics.  MEDICATIONS:  Current Outpatient Medications  Medication Sig Dispense Refill   alendronate (FOSAMAX) 70 MG tablet Take 70 mg by mouth once a week. Take with a full glass of water on an empty stomach.     amLODipine-valsartan (EXFORGE) 10-160 MG tablet Take 1 tablet by mouth daily.     azelastine (OPTIVAR) 0.05 % ophthalmic solution Place 1 drop into both eyes 2 (two) times daily as needed (for allergies). (Patient not taking: Reported on 05/08/2022)     buPROPion (WELLBUTRIN XL) 150 MG 24 hr tablet Take 150 mg by mouth in the morning.     Cholecalciferol 50 MCG (2000  UT) CAPS Take 2,000 Units by mouth every evening.     donepezil (ARICEPT) 5 MG tablet Take 1 tablet (5 mg total) by mouth at bedtime. 90 tablet 3   famotidine (PEPCID) 20 MG tablet Take 20 mg by mouth daily as needed for heartburn.     fenofibrate (TRICOR) 145 MG tablet Take 145 mg by mouth daily.     glimepiride (AMARYL) 2 MG tablet Take 1 tablet (2 mg total) by mouth 2 (two) times daily. (Patient not taking: Reported on 05/08/2022)     loratadine (CLARITIN) 10 MG tablet Take 10 mg by mouth daily as needed for allergies (congestion).      memantine (NAMENDA) 10 MG tablet Take 1 tablet (10  mg total) by mouth 2 (two) times daily. 180 tablet 3   primidone (MYSOLINE) 50 MG tablet Take 2 tablets (100 mg total) by mouth in the morning and at bedtime. Indication: essential tremors 360 tablet 4   sertraline (ZOLOFT) 25 MG tablet Take 50 mg by mouth in the morning.     traZODone (DESYREL) 50 MG tablet Take 50 mg by mouth at bedtime.     TRESIBA FLEXTOUCH 100 UNIT/ML SOPN FlexTouch Pen Inject 33 Units into the skin daily.  6   No current facility-administered medications for this visit.    REVIEW OF SYSTEMS:    10 Point review of Systems was done is negative except as noted above.  PHYSICAL EXAMINATION: ECOG PERFORMANCE STATUS: 2 - Symptomatic, <50% confined to bed  .There were no vitals filed for this visit. There were no vitals filed for this visit. .There is no height or weight on file to calculate BMI.  GENERAL:alert, in no acute distress and comfortable SKIN: no acute rashes, no significant lesions EYES: conjunctiva are pink and non-injected, sclera anicteric OROPHARYNX: MMM, no exudates, no oropharyngeal erythema or ulceration NECK: supple, no JVD LYMPH:  no palpable lymphadenopathy in the cervical, axillary or inguinal regions LUNGS: clear to auscultation b/l with normal respiratory effort HEART: regular rate & rhythm ABDOMEN:  normoactive bowel sounds , non tender, not  distended. Extremity: no pedal edema PSYCH: alert & oriented x 3 with fluent speech NEURO: no focal motor/sensory deficits  LABORATORY DATA:  I have reviewed the data as listed  .    Latest Ref Rng & Units 06/01/2022   11:30 AM 05/12/2022    9:15 AM 11/25/2021    3:59 AM  CBC  WBC 4.0 - 10.5 K/uL 6.7  7.8  7.1   Hemoglobin 12.0 - 15.0 g/dL 60.4  54.0  98.1   Hematocrit 36.0 - 46.0 % 35.9  38.1  30.1   Platelets 150 - 400 K/uL 240  246  214     .    Latest Ref Rng & Units 06/01/2022   11:30 AM 05/12/2022    9:15 AM 11/26/2021    2:59 AM  CMP  Glucose 70 - 99 mg/dL 191  92  74   BUN 8 - 23 mg/dL 19  15  26    Creatinine 0.44 - 1.00 mg/dL 4.78  2.95  6.21   Sodium 135 - 145 mmol/L 140  137  135   Potassium 3.5 - 5.1 mmol/L 4.1  3.9  4.6   Chloride 98 - 111 mmol/L 109  107  107   CO2 22 - 32 mmol/L 23  21  20    Calcium 8.9 - 10.3 mg/dL 9.4  9.2  8.8      RADIOGRAPHIC STUDIES: I have personally reviewed the radiological images as listed and agreed with the findings in the report. [place] CORE BIOPSY (LYMPH NODES)  Result Date: 06/01/2022 INDICATION: 82 year old female with pulmonary nodules and hypermetabolic mediastinal, hilar and left axillary lymphadenopathy. She presents for ultrasound-guided core biopsy of the left axillary lymph node. EXAM: ULTRASOUND BIOPSY OF THE LYMPH NODES MEDICATIONS: None. ANESTHESIA/SEDATION: None. FLUOROSCOPY TIME:  None. COMPLICATIONS: None immediate. PROCEDURE: Informed written consent was obtained from the patient after a thorough discussion of the procedural risks, benefits and alternatives. All questions were addressed. Maximal Sterile Barrier Technique was utilized including caps, mask, sterile gowns, sterile gloves, sterile drape, hand hygiene and skin antiseptic. A timeout was performed prior to the initiation of the procedure. The left  axilla was interrogated with ultrasound. The abnormal lymph node adjacent to the axillary artery and vein was  successfully identified. A suitable skin entry site was selected and marked. Local anesthesia was attained by infiltration with 1% lidocaine. A small dermatotomy was made. Under real-time ultrasound guidance, multiple 18 gauge core biopsies were obtained using the [employer] mission automated biopsy device. Biopsy specimens were placed on a moistened Telfa pad and placed in saline before being delivered to pathology for further analysis. Post biopsy ultrasound imaging demonstrates no evidence of complication. The patient tolerated the procedure well. IMPRESSION: Ultrasound-guided core biopsy of abnormal left axillary lymph node. Electronically Signed   By: Malachy Moan M.D.   On: 06/01/2022 14:40     Needle core biopsy 06/01/2022:     ASSESSMENT & PLAN:   Wonderful 82 y.o. female with:  Follicular lymphoma grade I, lymph node of left axilla   PLAN:  -Discussed lab results on 06/01/2022 in detail with patient. CBC showed WBC of 6.7K, hemoglobin of 11.7, and platelets of 240K. -discussed PET scan 05/08/2022 which revealed left axillary lymph nodes as well as lymph nodes in central chest which is consistent with stage 2A follicular lymphoma.  -discussed details of grade 1 follicular lymphoma and details of staging of disease -discussed details of categorization and spectrum of non-hodgkin's lymphomas -patient has no constitutional symptoms at this time -do not suggest bone marrow biopsy at this time -would suggest continuing to monitor disease without initiating treatment at this time -discussed reasons to initiate treatment such as large bulky lymph nodes affecting organs, constitutional symptoms, abnormal blood counts, or bulky disease -answered all of patient's and her daughter-in-law's questions in detail -will repeat scans and blood tests in 6 months -recommend patient to consume a well-balanced diet -recommend patient to drink at least 2L of water daily -Recommend OTC anti-inflammatory eye  drop or hydrocortisone to reduce inflammation in her eye.  FOLLOW-UP: RTC with Dr Candise Che with labs in 6 months  The total time spent in the appointment was 60 minutes* .  All of the patient's questions were answered with apparent satisfaction. The patient knows to call the clinic with any problems, questions or concerns.   Wyvonnia Lora MD MS AAHIVMS [facility] Hematology/Oncology Physician [facility]  .*Total Encounter Time as defined by the Centers for Medicare and Medicaid Services includes, in addition to the face-to-face time of a patient visit (documented in the note above) non-face-to-face time: obtaining and reviewing outside history, ordering and reviewing medications, tests or procedures, care coordination (communications with other health care professionals or caregivers) and documentation in the medical record.   I,Mitra Faeizi,acting as a Neurosurgeon for Wyvonnia Lora, MD.,have documented all relevant documentation on the behalf of Wyvonnia Lora, MD,as directed by  Wyvonnia Lora, MD while in the presence of Wyvonnia Lora, MD.  .I have reviewed the above documentation for accuracy and completeness, and I agree with the above. Johney Maine MD

## 2022-06-18 ENCOUNTER — Inpatient Hospital Stay: Payer: Medicare Other

## 2022-06-18 ENCOUNTER — Other Ambulatory Visit: Payer: Self-pay

## 2022-06-18 ENCOUNTER — Inpatient Hospital Stay: Payer: Medicare Other | Attending: Hematology | Admitting: Hematology

## 2022-06-18 VITALS — BP 174/45 | HR 53 | Temp 97.9°F | Resp 20 | Wt 153.5 lb

## 2022-06-18 DIAGNOSIS — Z803 Family history of malignant neoplasm of breast: Secondary | ICD-10-CM | POA: Diagnosis not present

## 2022-06-18 DIAGNOSIS — Z87891 Personal history of nicotine dependence: Secondary | ICD-10-CM | POA: Diagnosis not present

## 2022-06-18 DIAGNOSIS — C8204 Follicular lymphoma grade I, lymph nodes of axilla and upper limb: Secondary | ICD-10-CM | POA: Diagnosis not present

## 2022-06-18 DIAGNOSIS — C8208 Follicular lymphoma grade I, lymph nodes of multiple sites: Secondary | ICD-10-CM

## 2022-06-18 DIAGNOSIS — Z8042 Family history of malignant neoplasm of prostate: Secondary | ICD-10-CM

## 2022-06-18 DIAGNOSIS — Z90710 Acquired absence of both cervix and uterus: Secondary | ICD-10-CM

## 2022-06-19 ENCOUNTER — Telehealth: Payer: Self-pay | Admitting: Hematology

## 2022-06-25 ENCOUNTER — Encounter: Payer: Self-pay | Admitting: Radiation Oncology

## 2022-06-25 NOTE — Progress Notes (Signed)
I discussed Ms. Rebecca Flores with Dr. Candise Che.  He and patient/family discussed observation for her lymphoma and all parties feel good about that plan. Therefore, I will see her back PRN and she will be monitored in his clinic. -----------------------------------  Lonie Peak, MD

## 2022-07-01 ENCOUNTER — Other Ambulatory Visit: Payer: Self-pay | Admitting: Acute Care

## 2022-07-16 DIAGNOSIS — E119 Type 2 diabetes mellitus without complications: Secondary | ICD-10-CM | POA: Diagnosis not present

## 2022-07-16 DIAGNOSIS — H35363 Drusen (degenerative) of macula, bilateral: Secondary | ICD-10-CM | POA: Diagnosis not present

## 2022-07-16 DIAGNOSIS — H524 Presbyopia: Secondary | ICD-10-CM | POA: Diagnosis not present

## 2022-07-16 DIAGNOSIS — H35033 Hypertensive retinopathy, bilateral: Secondary | ICD-10-CM | POA: Diagnosis not present

## 2022-07-16 DIAGNOSIS — H11153 Pinguecula, bilateral: Secondary | ICD-10-CM | POA: Diagnosis not present

## 2022-08-19 DIAGNOSIS — R21 Rash and other nonspecific skin eruption: Secondary | ICD-10-CM | POA: Diagnosis not present

## 2022-10-14 ENCOUNTER — Ambulatory Visit: Payer: Medicare Other | Admitting: Neurology

## 2022-10-14 ENCOUNTER — Encounter: Payer: Self-pay | Admitting: Neurology

## 2022-10-14 VITALS — BP 160/70 | HR 53 | Ht 65.5 in | Wt 149.0 lb

## 2022-10-14 DIAGNOSIS — F03B4 Unspecified dementia, moderate, with anxiety: Secondary | ICD-10-CM | POA: Diagnosis not present

## 2022-10-14 DIAGNOSIS — G250 Essential tremor: Secondary | ICD-10-CM

## 2022-10-14 DIAGNOSIS — F0390 Unspecified dementia without behavioral disturbance: Secondary | ICD-10-CM | POA: Insufficient documentation

## 2022-10-14 MED ORDER — MEMANTINE HCL 10 MG PO TABS: 10.0000 mg | ORAL_TABLET | Freq: Two times a day (BID) | ORAL | 3 refills | Status: DC

## 2022-10-14 MED ORDER — PRIMIDONE 50 MG PO TABS: 100.0000 mg | ORAL_TABLET | Freq: Two times a day (BID) | ORAL | 4 refills | Status: DC

## 2022-10-14 MED ORDER — DONEPEZIL HCL 5 MG PO TABS: 5.0000 mg | ORAL_TABLET | Freq: Every day | ORAL | 3 refills | Status: AC

## 2022-10-14 NOTE — Patient Instructions (Signed)
Continue the primidone, aricept, namenda  Check primidone level  Recommend against driving Recommend exercise, brain stimulating activity, healthy eating Follow up in 1 year

## 2022-10-14 NOTE — Progress Notes (Signed)
HISTORY OF PRESENT ILLNESS  [name], Rebecca Flores been followed here since June 2009 for tremor felt to represent essential tremor, and previously followed by Dr. Thad Ranger. Tremor also involves the head and to a lesser extent the hands. She has been treated with primidone with improvement. She is independent in all activities of daily living and she drives a car without problems.Her father suffered Parkinson's disease, mother died of Alzheimer's disease, there was no tremors in her siblings, she is now taking primidone 50 mg three times daily at 830, 1230 and 7:30 pm.She denies significant side effects from the medications.     UPDATE September 05 2014:She was diagnosed with diabetes around 2014, she now complains of bilateral toes, feet paresthesia, burning discomfort, difficulty falling to sleep because of her feet pain,  She has tried gabapentin without helping her symptoms, she denies gait difficulty  UPDATE August 6th 2019: She complains of depression, chronic insomnia, difficulty sleeping for 2 months. She cries over everything, she lives by herself,    In addition, she complains of 2 years history of bowel and bladder incontinence, gradually getting worse, to the point she is afraid to go out, went to take a long walk, often comes back with incontinence, she has mild bilateral feet paresthesia, unsteady gait   She continue to take primidone 50 mg 3 times for her tremor, which has helped her symptoms   UPDATE Nov 6th 2019: She is accompanied by her daughter at today's clinical visit. Essential tremor: primidone 50mg  tid does help her symptoms, Depression: celexa 20mg  every day was helping her symptoms, but she recently began to take it every other day, daughter noticed worsening anxiety.   Peripheral neuropathy: She continue have bilateral feet paresthesia, numbness tingling, urge to move, mostly at night, sometimes she sleeps with her sock on, she was taking nortriptyline 10 mg, 2 tablets every  night which help her sleep better.,  Her mother suffered dementia, she was noted to have gradual worsening memory loss, today's [place] examination was 20 out of 30   UPDATE October 04 2018: She is accompanied by her daughter-in-law at today's clinical visit, Essential tremor, she is taking primidone 50 mg 3 times a day, worst time is in the morning time," shake like a leaf", she did notice mild improvement after taking medication,   She also complains of worsening memory loss, she has been taking nortriptyline 50 mg at bedtime to help her sleep, and bilateral feet paresthesia  UPDATE September 20 2019: She continues to be bothered by her slow worsening essential tremor, involving upper extremity, and also has frequent head titubation, tried propanolol, could not tolerate it, did used to primidone, went back on it, used to take 50 mg 2 tablets in the morning, 1 tablet at night, when trying to take 3 times a day, she felt the morning dose was not enough, sometimes she becomes so shaky, also with associated anxiety, occasionally hypoglycemia episode  She still lives by herself, still driving, slow worsening memory loss, does not want to take Namenda  Update June 05, 2020 SS: Here today with her daughter and daughter in law, remains on primidone 100 mg twice daily. Lives alone, does everything on her own, drives well.  Claims not sleeping well, has been out of melatonin.  Stopped taking Namenda last year, did not start Aricept due to fear of diarrhea.  Continues with anxiety, certain things get her very upset. Has fallen victim to several spam calls, lost money, daughters are changing  her phone #.  Tremor worsens over time involving both upper extremities, head and neck tremor.   Update April 08, 2021 SS: Here today with daughter, Steward Drone. Lives alone, does this well, she drives, she has a boyfriend, he stresses her out, worries about his aging health. With any anxiety or stress, she'll start crying, have  panic attack. People try to call and scam her, worries about this. Is a people pleasing person. Tremor continues, head, jaw, both hands, not sure if taking Primidone 50 mg, 2 tablets twice daily. I was able to see PCP notes, added Zoloft, Trazodone, Wellbutrin. She stopped Celexa, but isn't taking Zoloft. On Aricept 5 mg, Namenda 10 mg BID. MMSE 22/30.  Update October 08, 2021 SS: [facility] 16/30. Here with daughter in law, [name]. Still has boyfriend. Lives alone, drives, kids are close by. Has trouble keeping up with medications, daughter helps. On Aricept 5 mg daily, Namenda 10 mg twice daily. No major changes. Even with calendar will ask about reminders. Is very careful not to answer phone to prevent scans. Tremors are ok, primidone really helps. No falls. Looks good today.   Update October 14, 2022 SS: [facility] 14/30. Here with DIL [name], daughter Steward Drone. Living alone, is driving mostly with someone else, calls her family if she goes out. Does her own ADLs, family manages medications. Tremor in hands, primidone helps a lot. On Aricept 5 mg daily, Namenda 10 mg BID. Had COVID a year ago, noticed worsening cognitive functioning, gets confused with her mail, causing anxiety. Family has taken over checkbook. She wants to stay at her home. 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   REVIEW OF SYSTEMS: Out of a complete 14 system review of symptoms, the patient complains only of the following symptoms, and all other reviewed systems are negative.  See HPI  ALLERGIES: Allergies  Allergen Reactions   Nsaids     Severe facial swelling/rash Pt takes "advil and ibuprophen without problems"   Other Rash    Seldane   Atenolol     Hair loss    Codeine Nausea Only    Nausea    Lyrica [Pregabalin]     Constipation, swelling   Simvastatin     Leg cramps   Sulfa Antibiotics     Pruritis      HOME MEDICATIONS: Outpatient Medications Prior to Visit   Medication Sig Dispense Refill   alendronate (FOSAMAX) 70 MG tablet Take 70 mg by mouth once a week. Take with a full glass of water on an empty stomach.     amLODipine-valsartan (EXFORGE) 10-160 MG tablet Take 1 tablet by mouth daily.     azelastine (OPTIVAR) 0.05 % ophthalmic solution Place 1 drop into both eyes 2 (two) times daily as needed (for allergies).     buPROPion (WELLBUTRIN XL) 150 MG 24 hr tablet Take 150 mg by mouth in the morning.     famotidine (PEPCID) 20 MG tablet Take 20 mg by mouth daily as needed for heartburn.     fenofibrate (TRICOR) 145 MG tablet Take 145 mg by mouth daily.     loratadine (CLARITIN) 10 MG tablet Take 10 mg by mouth daily as needed for allergies (congestion).      sertraline (ZOLOFT) 25 MG tablet Take 50 mg by mouth in the morning.     traZODone (DESYREL) 50 MG tablet Take 50 mg by mouth at bedtime.     TRESIBA FLEXTOUCH 100 UNIT/ML  SOPN FlexTouch Pen Inject 33 Units into the skin daily.  6   donepezil (ARICEPT) 5 MG tablet Take 1 tablet (5 mg total) by mouth at bedtime. 90 tablet 3   memantine (NAMENDA) 10 MG tablet Take 1 tablet (10 mg total) by mouth 2 (two) times daily. 180 tablet 3   primidone (MYSOLINE) 50 MG tablet Take 2 tablets (100 mg total) by mouth in the morning and at bedtime. Indication: essential tremors 360 tablet 4   Cholecalciferol 50 MCG (2000 UT) CAPS Take 2,000 Units by mouth every evening.     glimepiride (AMARYL) 2 MG tablet Take 1 tablet (2 mg total) by mouth 2 (two) times daily. (Patient not taking: Reported on 05/08/2022)     No facility-administered medications prior to visit.    PAST MEDICAL HISTORY: Past Medical History:  Diagnosis Date   Bradycardia    Colon polyp    Diabetes mellitus    Family history of anesthesia complication    sister hard to wake up   GERD (gastroesophageal reflux disease)    Hypercholesteremia    Hypertension    Kidney stone    Movement disorder    Osteoporosis    Peripheral neuropathy     Seasonal allergies    Sepsis (HCC)    Tobacco use    Tremors of nervous system     PAST SURGICAL HISTORY: Past Surgical History:  Procedure Laterality Date   ABDOMINAL HYSTERECTOMY     APPENDECTOMY     bladder tacking     BRONCHIAL BIOPSY  05/12/2022   Procedure: BRONCHIAL BIOPSIES;  Surgeon: Josephine Igo, DO;  Location: [facility];  Service: Pulmonary;;   BRONCHIAL BRUSHINGS  05/12/2022   Procedure: BRONCHIAL BRUSHINGS;  Surgeon: Josephine Igo, DO;  Location: [facility];  Service: Pulmonary;;   BRONCHIAL NEEDLE ASPIRATION BIOPSY  05/12/2022   Procedure: BRONCHIAL NEEDLE ASPIRATION BIOPSIES;  Surgeon: Josephine Igo, DO;  Location: [facility];  Service: Pulmonary;;   CHOLECYSTECTOMY  1998   COLONOSCOPY     ENDOBRONCHIAL ULTRASOUND Bilateral 05/12/2022   Procedure: ENDOBRONCHIAL ULTRASOUND;  Surgeon: Josephine Igo, DO;  Location: [facility];  Service: Pulmonary;  Laterality: Bilateral;   FINE NEEDLE ASPIRATION  05/12/2022   Procedure: FINE NEEDLE ASPIRATION (FNA) LINEAR;  Surgeon: Josephine Igo, DO;  Location: [facility];  Service: Pulmonary;;   HERNIA REPAIR  05/2013   INGUINAL HERNIA REPAIR Right 06/27/2013   Procedure: RIGHT INGUINAL HERNIA REPAIR WITH MESH;  Surgeon: Velora Heckler, MD;  Location: [facility];  Service: General;  Laterality: Right;   INSERTION OF MESH Right 06/27/2013   Procedure: INSERTION OF MESH;  Surgeon: Velora Heckler, MD;  Location: [facility];  Service: General;  Laterality: Right;   MOUTH SURGERY     TONSILLECTOMY      FAMILY HISTORY: Family History  Problem Relation Age of Onset   Dementia Mother    Diabetes Mellitus II Father    Prostate cancer Father    Dementia Sister    Breast cancer Sister    Breast cancer Daughter     SOCIAL HISTORY: Social History   Socioeconomic History   Marital status: Widowed    Spouse name: Not on file   Number of children: 2   Years of education: 9TH   Highest  education level: Not on file  Occupational History    Employer: RETIRED  Tobacco Use   Smoking status: Former    Current packs/day: 0.00  Types: Cigarettes    Quit date: 04/23/2009    Years since quitting: 13.4   Smokeless tobacco: Never  Vaping Use   Vaping status: Never Used  Substance and Sexual Activity   Alcohol use: No    Alcohol/week: 1.0 standard drink of alcohol    Types: 1 Glasses of wine per week   Drug use: No   Sexual activity: Not Currently    Birth control/protection: Surgical  Other Topics Concern   Not on file  Social History Narrative   Patient is widowed with 2 children   09/07/16 Lives alone   Patient has a 9th grade education   Patient is right handed   Patient does not drink caffeine   Social Determinants of Health   Financial Resource Strain: Not on file  Food Insecurity: No Food Insecurity (06/16/2022)   Hunger Vital Sign    Worried About Running Out of Food in the Last Year: Never true    Ran Out of Food in the Last Year: Never true  Transportation Needs: No Transportation Needs (06/16/2022)   PRAPARE - Administrator, Civil Service (Medical): No    Lack of Transportation (Non-Medical): No  Physical Activity: Not on file  Stress: Not on file  Social Connections: Not on file  Intimate Partner Violence: Not At Risk (06/16/2022)   Humiliation, Afraid, Rape, and Kick questionnaire    Fear of Current or Ex-Partner: No    Emotionally Abused: No    Physically Abused: No    Sexually Abused: No   PHYSICAL EXAM  Vitals:   10/14/22 1032 10/14/22 1112  BP: (!) 204/55 (!) 160/70  Pulse: (!) 53   Weight: 149 lb (67.6 kg)   Height: 5' 5.5" (1.664 m)    Body mass index is 24.42 kg/m.  Generalized: Well developed, in no acute distress     04/08/2021    2:17 PM 06/05/2020    1:00 PM 09/20/2019    2:28 PM  MMSE - Mini Mental State Exam  Orientation to time 4 3 4   Orientation to Place 5 5 4   Registration 3 3 3   Attention/ Calculation 0 0  0  Recall 1 1 0  Language- name 2 objects 2 2 2   Language- repeat 1 1 1   Language- follow 3 step command 3 3 3   Language- read & follow direction 1 1 1   Write a sentence 1 1 1   Copy design 1 1 1   Total score 22 21 20       10/14/2022   10:38 AM 10/08/2021    9:20 AM 12/31/2017    9:00 AM  Montreal Cognitive Assessment   Visuospatial/ Executive (0/5) 3 3 4   Naming (0/3) 2 1 2   Attention: Read list of digits (0/2) 2 1 2   Attention: Read list of letters (0/1) 1 1 1   Attention: Serial 7 subtraction starting at 100 (0/3) 1 2 0  Language: Repeat phrase (0/2) 1 1 2   Language : Fluency (0/1) 0 0 1  Abstraction (0/2) 2 2 2   Delayed Recall (0/5) 0 0 0  Orientation (0/6) 2 4 6   Total 14 15 20   Adjusted Score (based on education)  16    Physical Exam  General: The patient is alert and cooperative at the time of the examination.  She provides most all of her history. In a good mood  Skin: No significant peripheral edema is noted.  Neurologic Exam  Mental status: The patient is alert, fairly good historian .  The patient has apparent normal recent and remote memory, with an apparently normal attention span and concentration ability.  Cranial nerves: Facial symmetry is present. Speech is normal, no aphasia or dysarthria is noted. Extraocular movements are full. Visual fields are full. Head tremor, jaw tremor noted, mild-moderate.  Motor: The patient has good strength in all 4 extremities.   Sensory examination: Soft touch sensation is symmetric on the face, arms, and legs.  Coordination: The patient has good finger-nose-finger and heel-to-shin bilaterally.  Mild to moderate tremor with finger-nose-finger, greater on the left than the right. .   Gait and station: The patient has a normal gait.  Is independent  Reflexes: Deep tendon reflexes are symmetric.  DIAGNOSTIC DATA (LABS, IMAGING, TESTING) - I reviewed patient records, labs, notes, testing and imaging myself where  available.  Lab Results  Component Value Date   WBC 6.7 06/01/2022   HGB 11.7 (L) 06/01/2022   HCT 35.9 (L) 06/01/2022   MCV 89.8 06/01/2022   PLT 240 06/01/2022      Component Value Date/Time   NA 140 06/01/2022 1130   NA 142 06/05/2020 1404   K 4.1 06/01/2022 1130   CL 109 06/01/2022 1130   CO2 23 06/01/2022 1130   GLUCOSE 106 (H) 06/01/2022 1130   BUN 19 06/01/2022 1130   BUN 29 (H) 06/05/2020 1404   CREATININE 1.02 (H) 06/01/2022 1130   CALCIUM 9.4 06/01/2022 1130   PROT 6.0 (L) 11/24/2021 0929   PROT 6.6 06/05/2020 1404   ALBUMIN 3.0 (L) 11/24/2021 0929   ALBUMIN 4.4 06/05/2020 1404   AST 19 11/24/2021 0929   ALT 12 11/24/2021 0929   ALKPHOS 91 11/24/2021 0929   BILITOT 0.4 11/24/2021 0929   BILITOT 0.2 06/05/2020 1404   GFRNONAA 55 (L) 06/01/2022 1130   GFRAA 79 12/29/2017 1045   Lab Results  Component Value Date   CHOL 147 04/25/2011   HDL 33 (L) 04/25/2011   LDLCALC 53 04/25/2011   TRIG 307 (H) 04/25/2011   CHOLHDL 4.5 04/25/2011   Lab Results  Component Value Date   HGBA1C 6.5 (H) 11/24/2021   Lab Results  Component Value Date   VITAMINB12 340 12/29/2017   Lab Results  Component Value Date   TSH 2.390 12/29/2017   ASSESSMENT AND PLAN 82 y.o. year old female   1.  Essential tremor -Stable, primidone very helpful, did not take primidone today, tremor mild to moderate mostly in hands -Check primidone blood level for baseline -Continue primidone 50 mg, 2 tablets twice a day -Unable to tolerate beta-blocker due to side effect  2.  Mild cognitive impairment -Slow worsening memory loss, anxiety contributes, MOCA 14/30 16/30), lives alone with family close by, recommend continued close supervision of living situation.  Have recommended against driving, family committed to close observation. -Continue Aricept, on 5 mg daily, Namenda 10 mg twice daily, does not wish to increase Aricept, some concern for diarrhea -Most consistent with central nervous  system degenerative disorder -Family history of dementia -Laboratory evaluation in November 2019 showed no treatable etiology, normal B12, TS -CT head without contrast in 2013 no significant abnormality  3.  Depression, anxiety -Under better control, on Wellbutrin, Zoloft, trazodone  Margie Ege, Edrick Oh, DNP  [facility] [address], [address] [place], [place] [address] [phone]

## 2022-10-16 LAB — PRIMIDONE, SERUM
Phenobarbital, Serum: 7 ug/mL — ABNORMAL LOW (ref 15–40)
Primidone Lvl: 9.6 ug/mL (ref 5.0–12.0)

## 2022-11-30 ENCOUNTER — Ambulatory Visit (HOSPITAL_COMMUNITY)
Admission: RE | Admit: 2022-11-30 | Discharge: 2022-11-30 | Disposition: A | Discharge status: Home / Self Care | Payer: Medicare Other | Source: Ambulatory Visit | Attending: Acute Care | Admitting: Acute Care

## 2022-11-30 DIAGNOSIS — C8294 Follicular lymphoma, unspecified, lymph nodes of axilla and upper limb: Secondary | ICD-10-CM | POA: Diagnosis not present

## 2022-11-30 DIAGNOSIS — R911 Solitary pulmonary nodule: Secondary | ICD-10-CM | POA: Diagnosis not present

## 2022-11-30 DIAGNOSIS — R590 Localized enlarged lymph nodes: Secondary | ICD-10-CM | POA: Diagnosis not present

## 2022-11-30 DIAGNOSIS — R918 Other nonspecific abnormal finding of lung field: Secondary | ICD-10-CM | POA: Diagnosis not present

## 2022-12-14 ENCOUNTER — Inpatient Hospital Stay: Payer: Medicare Other | Attending: Internal Medicine

## 2022-12-14 ENCOUNTER — Inpatient Hospital Stay: Payer: Medicare Other | Admitting: Hematology

## 2022-12-14 NOTE — Progress Notes (Shared)
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  HISTORY OF PRESENTING ILLNESS:   Rebecca Flores is a wonderful 82 y.o. female who has been referred to [place] by Emilio Aspen, MD for evaluation and management of follicular lymphoma.  Patient was last seen by me on 06/18/2022 and she complained of appetite loss, swelling and redness in her left upper eyelid.    -Discussed lab results from today, 12/14/2022, in detail with the patient.  MEDICAL HISTORY:  Past Medical History:  Diagnosis Date   Bradycardia    Colon polyp    Diabetes mellitus    Family history of anesthesia complication    sister hard to wake up   GERD (gastroesophageal reflux disease)    Hypercholesteremia    Hypertension    Kidney stone    Movement disorder    Osteoporosis    Peripheral neuropathy    Seasonal allergies    Sepsis (HCC)    Tobacco use    Tremors of nervous system     SURGICAL HISTORY: Past Surgical History:  Procedure Laterality Date   ABDOMINAL HYSTERECTOMY     APPENDECTOMY     bladder tacking     BRONCHIAL BIOPSY  05/12/2022   Procedure: BRONCHIAL BIOPSIES;  Surgeon: Josephine Igo, DO;  Location: [facility];  Service: Pulmonary;;   BRONCHIAL BRUSHINGS  05/12/2022   Procedure: BRONCHIAL BRUSHINGS;  Surgeon: Josephine Igo, DO;  Location: [facility];  Service: Pulmonary;;   BRONCHIAL NEEDLE ASPIRATION BIOPSY  05/12/2022   Procedure: BRONCHIAL NEEDLE ASPIRATION BIOPSIES;  Surgeon: Josephine Igo, DO;  Location: [facility];  Service: Pulmonary;;   CHOLECYSTECTOMY  1998   COLONOSCOPY     ENDOBRONCHIAL ULTRASOUND Bilateral 05/12/2022   Procedure: ENDOBRONCHIAL ULTRASOUND;  Surgeon: Josephine Igo, DO;  Location: [facility];  Service:  Pulmonary;  Laterality: Bilateral;   FINE NEEDLE ASPIRATION  05/12/2022   Procedure: FINE NEEDLE ASPIRATION (FNA) LINEAR;  Surgeon: Josephine Igo, DO;  Location: [facility];  Service: Pulmonary;;   HERNIA REPAIR  05/2013   INGUINAL HERNIA REPAIR Right 06/27/2013   Procedure: RIGHT INGUINAL HERNIA REPAIR WITH MESH;  Surgeon: Velora Heckler, MD;  Location: [facility];  Service: General;  Laterality: Right;   INSERTION OF MESH Right 06/27/2013   Procedure: INSERTION OF MESH;  Surgeon: Velora Heckler, MD;  Location: [facility];  Service: General;  Laterality: Right;   MOUTH SURGERY     TONSILLECTOMY      SOCIAL HISTORY: Social History   Socioeconomic History   Marital status: Widowed    Spouse name: Not on file   Number of children: 2   Years of education: 9TH   Highest education level: Not on file  Occupational History    Employer: RETIRED  Tobacco Use   Smoking status: Former    Current packs/day: 0.00    Types: Cigarettes    Quit date: 04/23/2009    Years since quitting: 13.6   Smokeless tobacco: Never  Vaping Use   Vaping status: Never Used  Substance and Sexual Activity   Alcohol use: No    Alcohol/week: 1.0 standard drink of alcohol    Types: 1 Glasses of wine per week   Drug use: No   Sexual activity: Not Currently  Birth control/protection: Surgical  Other Topics Concern   Not on file  Social History Narrative   Patient is widowed with 2 children   09/07/16 Lives alone   Patient has a 9th grade education   Patient is right handed   Patient does not drink caffeine   Social Determinants of Health   Financial Resource Strain: Not on file  Food Insecurity: No Food Insecurity (06/16/2022)   Hunger Vital Sign    Worried About Running Out of Food in the Last Year: Never true    Ran Out of Food in the Last Year: Never true  Transportation Needs: No Transportation Needs (06/16/2022)   PRAPARE - Administrator, Civil Service  (Medical): No    Lack of Transportation (Non-Medical): No  Physical Activity: Not on file  Stress: Not on file  Social Connections: Not on file  Intimate Partner Violence: Not At Risk (06/16/2022)   Humiliation, Afraid, Rape, and Kick questionnaire    Fear of Current or Ex-Partner: No    Emotionally Abused: No    Physically Abused: No    Sexually Abused: No    FAMILY HISTORY: Family History  Problem Relation Age of Onset   Dementia Mother    Diabetes Mellitus II Father    Prostate cancer Father    Dementia Sister    Breast cancer Sister    Breast cancer Daughter     ALLERGIES:  is allergic to nsaids, other, atenolol, codeine, lyrica [pregabalin], simvastatin, and sulfa antibiotics.  MEDICATIONS:  Current Outpatient Medications  Medication Sig Dispense Refill   alendronate (FOSAMAX) 70 MG tablet Take 70 mg by mouth once a week. Take with a full glass of water on an empty stomach.     amLODipine-valsartan (EXFORGE) 10-160 MG tablet Take 1 tablet by mouth daily.     azelastine (OPTIVAR) 0.05 % ophthalmic solution Place 1 drop into both eyes 2 (two) times daily as needed (for allergies).     buPROPion (WELLBUTRIN XL) 150 MG 24 hr tablet Take 150 mg by mouth in the morning.     donepezil (ARICEPT) 5 MG tablet Take 1 tablet (5 mg total) by mouth at bedtime. 90 tablet 3   famotidine (PEPCID) 20 MG tablet Take 20 mg by mouth daily as needed for heartburn.     fenofibrate (TRICOR) 145 MG tablet Take 145 mg by mouth daily.     loratadine (CLARITIN) 10 MG tablet Take 10 mg by mouth daily as needed for allergies (congestion).      memantine (NAMENDA) 10 MG tablet Take 1 tablet (10 mg total) by mouth 2 (two) times daily. 180 tablet 3   primidone (MYSOLINE) 50 MG tablet Take 2 tablets (100 mg total) by mouth in the morning and at bedtime. Indication: essential tremors 360 tablet 4   sertraline (ZOLOFT) 25 MG tablet Take 50 mg by mouth in the morning.     traZODone (DESYREL) 50 MG tablet Take  50 mg by mouth at bedtime.     TRESIBA FLEXTOUCH 100 UNIT/ML SOPN FlexTouch Pen Inject 33 Units into the skin daily.  6   No current facility-administered medications for this visit.    REVIEW OF SYSTEMS:    10 Point review of Systems was done is negative except as noted above.  PHYSICAL EXAMINATION: ECOG PERFORMANCE STATUS: 2 - Symptomatic, <50% confined to bed  .There were no vitals filed for this visit. There were no vitals filed for this visit. .There is no height or weight  on file to calculate BMI.  GENERAL:alert, in no acute distress and comfortable SKIN: no acute rashes, no significant lesions EYES: conjunctiva are pink and non-injected, sclera anicteric OROPHARYNX: MMM, no exudates, no oropharyngeal erythema or ulceration NECK: supple, no JVD LYMPH:  no palpable lymphadenopathy in the cervical, axillary or inguinal regions LUNGS: clear to auscultation b/l with normal respiratory effort HEART: regular rate & rhythm ABDOMEN:  normoactive bowel sounds , non tender, not distended. Extremity: no pedal edema PSYCH: alert & oriented x 3 with fluent speech NEURO: no focal motor/sensory deficits  LABORATORY DATA:  I have reviewed the data as listed  .    Latest Ref Rng & Units 06/01/2022   11:30 AM 05/12/2022    9:15 AM 11/25/2021    3:59 AM  CBC  WBC 4.0 - 10.5 K/uL 6.7  7.8  7.1   Hemoglobin 12.0 - 15.0 g/dL 52.8  41.3  24.4   Hematocrit 36.0 - 46.0 % 35.9  38.1  30.1   Platelets 150 - 400 K/uL 240  246  214     .    Latest Ref Rng & Units 06/01/2022   11:30 AM 05/12/2022    9:15 AM 11/26/2021    2:59 AM  CMP  Glucose 70 - 99 mg/dL 010  92  74   BUN 8 - 23 mg/dL 19  15  26    Creatinine 0.44 - 1.00 mg/dL 2.72  5.36  6.44   Sodium 135 - 145 mmol/L 140  137  135   Potassium 3.5 - 5.1 mmol/L 4.1  3.9  4.6   Chloride 98 - 111 mmol/L 109  107  107   CO2 22 - 32 mmol/L 23  21  20    Calcium 8.9 - 10.3 mg/dL 9.4  9.2  8.8      RADIOGRAPHIC STUDIES: I have personally  reviewed the radiological images as listed and agreed with the findings in the report. CT CHEST WO CONTRAST  Result Date: 12/10/2022 CLINICAL DATA:  Follow-up pulmonary nodules. Biopsy-proven follicular lymphoma on left axillary nodal biopsy 06/01/2022. * Tracking Code: BO * EXAM: CT CHEST WITHOUT CONTRAST TECHNIQUE: Multidetector CT imaging of the chest was performed following the standard protocol without IV contrast. RADIATION DOSE REDUCTION: This exam was performed according to the departmental dose-optimization program which includes automated exposure control, adjustment of the mA and/or kV according to patient size and/or use of iterative reconstruction technique. COMPARISON:  05/08/2022 PET-CT.  03/31/2022 chest CT. FINDINGS: Cardiovascular: Normal heart size. No significant pericardial effusion/thickening. Three-vessel coronary atherosclerosis. Atherosclerotic nonaneurysmal thoracic aorta. Normal caliber pulmonary arteries. Mediastinum/Nodes: No significant thyroid nodules. Unremarkable esophagus. No new or residual pathologically enlarged axillary lymph nodes. Mildly enlarged 1.0 cm AP window node (series 2/image 51), stable from 03/31/2022 chest CT. No additional pathologically enlarged mediastinal nodes. No discrete hilar adenopathy on these noncontrast images. Lungs/Pleura: No pneumothorax. No pleural effusion. Dominant solid 1.9 x 1.7 cm posterior right upper lobe nodule (series 6/image 50), previously 1.8 x 1.7 cm, stable to minimally increased. Subsolid 1.2 x 0.7 cm medial left upper lobe nodule (series 6/image 51), previously 1.3 x 0.6 cm using similar measurement technique, not substantially changed. Sub solid 1.0 cm posterior right lower lobe nodule (series 6/image 99), previously 1.0 cm, stable. Solid lobulated 1.1 x 0.9 cm posterior left lower lobe nodule (series 6/image 115), previously 1.1 x 0.9 cm, stable. Numerous additional smaller ground-glass and sub solid bilateral pulmonary nodules,  not appreciably changed. Upper abdomen: Small hiatal hernia. Cholecystectomy.  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 Musculoskeletal: No aggressive appearing focal osseous lesions. Mild thoracic spondylosis. IMPRESSION: 1. Dominant solid 1.9 cm posterior right upper lobe nodule, stable to minimally increased in size since 03/31/2022 chest CT. Numerous additional smaller bilateral ground-glass and subsolid pulmonary nodules, not substantially changed since 03/31/2022 chest CT. Indolent pulmonary neoplasm such as multifocal pulmonary adenocarcinoma cannot be excluded. Management options include continued chest CT surveillance versus tissue sampling of the dominant right upper lobe solid nodule, as clinically warranted. 2. Resolved left axillary adenopathy. Mild AP window lymphadenopathy, stable from 03/31/2022 chest CT. 3. Three-vessel coronary atherosclerosis. 4. Small hiatal hernia. 5.  Aortic Atherosclerosis (ICD10-I70.0). Electronically Signed   By: Delbert Phenix M.D.   On: 12/10/2022 22:08     Needle core biopsy 06/01/2022:     ASSESSMENT & PLAN:   Wonderful 82 y.o. female with:  Follicular lymphoma grade I, lymph node of left axilla   PLAN:  -Discussed lab results on 06/01/2022 in detail with patient. CBC showed WBC of 6.7K, hemoglobin of 11.7, and platelets of 240K. -discussed PET scan 05/08/2022 which revealed left axillary lymph nodes as well as lymph nodes in central chest which is consistent with stage 2A follicular lymphoma.  -discussed details of grade 1 follicular lymphoma and details of staging of disease -discussed details of categorization and spectrum of non-hodgkin's lymphomas -patient has no constitutional symptoms at this time -do not suggest bone marrow biopsy at this time -would suggest continuing to monitor disease without initiating treatment at this  time -discussed reasons to initiate treatment such as large bulky lymph nodes affecting organs, constitutional symptoms, abnormal blood counts, or bulky disease -answered all of patient's and her daughter-in-law's questions in detail -will repeat scans and blood tests in 6 months -recommend patient to consume a well-balanced diet -recommend patient to drink at least 2L of water daily -Recommend OTC anti-inflammatory eye drop or hydrocortisone to reduce inflammation in her eye.  FOLLOW-UP: ***  The total time spent in the appointment was *** minutes* .  All of the patient's questions were answered with apparent satisfaction. The patient knows to call the clinic with any problems, questions or concerns.   Wyvonnia Lora MD MS AAHIVMS [facility] Hematology/Oncology Physician [facility]  .*Total Encounter Time as defined by the Centers for Medicare and Medicaid Services includes, in addition to the face-to-face time of a patient visit (documented in the note above) non-face-to-face time: obtaining and reviewing outside history, ordering and reviewing medications, tests or procedures, care coordination (communications with other health care professionals or caregivers) and documentation in the medical record.   I,Param Shah,acting as a Neurosurgeon for Wyvonnia Lora, MD.,have documented all relevant documentation on the behalf of Wyvonnia Lora, MD,as directed by  Wyvonnia Lora, MD while in the presence of Wyvonnia Lora, MD.

## 2022-12-18 ENCOUNTER — Ambulatory Visit: Payer: Medicare Other | Admitting: Acute Care

## 2022-12-18 ENCOUNTER — Encounter: Payer: Self-pay | Admitting: Acute Care

## 2022-12-18 VITALS — BP 130/70 | HR 59 | Ht 65.5 in | Wt 142.0 lb

## 2022-12-18 DIAGNOSIS — R911 Solitary pulmonary nodule: Secondary | ICD-10-CM | POA: Diagnosis not present

## 2022-12-18 DIAGNOSIS — Z87891 Personal history of nicotine dependence: Secondary | ICD-10-CM

## 2022-12-18 NOTE — Progress Notes (Signed)
History of Present Illness Rebecca Flores is a 82 y.o. female  former smoker  with with PMH DM, GERD, HLD, HTN, renal stones, peripheral neuropathy . There was an incidental finding of RUL lung nodule.Referred to see Dr. Tonia Brooms in Feb 2024 for abnormal CT chest by Emilio Aspen,.     Synopsis 82 year old female, past medical history of diabetes, gastroesophageal reflux, hypercholesterolemia, hypertension, tobacco use. Patient is a former smoker quit in March 2011.Patient was referred after having CT imaging of the chest completed on 03/31/2022. Patient had some areas of groundglass within the chest and some nodularity. Most notable was a 1.8 x 1.3 cm nodule in the right upper lobe which looks very similar to his previous study in October 2023. There was also a pleural-based nodule in the left upper lobe. And a right lower lobe 1 cm nodule that looked like it had partially resolved favoring inflammation. Patient from respiratory stance has no complaints .She is very anxious about the findings on CT. Her CT scan does show significant proved from October but in October she was diagnosed with COVID-19. She is a former smoker quit several years ago.  The size of lesion in the right upper lobe is concerning for lobulated margins, and there was concern this was a potential underlying malignancy.Due to the size of the lesion and probability with [facility] calculator puts the lesion at approximately 60% risk of being malignant. She was offered PET scan to determine need for bronch, but she opted for bronch due to lesion size. Pt underwent  Flexible video fiberoptic bronchoscopy with robotic assistance and biopsies on 05/12/2022. Lung biopsies were negative for lung cancer , but we needed to evaluate the left axillary lymph node that was hypermetabolic on PET scan to complete her work up. She underwent US guided left axillary LN bx  for further evaluation on 06/01/2022. She is here today to discuss the  results of the left axillary lymph node biopsy       12/18/2022 Pt. Presents for follow up to review Ct Chest as surveillance of a right upper lobe pulmonary nodule which was negative for malignancy per biopsy. She states she has been doing well. She has been stable from a respirator standpoint. We have reviewed her follow up CT Chest. The nodule of concern, which Dr. Tonia Brooms feels is a hamartoma, has grown 1 mm in 6 months. We will do a follow up Ct Chest in 33month to continue to follow this nodule. She is in agreement with this plan.   Test Results: Side by Side images of lung nodule of concern . L= April 2024, R= 11/2022.   Surgical Pathology 06/01/2022 FINAL MICROSCOPIC DIAGNOSIS:   A. LYMPH NODE, LEFT AXILLARY, NEEDLE CORE BIOPSY:  -  Consistent with follicular lymphoma, favor low-grade, see note.    The biopsy consists of heavily fragmented thin needle cores consisting with what appears to be a predominantly small lymphocytes the morphology is slightly hindered due to suboptimal fixation and the fragmented nature of the biopsy.  The cells are B cells (CD20 positive) with a germinal center immunophenotype (CD10 and BCL6 positive) with aberrant Bcl-2 staining consistent with a follicular lymphoma. CD3/CD5/CD43 highlight background small T cells.  Cyclin D1 is appropriately negative.  Flow cytometry likewise identifies lambda restricted CD10 positive B cells at 34% supporting the above interpretation.  While CD23 is predominantly lost morphologically there still appears to be a predominantly nodular/follicular architecture. Centroblasts are few (less than 5/hpf) on this limited  biopsy.  In addition the proliferation rate by Ki-67 is overall low approximately 5 to 10% favoring a low-grade (grade 1 on current material) follicular lymphoma; however, sampling cannot be excluded especially in light of the noted FDG positivity present in the clinical history. Clinical/radiologic  correlation necessary.    Favors Low Grade Non-Hodgkins Lymphoma   Cytology 05/12/2022, bronchoscopy FINAL MICROSCOPIC DIAGNOSIS:   A.  RIGHT LUNG, UPPER LOBE, FINE NEEDLE ASPIRATION:  - Negative for malignancy  - Benign bronchial cells within a mucoid and sanguinous background  - Fibrous and fibroelastotic stroma (cellblock)   B.  RIGHT LUNG, UPPER LOBE, BRUSHING:  - Negative for malignancy  - Numerous benign bronchial cells   Dr. Tonia Brooms feels this is a hamartoma or some benign fibrous tumor.      Latest Ref Rng & Units 06/01/2022   11:30 AM 05/12/2022    9:15 AM 11/25/2021    3:59 AM  CBC  WBC 4.0 - 10.5 K/uL 6.7  7.8  7.1   Hemoglobin 12.0 - 15.0 g/dL 16.1  09.6  04.5   Hematocrit 36.0 - 46.0 % 35.9  38.1  30.1   Platelets 150 - 400 K/uL 240  246  214        Latest Ref Rng & Units 06/01/2022   11:30 AM 05/12/2022    9:15 AM 11/26/2021    2:59 AM  BMP  Glucose 70 - 99 mg/dL 409  92  74   BUN 8 - 23 mg/dL 19  15  26    Creatinine 0.44 - 1.00 mg/dL 8.11  9.14  7.82   Sodium 135 - 145 mmol/L 140  137  135   Potassium 3.5 - 5.1 mmol/L 4.1  3.9  4.6   Chloride 98 - 111 mmol/L 109  107  107   CO2 22 - 32 mmol/L 23  21  20    Calcium 8.9 - 10.3 mg/dL 9.4  9.2  8.8     BNP No results found for: "BNP"  ProBNP No results found for: "PROBNP"  PFT No results found for: "FEV1PRE", "FEV1POST", "FVCPRE", "FVCPOST", "TLC", "DLCOUNC", "PREFEV1FVCRT", "PSTFEV1FVCRT"  CT CHEST WO CONTRAST  Result Date: 12/10/2022 CLINICAL DATA:  Follow-up pulmonary nodules. Biopsy-proven follicular lymphoma on left axillary nodal biopsy 06/01/2022. * Tracking Code: BO * EXAM: CT CHEST WITHOUT CONTRAST TECHNIQUE: Multidetector CT imaging of the chest was performed following the standard protocol without IV contrast. RADIATION DOSE REDUCTION: This exam was performed according to the departmental dose-optimization program which includes automated exposure control, adjustment of the mA and/or kV according  to patient size and/or use of iterative reconstruction technique. COMPARISON:  05/08/2022 PET-CT.  03/31/2022 chest CT. FINDINGS: Cardiovascular: Normal heart size. No significant pericardial effusion/thickening. Three-vessel coronary atherosclerosis. Atherosclerotic nonaneurysmal thoracic aorta. Normal caliber pulmonary arteries. Mediastinum/Nodes: No significant thyroid nodules. Unremarkable esophagus. No new or residual pathologically enlarged axillary lymph nodes. Mildly enlarged 1.0 cm AP window node (series 2/image 51), stable from 03/31/2022 chest CT. No additional pathologically enlarged mediastinal nodes. No discrete hilar adenopathy on these noncontrast images. Lungs/Pleura: No pneumothorax. No pleural effusion. Dominant solid 1.9 x 1.7 cm posterior right upper lobe nodule (series 6/image 50), previously 1.8 x 1.7 cm, stable to minimally increased. Subsolid 1.2 x 0.7 cm medial left upper lobe nodule (series 6/image 51), previously 1.3 x 0.6 cm using similar measurement technique, not substantially changed. Sub solid 1.0 cm posterior right lower lobe nodule (series 6/image 99), previously 1.0 cm, stable. Solid lobulated 1.1 x 0.9 cm posterior  left lower lobe nodule (series 6/image 115), previously 1.1 x 0.9 cm, stable. Numerous additional smaller ground-glass and sub solid bilateral pulmonary nodules, not appreciably changed. Upper abdomen: Small hiatal hernia. Cholecystectomy. 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 Musculoskeletal: No aggressive appearing focal osseous lesions. Mild thoracic spondylosis. IMPRESSION: 1. Dominant solid 1.9 cm posterior right upper lobe nodule, stable to minimally increased in size since 03/31/2022 chest CT. Numerous additional smaller bilateral ground-glass and subsolid pulmonary nodules, not substantially changed since 03/31/2022 chest CT. Indolent  pulmonary neoplasm such as multifocal pulmonary adenocarcinoma cannot be excluded. Management options include continued chest CT surveillance versus tissue sampling of the dominant right upper lobe solid nodule, as clinically warranted. 2. Resolved left axillary adenopathy. Mild AP window lymphadenopathy, stable from 03/31/2022 chest CT. 3. Three-vessel coronary atherosclerosis. 4. Small hiatal hernia. 5.  Aortic Atherosclerosis (ICD10-I70.0). Electronically Signed   By: Delbert Phenix M.D.   On: 12/10/2022 22:08     Past medical hx Past Medical History:  Diagnosis Date   Bradycardia    Colon polyp    Diabetes mellitus    Family history of anesthesia complication    sister hard to wake up   GERD (gastroesophageal reflux disease)    Hypercholesteremia    Hypertension    Kidney stone    Movement disorder    Osteoporosis    Peripheral neuropathy    Seasonal allergies    Sepsis (HCC)    Tobacco use    Tremors of nervous system      Social History   Tobacco Use   Smoking status: Former    Current packs/day: 0.00    Types: Cigarettes    Quit date: 04/23/2009    Years since quitting: 13.6   Smokeless tobacco: Never  Vaping Use   Vaping status: Never Used  Substance Use Topics   Alcohol use: No    Alcohol/week: 1.0 standard drink of alcohol    Types: 1 Glasses of wine per week   Drug use: No    Ms.Mallinson reports that she quit smoking about 13 years ago. Her smoking use included cigarettes. She has never used smokeless tobacco. She reports that she does not drink alcohol and does not use drugs.  Tobacco Cessation: Former smoker , Quit 04/23/2009   Past surgical hx, Family hx, Social hx all reviewed.  Current Outpatient Medications on File Prior to Visit  Medication Sig   alendronate (FOSAMAX) 70 MG tablet Take 70 mg by mouth once a week. Take with a full glass of water on an empty stomach.   amLODipine-valsartan (EXFORGE) 10-160 MG tablet Take 1 tablet by mouth daily.    azelastine (OPTIVAR) 0.05 % ophthalmic solution Place 1 drop into both eyes 2 (two) times daily as needed (for allergies).   buPROPion (WELLBUTRIN XL) 150 MG 24 hr tablet Take 150 mg by mouth in the morning.   donepezil (ARICEPT) 5 MG tablet Take 1 tablet (5 mg total) by mouth at bedtime.   famotidine (PEPCID) 20 MG tablet Take 20 mg by mouth daily as needed for heartburn.   fenofibrate (TRICOR) 145 MG tablet Take 145 mg by mouth daily.   loratadine (CLARITIN) 10 MG tablet Take 10 mg by mouth daily as needed for allergies (congestion).    memantine (NAMENDA) 10 MG tablet Take 1 tablet (10 mg total) by mouth 2 (two) times daily.  primidone (MYSOLINE) 50 MG tablet Take 2 tablets (100 mg total) by mouth in the morning and at bedtime. Indication: essential tremors   sertraline (ZOLOFT) 25 MG tablet Take 50 mg by mouth in the morning.   traZODone (DESYREL) 50 MG tablet Take 50 mg by mouth at bedtime.   TRESIBA FLEXTOUCH 100 UNIT/ML SOPN FlexTouch Pen Inject 33 Units into the skin daily.   No current facility-administered medications on file prior to visit.     Allergies  Allergen Reactions   Nsaids     Severe facial swelling/rash Pt takes "advil and ibuprophen without problems"   Other Rash    Seldane   Atenolol     Hair loss    Codeine Nausea Only    Nausea    Lyrica [Pregabalin]     Constipation, swelling   Simvastatin     Leg cramps   Sulfa Antibiotics     Pruritis      Review Of Systems:  Constitutional:   No  weight loss, night sweats,  Fevers, chills, fatigue, or  lassitude.  HEENT:   No headaches,  Difficulty swallowing,  Tooth/dental problems, or  Sore throat,                No sneezing, itching, ear ache, nasal congestion, post nasal drip,   CV:  No chest pain,  Orthopnea, PND, swelling in lower extremities, anasarca, dizziness, palpitations, syncope.   GI  No heartburn, indigestion, abdominal pain, nausea, vomiting, diarrhea, change in bowel habits, loss of  appetite, bloody stools.   Resp: No shortness of breath with exertion or at rest.  No excess mucus, no productive cough,  No non-productive cough,  No coughing up of blood.  No change in color of mucus.  No wheezing.  No chest wall deformity  Skin: no rash or lesions.  GU: no dysuria, change in color of urine, no urgency or frequency.  No flank pain, no hematuria   MS:  No joint pain or swelling.  No decreased range of motion.  No back pain.  Psych:  No change in mood or affect. No depression or anxiety.  No memory loss.   Vital Signs BP 130/70 (BP Location: Right Arm, Cuff Size: Normal)   Pulse (!) 59   Ht 5' 5.5" (1.664 m)   Wt 142 lb (64.4 kg)   SpO2 94%   BMI 23.27 kg/m    Physical Exam:  General- No distress,  A&Ox3, pleasant ENT: No sinus tenderness, TM clear, pale nasal mucosa, no oral exudate,no post nasal drip, no LAN Cardiac: S1, S2, regular rate and rhythm, no murmur Chest: No wheeze/ rales/ dullness; no accessory muscle use, no nasal flaring, no sternal retractions, diminished per bases Abd.: Soft Non-tender, ND, BS  +, Body mass index is 23.27 kg/m.  Ext: No clubbing cyanosis, trace Bilateral ankle edema>> wears compression hose Neuro:  normal strength, MAE x 4, A&O x e Skin: No rashes, warm and dry, no lesion Psych: normal mood and behavior   Assessment/Plan Stable Right Upper Lobe Pulmonary Nodule Former smoker  Plan The lung nodule we have been following show very slight growth. It is 1 mm larger than it was in April. We will do a 6 month follow up CT Chest due ( April 2025)  We will see you in the office after the scan to review the results. Please schedule follow up with Dr. Ronaldo Miyamoto for Lab work. In his note in April stated he wanted to see you in  6 months. Follow up as needed. Please contact office for sooner follow up if symptoms do not improve or worsen or seek emergency care     I spent 20 minutes dedicated to the care of this patient on the date  of this encounter to include pre-visit review of records, face-to-face time with the patient discussing conditions above, post visit ordering of testing, clinical documentation with the electronic health record, making appropriate referrals as documented, and communicating necessary information to the patient's healthcare team.      Bevelyn Ngo, NP 12/18/2022  1:38 PM

## 2022-12-18 NOTE — Patient Instructions (Addendum)
It is good to see you today.  The lung nodule we have been following show very slight growth. It is 1 mm larger than it was in April. We will do a 6 month follow up CT Chest due ( April 2025)  We will see you in the office after the scan to review the results. Please schedule follow up with Dr. Ronaldo Miyamoto for Lab work. In his note in April stated he wanted to see you in 6 months. Follow up as needed. Please contact office for sooner follow up if symptoms do not improve or worsen or seek emergency care

## 2023-01-26 DIAGNOSIS — E782 Mixed hyperlipidemia: Secondary | ICD-10-CM | POA: Diagnosis not present

## 2023-01-26 DIAGNOSIS — Z79899 Other long term (current) drug therapy: Secondary | ICD-10-CM | POA: Diagnosis not present

## 2023-01-26 DIAGNOSIS — Z0000 Encounter for general adult medical examination without abnormal findings: Secondary | ICD-10-CM | POA: Diagnosis not present

## 2023-01-26 DIAGNOSIS — E1165 Type 2 diabetes mellitus with hyperglycemia: Secondary | ICD-10-CM | POA: Diagnosis not present

## 2023-01-26 DIAGNOSIS — I10 Essential (primary) hypertension: Secondary | ICD-10-CM | POA: Diagnosis not present

## 2023-01-26 DIAGNOSIS — J449 Chronic obstructive pulmonary disease, unspecified: Secondary | ICD-10-CM | POA: Diagnosis not present

## 2023-01-26 DIAGNOSIS — M810 Age-related osteoporosis without current pathological fracture: Secondary | ICD-10-CM | POA: Diagnosis not present

## 2023-01-26 DIAGNOSIS — N1831 Chronic kidney disease, stage 3a: Secondary | ICD-10-CM | POA: Diagnosis not present

## 2023-01-26 DIAGNOSIS — E1142 Type 2 diabetes mellitus with diabetic polyneuropathy: Secondary | ICD-10-CM | POA: Diagnosis not present

## 2023-01-26 DIAGNOSIS — Z23 Encounter for immunization: Secondary | ICD-10-CM | POA: Diagnosis not present

## 2023-01-26 DIAGNOSIS — E1122 Type 2 diabetes mellitus with diabetic chronic kidney disease: Secondary | ICD-10-CM | POA: Diagnosis not present

## 2023-01-26 DIAGNOSIS — C8208 Follicular lymphoma grade I, lymph nodes of multiple sites: Secondary | ICD-10-CM | POA: Diagnosis not present

## 2023-01-27 ENCOUNTER — Other Ambulatory Visit: Payer: Self-pay | Admitting: Internal Medicine

## 2023-01-27 ENCOUNTER — Encounter: Payer: Self-pay | Admitting: Internal Medicine

## 2023-01-27 DIAGNOSIS — M810 Age-related osteoporosis without current pathological fracture: Secondary | ICD-10-CM

## 2023-01-29 DIAGNOSIS — H35363 Drusen (degenerative) of macula, bilateral: Secondary | ICD-10-CM | POA: Diagnosis not present

## 2023-03-21 ENCOUNTER — Other Ambulatory Visit: Payer: Self-pay | Admitting: Neurology

## 2023-04-26 DIAGNOSIS — E1165 Type 2 diabetes mellitus with hyperglycemia: Secondary | ICD-10-CM | POA: Diagnosis not present

## 2023-06-02 ENCOUNTER — Ambulatory Visit
Admission: RE | Admit: 2023-06-02 | Discharge: 2023-06-02 | Disposition: A | Discharge status: Home / Self Care | Payer: Medicare Other | Source: Ambulatory Visit | Attending: Acute Care | Admitting: Acute Care

## 2023-06-02 DIAGNOSIS — R911 Solitary pulmonary nodule: Secondary | ICD-10-CM

## 2023-06-02 DIAGNOSIS — I2510 Atherosclerotic heart disease of native coronary artery without angina pectoris: Secondary | ICD-10-CM | POA: Diagnosis not present

## 2023-06-02 DIAGNOSIS — K449 Diaphragmatic hernia without obstruction or gangrene: Secondary | ICD-10-CM | POA: Diagnosis not present

## 2023-06-02 DIAGNOSIS — R599 Enlarged lymph nodes, unspecified: Secondary | ICD-10-CM | POA: Diagnosis not present

## 2023-06-02 DIAGNOSIS — R918 Other nonspecific abnormal finding of lung field: Secondary | ICD-10-CM | POA: Diagnosis not present

## 2023-07-28 DIAGNOSIS — F5109 Other insomnia not due to a substance or known physiological condition: Secondary | ICD-10-CM | POA: Diagnosis not present

## 2023-07-28 DIAGNOSIS — E1165 Type 2 diabetes mellitus with hyperglycemia: Secondary | ICD-10-CM | POA: Diagnosis not present

## 2023-07-28 DIAGNOSIS — D649 Anemia, unspecified: Secondary | ICD-10-CM | POA: Diagnosis not present

## 2023-07-28 DIAGNOSIS — N1831 Chronic kidney disease, stage 3a: Secondary | ICD-10-CM | POA: Diagnosis not present

## 2023-07-28 DIAGNOSIS — M810 Age-related osteoporosis without current pathological fracture: Secondary | ICD-10-CM | POA: Diagnosis not present

## 2023-07-28 DIAGNOSIS — E559 Vitamin D deficiency, unspecified: Secondary | ICD-10-CM | POA: Diagnosis not present

## 2023-07-28 DIAGNOSIS — I10 Essential (primary) hypertension: Secondary | ICD-10-CM | POA: Diagnosis not present

## 2023-07-28 DIAGNOSIS — E1122 Type 2 diabetes mellitus with diabetic chronic kidney disease: Secondary | ICD-10-CM | POA: Diagnosis not present

## 2023-07-28 DIAGNOSIS — J449 Chronic obstructive pulmonary disease, unspecified: Secondary | ICD-10-CM | POA: Diagnosis not present

## 2023-07-28 DIAGNOSIS — C8208 Follicular lymphoma grade I, lymph nodes of multiple sites: Secondary | ICD-10-CM | POA: Diagnosis not present

## 2023-07-29 DIAGNOSIS — E1165 Type 2 diabetes mellitus with hyperglycemia: Secondary | ICD-10-CM | POA: Diagnosis not present

## 2023-07-30 DIAGNOSIS — H35363 Drusen (degenerative) of macula, bilateral: Secondary | ICD-10-CM | POA: Diagnosis not present

## 2023-07-30 DIAGNOSIS — H04123 Dry eye syndrome of bilateral lacrimal glands: Secondary | ICD-10-CM | POA: Diagnosis not present

## 2023-07-30 DIAGNOSIS — H524 Presbyopia: Secondary | ICD-10-CM | POA: Diagnosis not present

## 2023-07-30 DIAGNOSIS — H35033 Hypertensive retinopathy, bilateral: Secondary | ICD-10-CM | POA: Diagnosis not present

## 2023-07-30 DIAGNOSIS — E119 Type 2 diabetes mellitus without complications: Secondary | ICD-10-CM | POA: Diagnosis not present

## 2023-10-19 ENCOUNTER — Ambulatory Visit: Payer: Medicare Other | Admitting: Neurology

## 2023-10-19 ENCOUNTER — Encounter: Payer: Self-pay | Admitting: Neurology

## 2023-10-19 VITALS — BP 120/80 | HR 60 | Ht 65.0 in | Wt 146.5 lb

## 2023-10-19 DIAGNOSIS — H35039 Hypertensive retinopathy, unspecified eye: Secondary | ICD-10-CM | POA: Insufficient documentation

## 2023-10-19 DIAGNOSIS — F03B4 Unspecified dementia, moderate, with anxiety: Secondary | ICD-10-CM | POA: Diagnosis not present

## 2023-10-19 DIAGNOSIS — G250 Essential tremor: Secondary | ICD-10-CM | POA: Diagnosis not present

## 2023-10-19 DIAGNOSIS — F329 Major depressive disorder, single episode, unspecified: Secondary | ICD-10-CM | POA: Insufficient documentation

## 2023-10-19 DIAGNOSIS — R911 Solitary pulmonary nodule: Secondary | ICD-10-CM | POA: Insufficient documentation

## 2023-10-19 DIAGNOSIS — J449 Chronic obstructive pulmonary disease, unspecified: Secondary | ICD-10-CM | POA: Insufficient documentation

## 2023-10-19 DIAGNOSIS — N1831 Chronic kidney disease, stage 3a: Secondary | ICD-10-CM | POA: Insufficient documentation

## 2023-10-19 DIAGNOSIS — E782 Mixed hyperlipidemia: Secondary | ICD-10-CM | POA: Insufficient documentation

## 2023-10-19 DIAGNOSIS — M810 Age-related osteoporosis without current pathological fracture: Secondary | ICD-10-CM | POA: Insufficient documentation

## 2023-10-19 DIAGNOSIS — I10 Essential (primary) hypertension: Secondary | ICD-10-CM | POA: Insufficient documentation

## 2023-10-19 DIAGNOSIS — F322 Major depressive disorder, single episode, severe without psychotic features: Secondary | ICD-10-CM | POA: Insufficient documentation

## 2023-10-19 DIAGNOSIS — E1165 Type 2 diabetes mellitus with hyperglycemia: Secondary | ICD-10-CM | POA: Insufficient documentation

## 2023-10-19 DIAGNOSIS — E559 Vitamin D deficiency, unspecified: Secondary | ICD-10-CM | POA: Insufficient documentation

## 2023-10-19 DIAGNOSIS — E1122 Type 2 diabetes mellitus with diabetic chronic kidney disease: Secondary | ICD-10-CM | POA: Insufficient documentation

## 2023-10-19 MED ORDER — PRIMIDONE 50 MG PO TABS: 100.0000 mg | ORAL_TABLET | Freq: Two times a day (BID) | ORAL | 4 refills | Status: AC

## 2023-10-19 NOTE — Progress Notes (Signed)
 HISTORY OF PRESENT ILLNESS  [name], giancola been followed here since June 2009 for tremor felt to represent essential tremor, and previously followed by Dr. Germaine. Tremor also involves the head and to a lesser extent the hands. She has been treated with primidone  with improvement. She is independent in all activities of daily living and she drives a car without problems.Her father suffered Parkinson's disease, mother died of Alzheimer's disease, there was no tremors in her siblings, she is now taking primidone  50 mg three times daily at 830, 1230 and 7:30 pm.She denies significant side effects from the medications.     UPDATE September 05 2014:She was diagnosed with diabetes around 2014, she now complains of bilateral toes, feet paresthesia, burning discomfort, difficulty falling to sleep because of her feet pain,  She has tried gabapentin without helping her symptoms, she denies gait difficulty  UPDATE August 6th 2019: She complains of depression, chronic insomnia, difficulty sleeping for 2 months. She cries over everything, she lives by herself,    In addition, she complains of 2 years history of bowel and bladder incontinence, gradually getting worse, to the point she is afraid to go out, went to take a long walk, often comes back with incontinence, she has mild bilateral feet paresthesia, unsteady gait   She continue to take primidone  50 mg 3 times for her tremor, which has helped her symptoms   UPDATE Nov 6th 2019: She is accompanied by her daughter at today's clinical visit. Essential tremor: primidone  50mg  tid does help her symptoms, Depression: celexa  20mg  every day was helping her symptoms, but she recently began to take it every other day, daughter noticed worsening anxiety.   Peripheral neuropathy: She continue have bilateral feet paresthesia, numbness tingling, urge to move, mostly at night, sometimes she sleeps with her sock on, she was taking nortriptyline  10 mg, 2 tablets every  night which help her sleep better.,  Her mother suffered dementia, she was noted to have gradual worsening memory loss, today's [facility] examination was 20 out of 30   UPDATE October 04 2018: She is accompanied by her daughter-in-law at today's clinical visit, Essential tremor, she is taking primidone  50 mg 3 times a day, worst time is in the morning time, shake like a leaf, she did notice mild improvement after taking medication,   She also complains of worsening memory loss, she has been taking nortriptyline  50 mg at bedtime to help her sleep, and bilateral feet paresthesia  UPDATE September 20 2019: She continues to be bothered by her slow worsening essential tremor, involving upper extremity, and also has frequent head titubation, tried propanolol, could not tolerate it, did used to primidone , went back on it, used to take 50 mg 2 tablets in the morning, 1 tablet at night, when trying to take 3 times a day, she felt the morning dose was not enough, sometimes she becomes so shaky, also with associated anxiety, occasionally hypoglycemia episode  She still lives by herself, still driving, slow worsening memory loss, does not want to take Namenda   Update June 05, 2020 SS: Here today with her daughter and daughter in law, remains on primidone  100 mg twice daily. Lives alone, does everything on her own, drives well.  Claims not sleeping well, has been out of melatonin.  Stopped taking Namenda  last year, did not start Aricept  due to fear of diarrhea.  Continues with anxiety, certain things get her very upset. Has fallen victim to several spam calls, lost money, daughters are changing  her phone #.  Tremor worsens over time involving both upper extremities, head and neck tremor.   Update April 08, 2021 SS: Here today with daughter, [name]. Lives alone, does this well, she drives, she has a boyfriend, he stresses her out, worries about his aging health. With any anxiety or stress, she'll start crying, have  panic attack. People try to call and scam her, worries about this. Is a people pleasing person. Tremor continues, head, jaw, both hands, not sure if taking Primidone  50 mg, 2 tablets twice daily. I was able to see PCP notes, added Zoloft , Trazodone, Wellbutrin . She stopped Celexa , but isn't taking Zoloft . On Aricept  5 mg, Namenda  10 mg BID. MMSE 22/30.  Update October 08, 2021 SS: MOCA 16/30. Here with daughter in law, [name]. Still has boyfriend. Lives alone, drives, kids are close by. Has trouble keeping up with medications, daughter helps. On Aricept  5 mg daily, Namenda  10 mg twice daily. No major changes. Even with calendar will ask about reminders. Is very careful not to answer phone to prevent scans. Tremors are ok, primidone  really helps. No falls. Looks good today.   Update October 14, 2022 SS: MOCA 14/30. Here with DIL [name], daughter [name]. Living alone, is driving mostly with someone else, calls her family if she goes out. Does her own ADLs, family manages medications. Tremor in hands, primidone  helps a lot. On Aricept  5 mg daily, Namenda  10 mg BID. Had COVID a year ago, noticed worsening cognitive functioning, gets confused with her mail, causing anxiety. Family has taken over checkbook. She wants to stay at her home. 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   Update October 19, 2023 SS: Last year primidone  level 9.6. Living alone, short distance driving with some, or going 1-2 miles to known places. Does her own ADLs, keeping up with housework. Has a boyfriend who takes her out to eat. Cooks in the microwave. Family manages medications, Aricept  increased to 10 mg, Namenda  10 mg BID. Family feels memory has worsened short term, misplacing things, didn't remember where she was coming today, confuses dates, banking, mail. Sleep well, eating good. No falls. Some depression, her boyfriend's health hasn't been good, worries about him. Tremor is doing  well, on primidone . They rarely see tremor.   REVIEW OF SYSTEMS: Out of a complete 14 system review of symptoms, the patient complains only of the following symptoms, and all other reviewed systems are negative.  See HPI  ALLERGIES: Allergies  Allergen Reactions   Nsaids     Severe facial swelling/rash Pt takes advil and ibuprophen without problems   Other Rash    Seldane   Atenolol     Hair loss    Codeine Nausea Only    Nausea    Lyrica  [Pregabalin ]     Constipation, swelling   Simvastatin     Leg cramps   Sulfa Antibiotics     Pruritis      HOME MEDICATIONS: Outpatient Medications Prior to Visit  Medication Sig Dispense Refill   amLODipine -valsartan  (EXFORGE ) 10-160 MG tablet Take 1 tablet by mouth daily.     donepezil  (ARICEPT ) 5 MG tablet Take 1 tablet (5 mg total) by mouth at bedtime. (Patient taking differently: Take 10 mg by mouth at bedtime. 2 tablets) 90 tablet 3   fenofibrate (TRICOR) 145 MG tablet Take 145 mg by mouth daily.     loratadine  (CLARITIN ) 10 MG tablet Take 10 mg by mouth daily  as needed for allergies (congestion).      memantine  (NAMENDA ) 10 MG tablet TAKE 1 TABLET BY MOUTH TWICE A DAY 180 tablet 3   primidone  (MYSOLINE ) 50 MG tablet Take 2 tablets (100 mg total) by mouth in the morning and at bedtime. Indication: essential tremors 360 tablet 4   sertraline  (ZOLOFT ) 25 MG tablet Take 50 mg by mouth in the morning.     TRESIBA FLEXTOUCH 100 UNIT/ML SOPN FlexTouch Pen Inject 33 Units into the skin daily.  6   alendronate (FOSAMAX) 70 MG tablet Take 70 mg by mouth once a week. Take with a full glass of water on an empty stomach. (Patient not taking: Reported on 10/19/2023)     azelastine (OPTIVAR) 0.05 % ophthalmic solution Place 1 drop into both eyes 2 (two) times daily as needed (for allergies). (Patient not taking: Reported on 10/19/2023)     buPROPion  (WELLBUTRIN  XL) 150 MG 24 hr tablet Take 150 mg by mouth in the morning. (Patient not taking:  Reported on 10/19/2023)     Cholecalciferol  50 MCG (2000 UT) CAPS TAKE 1 CAPSULE BY MOUTH EVERY DAY FOR 90 DAYS; Duration: 90     famotidine (PEPCID) 20 MG tablet Take 20 mg by mouth daily as needed for heartburn. (Patient not taking: Reported on 10/19/2023)     traZODone (DESYREL) 50 MG tablet Take 50 mg by mouth at bedtime. (Patient not taking: Reported on 10/19/2023)     No facility-administered medications prior to visit.    PAST MEDICAL HISTORY: Past Medical History:  Diagnosis Date   Bradycardia    Colon polyp    Diabetes mellitus    Family history of anesthesia complication    sister hard to wake up   GERD (gastroesophageal reflux disease)    Hypercholesteremia    Hypertension    Kidney stone    Movement disorder    Osteoporosis    Peripheral neuropathy    Seasonal allergies    Sepsis (HCC)    Tobacco use    Tremors of nervous system     PAST SURGICAL HISTORY: Past Surgical History:  Procedure Laterality Date   ABDOMINAL HYSTERECTOMY     APPENDECTOMY     bladder tacking     BRONCHIAL BIOPSY  05/12/2022   Procedure: BRONCHIAL BIOPSIES;  Surgeon: Brenna Adine CROME, DO;  Location: [facility];  Service: Pulmonary;;   BRONCHIAL BRUSHINGS  05/12/2022   Procedure: BRONCHIAL BRUSHINGS;  Surgeon: Brenna Adine CROME, DO;  Location: [facility];  Service: Pulmonary;;   BRONCHIAL NEEDLE ASPIRATION BIOPSY  05/12/2022   Procedure: BRONCHIAL NEEDLE ASPIRATION BIOPSIES;  Surgeon: Brenna Adine CROME, DO;  Location: [facility];  Service: Pulmonary;;   CHOLECYSTECTOMY  1998   COLONOSCOPY     ENDOBRONCHIAL ULTRASOUND Bilateral 05/12/2022   Procedure: ENDOBRONCHIAL ULTRASOUND;  Surgeon: Brenna Adine CROME, DO;  Location: [facility];  Service: Pulmonary;  Laterality: Bilateral;   FINE NEEDLE ASPIRATION  05/12/2022   Procedure: FINE NEEDLE ASPIRATION (FNA) LINEAR;  Surgeon: Brenna Adine CROME, DO;  Location: [facility];  Service: Pulmonary;;   HERNIA REPAIR  05/2013   INGUINAL HERNIA REPAIR  Right 06/27/2013   Procedure: RIGHT INGUINAL HERNIA REPAIR WITH MESH;  Surgeon: Krystal CHRISTELLA Spinner, MD;  Location: [facility];  Service: General;  Laterality: Right;   INSERTION OF MESH Right 06/27/2013   Procedure: INSERTION OF MESH;  Surgeon: Krystal CHRISTELLA Spinner, MD;  Location: [facility];  Service: General;  Laterality: Right;   MOUTH SURGERY  TONSILLECTOMY      FAMILY HISTORY: Family History  Problem Relation Age of Onset   Dementia Mother    Diabetes Mellitus II Father    Prostate cancer Father    Dementia Sister    Breast cancer Sister    Breast cancer Daughter     SOCIAL HISTORY: Social History   Socioeconomic History   Marital status: Widowed    Spouse name: Not on file   Number of children: 2   Years of education: 9TH   Highest education level: Not on file  Occupational History    Employer: RETIRED  Tobacco Use   Smoking status: Former    Current packs/day: 0.00    Types: Cigarettes    Quit date: 04/23/2009    Years since quitting: 14.4   Smokeless tobacco: Never  Vaping Use   Vaping status: Never Used  Substance and Sexual Activity   Alcohol use: No    Alcohol/week: 1.0 standard drink of alcohol    Types: 1 Glasses of wine per week   Drug use: No   Sexual activity: Not Currently    Birth control/protection: Surgical  Other Topics Concern   Not on file  Social History Narrative   Patient is widowed with 2 children   09/07/16 Lives alone   Patient has a 9th grade education   Patient is right handed   Patient does not drink caffeine   Social Drivers of Corporate investment banker Strain: Not on file  Food Insecurity: No Food Insecurity (06/16/2022)   Hunger Vital Sign    Worried About Running Out of Food in the Last Year: Never true    Ran Out of Food in the Last Year: Never true  Transportation Needs: No Transportation Needs (06/16/2022)   PRAPARE - Administrator, Civil Service (Medical): No    Lack of Transportation  (Non-Medical): No  Physical Activity: Not on file  Stress: Not on file  Social Connections: Not on file  Intimate Partner Violence: Not At Risk (06/16/2022)   Humiliation, Afraid, Rape, and Kick questionnaire    Fear of Current or Ex-Partner: No    Emotionally Abused: No    Physically Abused: No    Sexually Abused: No   PHYSICAL EXAM  Vitals:   10/19/23 1052  BP: 120/80  Pulse: 60  SpO2: 98%  Weight: 146 lb 8 oz (66.5 kg)  Height: 5' 5 (1.651 m)    Body mass index is 24.38 kg/m.  Generalized: Well developed, in no acute distress     04/08/2021    2:17 PM 06/05/2020    1:00 PM 09/20/2019    2:28 PM  MMSE - Mini Mental State Exam  Orientation to time 4 3 4   Orientation to Place 5 5 4   Registration 3 3 3   Attention/ Calculation 0 0 0  Recall 1 1 0  Language- name 2 objects 2 2 2   Language- repeat 1 1 1   Language- follow 3 step command 3 3 3   Language- read & follow direction 1 1 1   Write a sentence 1 1 1   Copy design 1 1 1   Total score 22 21 20       10/14/2022   10:38 AM 10/08/2021    9:20 AM 12/31/2017    9:00 AM  Montreal Cognitive Assessment   Visuospatial/ Executive (0/5) 3 3 4   Naming (0/3) 2 1 2   Attention: Read list of digits (0/2) 2 1 2   Attention: Read list of  letters (0/1) 1 1 1   Attention: Serial 7 subtraction starting at 100 (0/3) 1 2 0  Language: Repeat phrase (0/2) 1 1 2   Language : Fluency (0/1) 0 0 1  Abstraction (0/2) 2 2 2   Delayed Recall (0/5) 0 0 0  Orientation (0/6) 2 4 6   Total 14 15 20   Adjusted Score (based on education)  16    Physical Exam  General: The patient is alert and cooperative at the time of the examination.  She provides most all of her history. Anxious.   Skin: No significant peripheral edema is noted.  Neurologic Exam  Mental status: The patient is alert, fairly good historian . The patient has apparent normal recent and remote memory, with an apparently normal attention span and concentration ability.  Cranial  nerves: Facial symmetry is present. Speech is normal, no aphasia or dysarthria is noted. Extraocular movements are full. Visual fields are full. Head tremor, jaw tremor noted, mild.  Motor: The patient has good strength in all 4 extremities.   Sensory examination: Soft touch sensation is symmetric on the face, arms, and legs.  Coordination: The patient has good finger-nose-finger and heel-to-shin bilaterally.  Mild to  tremor with finger-nose-finger, greater on the left than the right. .   Gait and station: The patient has a normal gait.  Is independent  Reflexes: Deep tendon reflexes are symmetric.  DIAGNOSTIC DATA (LABS, IMAGING, TESTING) - I reviewed patient records, labs, notes, testing and imaging myself where available.  Lab Results  Component Value Date   WBC 6.7 06/01/2022   HGB 11.7 (L) 06/01/2022   HCT 35.9 (L) 06/01/2022   MCV 89.8 06/01/2022   PLT 240 06/01/2022      Component Value Date/Time   NA 140 06/01/2022 1130   NA 142 06/05/2020 1404   K 4.1 06/01/2022 1130   CL 109 06/01/2022 1130   CO2 23 06/01/2022 1130   GLUCOSE 106 (H) 06/01/2022 1130   BUN 19 06/01/2022 1130   BUN 29 (H) 06/05/2020 1404   CREATININE 1.02 (H) 06/01/2022 1130   CALCIUM  9.4 06/01/2022 1130   PROT 6.0 (L) 11/24/2021 0929   PROT 6.6 06/05/2020 1404   ALBUMIN 3.0 (L) 11/24/2021 0929   ALBUMIN 4.4 06/05/2020 1404   AST 19 11/24/2021 0929   ALT 12 11/24/2021 0929   ALKPHOS 91 11/24/2021 0929   BILITOT 0.4 11/24/2021 0929   BILITOT 0.2 06/05/2020 1404   GFRNONAA 55 (L) 06/01/2022 1130   GFRAA 79 12/29/2017 1045   Lab Results  Component Value Date   CHOL 147 04/25/2011   HDL 33 (L) 04/25/2011   LDLCALC 53 04/25/2011   TRIG 307 (H) 04/25/2011   CHOLHDL 4.5 04/25/2011   Lab Results  Component Value Date   HGBA1C 6.5 (H) 11/24/2021   Lab Results  Component Value Date   VITAMINB12 340 12/29/2017   Lab Results  Component Value Date   TSH 2.390 12/29/2017   ASSESSMENT AND  PLAN 83 y.o. year old female   1.  Essential tremor -Stable, primidone  very helpful, mild tremor on exam today, worsened with anxiety  -Primidone  level was 9.29 September 2022 -Continue primidone  50 mg, 2 tablets twice a day -Unable to tolerate beta-blocker due to side effect  2.  Dementia  - Slow worsening memory loss, anxiety contributes, got overwhelmed with memory testing, previously [facility] 14/30.  Lives alone, family close by, short distance driving - I have recommended against independent driving.  She was not pleased with  this.  I gave her family paperwork for driver's rehab evaluation - Continue Aricept  10 mg daily, Namenda  10 mg twice a day - Most consistent with central nervous system degenerative disorder, in the setting of family history of dementia, we will screen for Alzheimer's with ATN profile, APOE - Recommend exercise, activity, management of vascular risk factors, brain stimulating activity, healthy eating, drinking plenty of water  3.  Depression, anxiety -Under better control, on Wellbutrin , Zoloft , trazodone  Follow-up with me in 1 year or sooner if needed.  Lauraine Gayland MANDES, DNP  [facility] [address], [address] [place], [place] [address] [phone]

## 2023-10-19 NOTE — Patient Instructions (Signed)
 Check labs today to screen for Alzheimer's Disease  Recommend against driving  Continue current medications  Follow up in 1 year

## 2023-11-05 ENCOUNTER — Telehealth: Payer: Self-pay | Admitting: Neurology

## 2023-11-05 LAB — ATN PROFILE
A -- Beta-amyloid 42/40 Ratio: 0.095 — ABNORMAL LOW (ref >0.102)
Beta-amyloid 40: 287.4 pg/mL
Beta-amyloid 42: 27.24 pg/mL
N -- NfL, Plasma: 4.46 pg/mL (ref 0.00–9.13)
T -- p-tau181: 1.72 pg/mL — ABNORMAL HIGH (ref 0.00–0.97)

## 2023-11-05 LAB — APOE ALZHEIMER'S RISK

## 2023-11-05 NOTE — Telephone Encounter (Signed)
 I called the patient's daughter [name].  We reviewed results showed A+T+N-. E3/E3.  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  Discussed that I do not recommend she drive.  Likely will need increased supervision.  [name] is agreeable.

## 2023-11-17 ENCOUNTER — Emergency Department (HOSPITAL_COMMUNITY)
Admission: EM | Admit: 2023-11-17 | Discharge: 2023-11-17 | Disposition: A | Discharge status: Home / Self Care | Attending: Emergency Medicine | Admitting: Emergency Medicine

## 2023-11-17 ENCOUNTER — Encounter (HOSPITAL_COMMUNITY): Payer: Self-pay | Admitting: Radiology

## 2023-11-17 ENCOUNTER — Emergency Department (HOSPITAL_COMMUNITY)

## 2023-11-17 DIAGNOSIS — F0280 Dementia in other diseases classified elsewhere without behavioral disturbance: Secondary | ICD-10-CM | POA: Insufficient documentation

## 2023-11-17 DIAGNOSIS — R109 Unspecified abdominal pain: Secondary | ICD-10-CM | POA: Diagnosis not present

## 2023-11-17 DIAGNOSIS — N289 Disorder of kidney and ureter, unspecified: Secondary | ICD-10-CM | POA: Diagnosis not present

## 2023-11-17 DIAGNOSIS — N8110 Cystocele, unspecified: Secondary | ICD-10-CM | POA: Diagnosis not present

## 2023-11-17 DIAGNOSIS — G309 Alzheimer's disease, unspecified: Secondary | ICD-10-CM | POA: Diagnosis not present

## 2023-11-17 DIAGNOSIS — N3000 Acute cystitis without hematuria: Secondary | ICD-10-CM | POA: Insufficient documentation

## 2023-11-17 DIAGNOSIS — Z8572 Personal history of non-Hodgkin lymphomas: Secondary | ICD-10-CM | POA: Insufficient documentation

## 2023-11-17 DIAGNOSIS — K4090 Unilateral inguinal hernia, without obstruction or gangrene, not specified as recurrent: Secondary | ICD-10-CM | POA: Diagnosis not present

## 2023-11-17 DIAGNOSIS — I499 Cardiac arrhythmia, unspecified: Secondary | ICD-10-CM | POA: Diagnosis not present

## 2023-11-17 DIAGNOSIS — M549 Dorsalgia, unspecified: Secondary | ICD-10-CM | POA: Diagnosis not present

## 2023-11-17 DIAGNOSIS — D3502 Benign neoplasm of left adrenal gland: Secondary | ICD-10-CM | POA: Diagnosis not present

## 2023-11-17 LAB — COMPREHENSIVE METABOLIC PANEL WITH GFR
ALT: <5 U/L (ref 0–44)
AST: 19 U/L (ref 15–41)
Albumin: 3.8 g/dL (ref 3.5–5.0)
Alkaline Phosphatase: 61 U/L (ref 38–126)
Anion gap: 12 (ref 5–15)
BUN: 17 mg/dL (ref 8–23)
CO2: 20 mmol/L — ABNORMAL LOW (ref 22–32)
Calcium: 9 mg/dL (ref 8.9–10.3)
Chloride: 108 mmol/L (ref 98–111)
Creatinine, Ser: 0.9 mg/dL (ref 0.44–1.00)
GFR, Estimated: >60 mL/min (ref >60)
Glucose, Bld: 88 mg/dL (ref 70–99)
Potassium: 4.3 mmol/L (ref 3.5–5.1)
Sodium: 139 mmol/L (ref 135–145)
Total Bilirubin: 0.4 mg/dL (ref 0.0–1.2)
Total Protein: 6 g/dL — ABNORMAL LOW (ref 6.5–8.1)

## 2023-11-17 LAB — CBC WITH DIFFERENTIAL/PLATELET
Abs Immature Granulocytes: 0.04 K/uL (ref 0.00–0.07)
Basophils Absolute: 0.1 K/uL (ref 0.0–0.1)
Basophils Relative: 1 %
Eosinophils Absolute: 0.3 K/uL (ref 0.0–0.5)
Eosinophils Relative: 3 %
HCT: 34.6 % — ABNORMAL LOW (ref 36.0–46.0)
Hemoglobin: 10.7 g/dL — ABNORMAL LOW (ref 12.0–15.0)
Immature Granulocytes: 1 %
Lymphocytes Relative: 25 %
Lymphs Abs: 2 K/uL (ref 0.7–4.0)
MCH: 28.2 pg (ref 26.0–34.0)
MCHC: 30.9 g/dL (ref 30.0–36.0)
MCV: 91.1 fL (ref 80.0–100.0)
Monocytes Absolute: 0.6 K/uL (ref 0.1–1.0)
Monocytes Relative: 7 %
Neutro Abs: 5.2 K/uL (ref 1.7–7.7)
Neutrophils Relative %: 63 %
Platelets: 267 K/uL (ref 150–400)
RBC: 3.8 MIL/uL — ABNORMAL LOW (ref 3.87–5.11)
RDW: 12.6 % (ref 11.5–15.5)
WBC: 8.2 K/uL (ref 4.0–10.5)
nRBC: 0 % (ref 0.0–0.2)

## 2023-11-17 LAB — URINALYSIS, W/ REFLEX TO CULTURE (INFECTION SUSPECTED)
Bilirubin Urine: NEGATIVE
Glucose, UA: NEGATIVE mg/dL
Hgb urine dipstick: NEGATIVE
Ketones, ur: NEGATIVE mg/dL
Nitrite: POSITIVE — AB
Protein, ur: NEGATIVE mg/dL
Specific Gravity, Urine: 1.01 (ref 1.005–1.030)
WBC, UA: >50 WBC/hpf (ref 0–5)
pH: 6 (ref 5.0–8.0)

## 2023-11-17 MED ORDER — CEPHALEXIN 500 MG PO CAPS: 500.0000 mg | ORAL_CAPSULE | Freq: Four times a day (QID) | ORAL | 0 refills | Status: AC

## 2023-11-17 MED ORDER — HYDROMORPHONE HCL 1 MG/ML IJ SOLN
0.5000 mg | Freq: Once | INTRAMUSCULAR | Status: AC
  Administered 2023-11-17: 0.5 mg via INTRAVENOUS
  Filled 2023-11-17: qty 1

## 2023-11-17 MED ORDER — IOHEXOL 300 MG/ML  SOLN
100.0000 mL | Freq: Once | INTRAMUSCULAR | Status: AC | PRN
  Administered 2023-11-17: 100 mL via INTRAVENOUS

## 2023-11-17 MED ORDER — ONDANSETRON HCL 4 MG/2ML IJ SOLN
4.0000 mg | Freq: Once | INTRAMUSCULAR | Status: AC
  Administered 2023-11-17: 4 mg via INTRAVENOUS
  Filled 2023-11-17: qty 2

## 2023-11-17 MED ORDER — HYDROCODONE-ACETAMINOPHEN 5-325 MG PO TABS: 1.0000 | ORAL_TABLET | Freq: Four times a day (QID) | ORAL | 0 refills | Status: AC | PRN

## 2023-11-17 NOTE — ED Provider Notes (Signed)
 [place] EMERGENCY DEPARTMENT AT [facility] Provider Note   CSN: [id number] Arrival date & time: 11/17/23  9089     Patient presents with: Flank Pain   Rebecca Flores is a 83 y.o. female.    Flank Pain  Patient has left flank pain.  Began recently.  Worse with movements.  No dysuria.  However does have dementia and may not be an active historian.  No known falls precipitation.  Patient no fevers or chills.  Minimal relief with the fentanyl  by EMS.  Patient's daughter provides some of the history.  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     Prior to Admission medications   Medication Sig Start Date End Date Taking? Authorizing Provider  cephALEXin  (KEFLEX ) 500 MG capsule Take 1 capsule (500 mg total) by mouth 4 (four) times daily. 11/17/23  Yes Patsey Lot, MD  HYDROcodone -acetaminophen  (NORCO/VICODIN) 5-325 MG tablet Take 1 tablet by mouth every 6 (six) hours as needed. 11/17/23  Yes Patsey Lot, MD  alendronate (FOSAMAX) 70 MG tablet Take 70 mg by mouth once a week. Take with a full glass of water on an empty stomach. Patient not taking: Reported on 10/19/2023    [provider]  amLODipine -valsartan  (EXFORGE ) 10-160 MG tablet Take 1 tablet by mouth daily. 10/08/21   [provider]  azelastine (OPTIVAR) 0.05 % ophthalmic solution Place 1 drop into both eyes 2 (two) times daily as needed (for allergies). Patient not taking: Reported on 10/19/2023 10/31/21   [provider]  buPROPion  (WELLBUTRIN  XL) 150 MG 24 hr tablet Take 150 mg by mouth in the morning. Patient not taking: Reported on 10/19/2023 04/07/21   [provider]  Cholecalciferol  50 MCG (2000 UT) CAPS TAKE 1 CAPSULE BY MOUTH EVERY DAY FOR 90 DAYS; Duration: 90    [provider]  donepezil  (ARICEPT ) 5 MG tablet Take 1 tablet (5 mg total) by  mouth at bedtime. Patient taking differently: Take 10 mg by mouth at bedtime. 2 tablets 10/14/22   Gayland Lauraine PARAS, NP  famotidine (PEPCID) 20 MG tablet Take 20 mg by mouth daily as needed for heartburn. Patient not taking: Reported on 10/19/2023    [provider]  fenofibrate (TRICOR) 145 MG tablet Take 145 mg by mouth daily. 03/21/22   [provider]  loratadine  (CLARITIN ) 10 MG tablet Take 10 mg by mouth daily as needed for allergies (congestion).     [provider]  memantine  (NAMENDA ) 10 MG tablet TAKE 1 TABLET BY MOUTH TWICE A DAY 03/22/23   Gayland Lauraine PARAS, NP  primidone  (MYSOLINE ) 50 MG tablet Take 2 tablets (100 mg total) by mouth in the morning and at bedtime. Indication: essential tremors 10/19/23   Gayland Lauraine PARAS, NP  sertraline  (ZOLOFT ) 25 MG tablet Take 50 mg by mouth in the morning. 03/15/21   [provider]  traZODone (DESYREL) 50 MG tablet Take 50 mg by mouth at bedtime. Patient not taking: Reported on 10/19/2023 03/15/21   [provider]  TRESIBA FLEXTOUCH 100 UNIT/ML SOPN FlexTouch Pen Inject 33 Units into the skin daily. 08/06/17   [provider]    Allergies: Nsaids, Other, Atenolol, Codeine, Lyrica  [pregabalin ], Simvastatin, and Sulfa antibiotics    Review of Systems  Genitourinary:  Positive for flank pain.    Updated Vital Signs BP (!) 141/65 (BP Location: Right Arm)   Pulse  87   Temp 97.7 F (36.5 C) (Oral)   Resp 16   SpO2 98%   Physical Exam Abdominal:     Tenderness: There is no abdominal tenderness.  Musculoskeletal:     Comments: Tenderness over left lower posterior paraspinal area.  No rash.  Some pain with straight leg raise on the right although the pain is more on the left.  Skin:    General: Skin is warm.     Capillary Refill: Capillary refill takes less than 2 seconds.  Neurological:     Mental Status: She is alert. Mental status is at baseline.     (all labs ordered are listed, but only  abnormal results are displayed) Labs Reviewed  URINALYSIS, W/ REFLEX TO CULTURE (INFECTION SUSPECTED) - Abnormal; Notable for the following components:      Result Value   APPearance CLOUDY (*)    Nitrite POSITIVE (*)    Leukocytes,Ua LARGE (*)    Bacteria, UA MANY (*)    Non Squamous Epithelial 0-5 (*)    All other components within normal limits  COMPREHENSIVE METABOLIC PANEL WITH GFR - Abnormal; Notable for the following components:   CO2 20 (*)    Total Protein 6.0 (*)    All other components within normal limits  CBC WITH DIFFERENTIAL/PLATELET - Abnormal; Notable for the following components:   RBC 3.80 (*)    Hemoglobin 10.7 (*)    HCT 34.6 (*)    All other components within normal limits  URINE CULTURE    EKG: None  Radiology: CT ABDOMEN PELVIS W CONTRAST Result Date: 11/17/2023 CLINICAL DATA:  Lower back pain, flank pain. EXAM: CT ABDOMEN AND PELVIS WITH CONTRAST TECHNIQUE: Multidetector CT imaging of the abdomen and pelvis was performed using the standard protocol following bolus administration of intravenous contrast. RADIATION DOSE REDUCTION: This exam was performed according to the departmental dose-optimization program which includes automated exposure control, adjustment of the mA and/or kV according to patient size and/or use of iterative reconstruction technique. CONTRAST:  OMNIPAQUE  IOHEXOL  300 MG/ML  SOLN COMPARISON:  12/17/2012. FINDINGS: Lower chest: Dependent atelectasis bilaterally, left greater than right. Areas of rounded density in the peripheral left lower lobe may have internal fat and were present on 12/17/2012. Heart is enlarged. No pericardial or pleural effusion. Distal esophagus is grossly unremarkable. Hepatobiliary: Liver is decreased in attenuation diffusely. Cholecystectomy. No unexpected biliary ductal dilatation. Pancreas: Negative. Spleen: Small low-attenuation lesions in the spleen, difficult to characterize due to size and lack precontrast  imaging. Adrenals/Urinary Tract: Right adrenal gland is unremarkable. Left adrenal nodule measures 2.2 cm, present dating back to 12/17/2012, compatible with an adenoma. No specific follow-up necessary. Small low-attenuation lesions in the right kidney. No specific follow-up necessary. Scattered renal cortical scarring. Kidneys are otherwise unremarkable. Ureters are decompressed. Bladder is grossly unremarkable although there does appear to be a small cystocele. Stomach/Bowel: Small hiatal hernia. Stomach, small bowel and colon are unremarkable. Appendectomy per report. Vascular/Lymphatic: Atherosclerotic calcification of the aorta. No pathologically enlarged lymph nodes. Circumaortic left renal vein. Reproductive: Hysterectomy.  No adnexal mass. Other: No free fluid. Left inguinal hernia contains fat. Mesenteries and peritoneum are unremarkable. Musculoskeletal: Degenerative changes in the spine. IMPRESSION: 1. No acute findings. 2. Hepatic steatosis. 3. Left adrenal adenoma. 4. Small cystocele. 5.  Aortic atherosclerosis (ICD10-I70.0). Electronically Signed   By: Newell Eke M.D.   On: 11/17/2023 13:38     Procedures   Medications Ordered in the ED  HYDROmorphone  (DILAUDID ) injection 0.5 mg (  0.5 mg Intravenous Given 11/17/23 1050)  ondansetron  (ZOFRAN ) injection 4 mg (4 mg Intravenous Given 11/17/23 1050)  iohexol  (OMNIPAQUE ) 300 MG/ML solution 100 mL (100 mLs Intravenous Contrast Given 11/17/23 1211)  HYDROmorphone  (DILAUDID ) injection 0.5 mg (0.5 mg Intravenous Given 11/17/23 1325)                                    Medical Decision Making Amount and/or Complexity of Data Reviewed Labs: ordered. Radiology: ordered.  Risk Prescription drug management.   Patient with flank pain.  Differential diagnoses longed medical cause such as musculoskeletal pain, also UTI, kidney stone.  Per family ember also been more confused.  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  Will get blood work.  Will give pain medicine.  Reviewed pulmonary notes.  Shingles also considered but no rash yet.  Urinalysis shows UTI.  Patient feeling better.  Discussed with patient's daughter and they feel comfortable with her going home.  Think she would do better today compared in the hospital.  Will discharge.     Final diagnoses:  Flank pain  Acute cystitis without hematuria    ED Discharge Orders          Ordered    cephALEXin  (KEFLEX ) 500 MG capsule  4 times daily        11/17/23 1456    HYDROcodone -acetaminophen  (NORCO/VICODIN) 5-325 MG tablet  Every 6 hours PRN        11/17/23 1457               Patsey Lot, MD 11/17/23 1612

## 2023-11-17 NOTE — ED Triage Notes (Signed)
 Pt BIB EMS from home for left lower back pain. Patient a/ox3 at baseline with alzheimers/dementia. Denies any falls, patient is unsure if any changes to her urine. Given 25mcg of fentanyl  from EMS.

## 2023-11-19 LAB — URINE CULTURE: Culture: >=100000 — AB

## 2023-11-20 ENCOUNTER — Telehealth (HOSPITAL_BASED_OUTPATIENT_CLINIC_OR_DEPARTMENT_OTHER): Payer: Self-pay

## 2023-11-20 NOTE — Telephone Encounter (Signed)
 Post ED Visit - Positive Culture Follow-up  Culture report reviewed by antimicrobial stewardship pharmacist: [facility] [address], Pharm.D. []  Venetia Gully, Pharm.D., BCPS AQ-ID []  Garrel Crews, Pharm.D., BCPS []  Almarie Lunger, Pharm.D., BCPS []  Kenny [place], [place].D., BCPS, AAHIVP []  Rosaline Bihari, Pharm.D., BCPS, AAHIVP []  Vernell Meier, PharmD, BCPS []  Latanya Hint, PharmD, BCPS []  Donald Medley, PharmD, BCPS []  Rocky Bold, PharmD []  Dorothyann Alert, PharmD, BCPS []  Morene Babe, PharmD  [facility] []  Rosaline Edison, PharmD []  Romona Bliss, PharmD []  Dolphus Roller, PharmD []  Veva Seip, Rph []  Vernell Daunt) Leonce, PharmD []  Eva Allis, PharmD []  Rosaline Millet, PharmD []  Iantha Batch, PharmD []  Arvin Gauss, PharmD [x]  Wanda Hasting, PharmD []  Ronal Rav, PharmD []  Rocky Slade, PharmD []  Bard Jeans, PharmD   Positive urine culture Treated with Cephalexin , organism sensitive to the same and no further patient follow-up is required at this time.  Ruth Camelia Elbe 11/20/2023, 12:10 PM

## 2023-11-22 ENCOUNTER — Other Ambulatory Visit: Payer: Self-pay

## 2023-11-22 ENCOUNTER — Encounter (HOSPITAL_BASED_OUTPATIENT_CLINIC_OR_DEPARTMENT_OTHER): Payer: Self-pay | Admitting: Emergency Medicine

## 2023-11-22 ENCOUNTER — Emergency Department (HOSPITAL_BASED_OUTPATIENT_CLINIC_OR_DEPARTMENT_OTHER)

## 2023-11-22 ENCOUNTER — Emergency Department (HOSPITAL_BASED_OUTPATIENT_CLINIC_OR_DEPARTMENT_OTHER)
Admission: EM | Admit: 2023-11-22 | Discharge: 2023-11-22 | Disposition: A | Discharge status: Home / Self Care | Attending: Emergency Medicine | Admitting: Emergency Medicine

## 2023-11-22 DIAGNOSIS — S39012A Strain of muscle, fascia and tendon of lower back, initial encounter: Secondary | ICD-10-CM | POA: Diagnosis not present

## 2023-11-22 DIAGNOSIS — M549 Dorsalgia, unspecified: Secondary | ICD-10-CM | POA: Diagnosis not present

## 2023-11-22 DIAGNOSIS — F0280 Dementia in other diseases classified elsewhere without behavioral disturbance: Secondary | ICD-10-CM | POA: Diagnosis not present

## 2023-11-22 DIAGNOSIS — M8580 Other specified disorders of bone density and structure, unspecified site: Secondary | ICD-10-CM | POA: Diagnosis not present

## 2023-11-22 DIAGNOSIS — M47816 Spondylosis without myelopathy or radiculopathy, lumbar region: Secondary | ICD-10-CM | POA: Diagnosis not present

## 2023-11-22 DIAGNOSIS — X58XXXA Exposure to other specified factors, initial encounter: Secondary | ICD-10-CM | POA: Insufficient documentation

## 2023-11-22 DIAGNOSIS — G309 Alzheimer's disease, unspecified: Secondary | ICD-10-CM | POA: Insufficient documentation

## 2023-11-22 DIAGNOSIS — M48061 Spinal stenosis, lumbar region without neurogenic claudication: Secondary | ICD-10-CM | POA: Diagnosis not present

## 2023-11-22 DIAGNOSIS — S3992XA Unspecified injury of lower back, initial encounter: Secondary | ICD-10-CM | POA: Diagnosis present

## 2023-11-22 LAB — CBC WITH DIFFERENTIAL/PLATELET
Abs Immature Granulocytes: 0.04 K/uL (ref 0.00–0.07)
Basophils Absolute: 0.1 K/uL (ref 0.0–0.1)
Basophils Relative: 1 %
Eosinophils Absolute: 0.4 K/uL (ref 0.0–0.5)
Eosinophils Relative: 5 %
HCT: 34.1 % — ABNORMAL LOW (ref 36.0–46.0)
Hemoglobin: 11.4 g/dL — ABNORMAL LOW (ref 12.0–15.0)
Immature Granulocytes: 1 %
Lymphocytes Relative: 32 %
Lymphs Abs: 2.5 K/uL (ref 0.7–4.0)
MCH: 28.8 pg (ref 26.0–34.0)
MCHC: 33.4 g/dL (ref 30.0–36.0)
MCV: 86.1 fL (ref 80.0–100.0)
Monocytes Absolute: 0.5 K/uL (ref 0.1–1.0)
Monocytes Relative: 7 %
Neutro Abs: 4.3 K/uL (ref 1.7–7.7)
Neutrophils Relative %: 54 %
Platelets: 313 K/uL (ref 150–400)
RBC: 3.96 MIL/uL (ref 3.87–5.11)
RDW: 12.6 % (ref 11.5–15.5)
WBC: 7.8 K/uL (ref 4.0–10.5)
nRBC: 0 % (ref 0.0–0.2)

## 2023-11-22 LAB — BASIC METABOLIC PANEL WITH GFR
Anion gap: 11 (ref 5–15)
BUN: 15 mg/dL (ref 8–23)
CO2: 23 mmol/L (ref 22–32)
Calcium: 9.6 mg/dL (ref 8.9–10.3)
Chloride: 100 mmol/L (ref 98–111)
Creatinine, Ser: 0.95 mg/dL (ref 0.44–1.00)
GFR, Estimated: 59 mL/min — ABNORMAL LOW (ref >60)
Glucose, Bld: 121 mg/dL — ABNORMAL HIGH (ref 70–99)
Potassium: 4.3 mmol/L (ref 3.5–5.1)
Sodium: 134 mmol/L — ABNORMAL LOW (ref 135–145)

## 2023-11-22 LAB — URINALYSIS, W/ REFLEX TO CULTURE (INFECTION SUSPECTED)
Bilirubin Urine: NEGATIVE
Glucose, UA: NEGATIVE mg/dL
Hgb urine dipstick: NEGATIVE
Ketones, ur: NEGATIVE mg/dL
Nitrite: NEGATIVE
Protein, ur: NEGATIVE mg/dL
RBC / HPF: NONE SEEN RBC/hpf (ref 0–5)
Specific Gravity, Urine: 1.015 (ref 1.005–1.030)
pH: 6 (ref 5.0–8.0)

## 2023-11-22 MED ORDER — TRAMADOL HCL 50 MG PO TABS: 50.0000 mg | ORAL_TABLET | Freq: Four times a day (QID) | ORAL | 0 refills | Status: AC | PRN

## 2023-11-22 MED ORDER — MORPHINE SULFATE (PF) 2 MG/ML IV SOLN
2.0000 mg | Freq: Once | INTRAVENOUS | Status: AC
  Administered 2023-11-22: 2 mg via INTRAVENOUS
  Filled 2023-11-22: qty 1

## 2023-11-22 MED ORDER — LIDOCAINE 5 % EX PTCH: 1.0000 | MEDICATED_PATCH | CUTANEOUS | 0 refills | Status: AC

## 2023-11-22 MED ORDER — HYDROCODONE-ACETAMINOPHEN 5-325 MG PO TABS
2.0000 | ORAL_TABLET | Freq: Once | ORAL | Status: DC
  Filled 2023-11-22: qty 2

## 2023-11-22 MED ORDER — ONDANSETRON HCL 4 MG/2ML IJ SOLN
4.0000 mg | Freq: Once | INTRAMUSCULAR | Status: AC
  Administered 2023-11-22: 4 mg via INTRAVENOUS
  Filled 2023-11-22: qty 2

## 2023-11-22 MED ORDER — LIDOCAINE 5 % EX PTCH
1.0000 | MEDICATED_PATCH | CUTANEOUS | Status: DC
  Administered 2023-11-22: 1 via TRANSDERMAL
  Filled 2023-11-22: qty 1

## 2023-11-22 NOTE — Discharge Instructions (Addendum)
 On review of her medications, please do not give her Ultram along with her trazodone at night.  If she is to take the trazodone, do not give her Ultram along with this.  Otherwise continue to use Tylenol  as needed for pain.  Apply heat to the effected area to help with pain relief, follow-up with your primary care regarding continuing monitoring of her previous urinary infections this does seem to have resolved, continue to take antibiotics completely as prescribed.

## 2023-11-22 NOTE — ED Notes (Signed)
 Patient transported to X-ray

## 2023-11-22 NOTE — ED Provider Notes (Signed)
 [place] EMERGENCY DEPARTMENT AT [facility] Provider Note   CSN: [id number] Arrival date & time: 11/22/23  1026     Patient presents with: Back Pain   Rebecca Flores is a 83 y.o. female who presents to the ED today secondary to left lower back pain that has been present over the last week.  Previously treated for UTI beginning last Wednesday, 24 September, continuing to take cephalexin  for same.  Regarding her pain, family members have been giving her alternating acetaminophen  and ibuprofen with minimal effect.  She continues to have pain that exacerbated with rotation of the torso as well as the extension and flexion.  Denies have any dysuria.  Family ember's reports she has normal appetite, however has decreased activity secondary to pain in the lower back.  No reported recent falls, however patient does have known history of Alzheimer's dementia and it is unsure if she has had a recent fall that was unwitnessed.    Back Pain      Prior to Admission medications   Medication Sig Start Date End Date Taking? Authorizing Provider  lidocaine  (LIDODERM ) 5 % Place 1 patch onto the skin daily. Remove & Discard patch within 12 hours or as directed by MD 11/22/23  Yes Myriam Dorn BROCKS, PA  traMADol (ULTRAM) 50 MG tablet Take 1 tablet (50 mg total) by mouth every 6 (six) hours as needed. 11/22/23  Yes Myriam Dorn BROCKS, PA  alendronate (FOSAMAX) 70 MG tablet Take 70 mg by mouth once a week. Take with a full glass of water on an empty stomach. Patient not taking: Reported on 10/19/2023    [provider]  amLODipine -valsartan  (EXFORGE ) 10-160 MG tablet Take 1 tablet by mouth daily. 10/08/21   [provider]  azelastine (OPTIVAR) 0.05 % ophthalmic solution Place 1 drop into both eyes 2 (two) times daily as needed (for allergies). Patient not taking: Reported on 10/19/2023 10/31/21   [provider]  buPROPion  (WELLBUTRIN  XL) 150 MG 24 hr tablet Take  150 mg by mouth in the morning. Patient not taking: Reported on 10/19/2023 04/07/21   [provider]  cephALEXin  (KEFLEX ) 500 MG capsule Take 1 capsule (500 mg total) by mouth 4 (four) times daily. 11/17/23   Patsey Lot, MD  Cholecalciferol  50 MCG (2000 UT) CAPS TAKE 1 CAPSULE BY MOUTH EVERY DAY FOR 90 DAYS; Duration: 90    [provider]  donepezil  (ARICEPT ) 5 MG tablet Take 1 tablet (5 mg total) by mouth at bedtime. Patient taking differently: Take 10 mg by mouth at bedtime. 2 tablets 10/14/22   Gayland Lauraine PARAS, NP  famotidine (PEPCID) 20 MG tablet Take 20 mg by mouth daily as needed for heartburn. Patient not taking: Reported on 10/19/2023    [provider]  fenofibrate (TRICOR) 145 MG tablet Take 145 mg by mouth daily. 03/21/22   [provider]  HYDROcodone -acetaminophen  (NORCO/VICODIN) 5-325 MG tablet Take 1 tablet by mouth every 6 (six) hours as needed. 11/17/23   Patsey Lot, MD  loratadine  (CLARITIN ) 10 MG tablet Take 10 mg by mouth daily as needed for allergies (congestion).     [provider]  memantine  (NAMENDA ) 10 MG tablet TAKE 1 TABLET BY MOUTH TWICE A DAY 03/22/23   Gayland Lauraine PARAS, NP  primidone  (MYSOLINE ) 50 MG tablet Take 2 tablets (100 mg total) by mouth in the morning and at bedtime. Indication: essential tremors 10/19/23   Gayland Lauraine PARAS, NP  sertraline  (ZOLOFT ) 25 MG tablet  Take 50 mg by mouth in the morning. 03/15/21   [provider]  traZODone (DESYREL) 50 MG tablet Take 50 mg by mouth at bedtime. Patient not taking: Reported on 10/19/2023 03/15/21   [provider]  TRESIBA FLEXTOUCH 100 UNIT/ML SOPN FlexTouch Pen Inject 33 Units into the skin daily. 08/06/17   [provider]    Allergies: Nsaids, Other, Atenolol, Codeine, Lyrica  [pregabalin ], Simvastatin, and Sulfa antibiotics    Review of Systems  Musculoskeletal:  Positive for back pain.  All other systems reviewed and are  negative.   Updated Vital Signs BP (!) 167/61   Pulse (!) 55   Temp (!) 97.1 F (36.2 C)   Resp 16   SpO2 98%   Physical Exam Musculoskeletal:     Cervical back: Normal.     Thoracic back: Normal.     Lumbar back: Tenderness present. Positive left straight leg raise test. Negative right straight leg raise test.     (all labs ordered are listed, but only abnormal results are displayed) Labs Reviewed  BASIC METABOLIC PANEL WITH GFR - Abnormal; Notable for the following components:      Result Value   Sodium 134 (*)    Glucose, Bld 121 (*)    GFR, Estimated 59 (*)    All other components within normal limits  CBC WITH DIFFERENTIAL/PLATELET - Abnormal; Notable for the following components:   Hemoglobin 11.4 (*)    HCT 34.1 (*)    All other components within normal limits  URINALYSIS, W/ REFLEX TO CULTURE (INFECTION SUSPECTED) - Abnormal; Notable for the following components:   Leukocytes,Ua TRACE (*)    Bacteria, UA RARE (*)    All other components within normal limits    EKG: None  Radiology: DG Lumbar Spine Complete Result Date: 11/22/2023 CLINICAL DATA:  Back pain EXAM: LUMBAR SPINE - COMPLETE 4+ VIEW COMPARISON:  Lumbar spine x-ray 05/04/2011. FINDINGS: The bones are diffusely osteopenic. There is no evidence for fracture. There is mild disc space narrowing at L4-L5 and L5-S1 which has slightly progressed. Degenerative facet changes are seen at these levels as well. Disc spaces are otherwise well maintained. Alignment is anatomic. There are atherosclerotic calcifications of the aorta and iliac arteries. IMPRESSION: Mild degenerative changes at L4-L5 and L5-S1, slightly progressed. Electronically Signed   By: Greig Pique M.D.   On: 11/22/2023 15:13     Procedures   Medications Ordered in the ED  lidocaine  (LIDODERM ) 5 % 1 patch (has no administration in time range)  morphine  (PF) 2 MG/ML injection 2 mg (2 mg Intravenous Given 11/22/23 1343)  ondansetron  (ZOFRAN )  injection 4 mg (4 mg Intravenous Given 11/22/23 1343)  morphine  (PF) 2 MG/ML injection 2 mg (2 mg Intravenous Given 11/22/23 1525)                                    Medical Decision Making Amount and/or Complexity of Data Reviewed Labs: ordered. Radiology: ordered.  Risk Prescription drug management.   Medical Decision Making:   Yudith Norlander is a 83 y.o. female who presented to the ED today with left lower back pain detailed above.    Additional history discussed with patient's family/caregivers.  Patient's presentation is complicated by their history of Alzheimer's dementia.  Complete initial physical exam performed, notably the patient  was alert and oriented in no apparent distress.  She is tender to the left lower  portion of her back, does have a positive straight leg test on the left..    Reviewed and confirmed nursing documentation for past medical history, family history, social history.    Initial Assessment:   With the patient's presentation of left lower back pain, differential diagnosis includes lumbar strain, possible fracture to the lumbar spine, with recent UTI consider possible pyelonephritis.   Initial Plan:  Provide IV morphine  and ondansetron  to manage pain as well as nausea associated with analgesia. Screening labs including CBC and Metabolic panel to evaluate for infectious or metabolic etiology of disease.  Urinalysis with reflex culture ordered to evaluate for UTI or relevant urologic/nephrologic pathology.  Obtain plain films of the lumbar spine to evaluate for acute fracture. Objective evaluation as below reviewed   Initial Study Results:   Laboratory  All laboratory results reviewed without evidence of clinically relevant pathology.   Exceptions include: Some leukocytes present in the urine however this is greatly improved from previous UA.   Radiology:  All images reviewed independently. Agree with radiology report at this time.   DG Lumbar  Spine Complete Result Date: 11/22/2023 CLINICAL DATA:  Back pain EXAM: LUMBAR SPINE - COMPLETE 4+ VIEW COMPARISON:  Lumbar spine x-ray 05/04/2011. FINDINGS: The bones are diffusely osteopenic. There is no evidence for fracture. There is mild disc space narrowing at L4-L5 and L5-S1 which has slightly progressed. Degenerative facet changes are seen at these levels as well. Disc spaces are otherwise well maintained. Alignment is anatomic. There are atherosclerotic calcifications of the aorta and iliac arteries. IMPRESSION: Mild degenerative changes at L4-L5 and L5-S1, slightly progressed. Electronically Signed   By: Greig Pique M.D.   On: 11/22/2023 15:13   CT ABDOMEN PELVIS W CONTRAST Result Date: 11/17/2023 CLINICAL DATA:  Lower back pain, flank pain. EXAM: CT ABDOMEN AND PELVIS WITH CONTRAST TECHNIQUE: Multidetector CT imaging of the abdomen and pelvis was performed using the standard protocol following bolus administration of intravenous contrast. RADIATION DOSE REDUCTION: This exam was performed according to the departmental dose-optimization program which includes automated exposure control, adjustment of the mA and/or kV according to patient size and/or use of iterative reconstruction technique. CONTRAST:  OMNIPAQUE  IOHEXOL  300 MG/ML  SOLN COMPARISON:  12/17/2012. FINDINGS: Lower chest: Dependent atelectasis bilaterally, left greater than right. Areas of rounded density in the peripheral left lower lobe may have internal fat and were present on 12/17/2012. Heart is enlarged. No pericardial or pleural effusion. Distal esophagus is grossly unremarkable. Hepatobiliary: Liver is decreased in attenuation diffusely. Cholecystectomy. No unexpected biliary ductal dilatation. Pancreas: Negative. Spleen: Small low-attenuation lesions in the spleen, difficult to characterize due to size and lack precontrast imaging. Adrenals/Urinary Tract: Right adrenal gland is unremarkable. Left adrenal nodule measures 2.2  cm, present dating back to 12/17/2012, compatible with an adenoma. No specific follow-up necessary. Small low-attenuation lesions in the right kidney. No specific follow-up necessary. Scattered renal cortical scarring. Kidneys are otherwise unremarkable. Ureters are decompressed. Bladder is grossly unremarkable although there does appear to be a small cystocele. Stomach/Bowel: Small hiatal hernia. Stomach, small bowel and colon are unremarkable. Appendectomy per report. Vascular/Lymphatic: Atherosclerotic calcification of the aorta. No pathologically enlarged lymph nodes. Circumaortic left renal vein. Reproductive: Hysterectomy.  No adnexal mass. Other: No free fluid. Left inguinal hernia contains fat. Mesenteries and peritoneum are unremarkable. Musculoskeletal: Degenerative changes in the spine. IMPRESSION: 1. No acute findings. 2. Hepatic steatosis. 3. Left adrenal adenoma. 4. Small cystocele. 5.  Aortic atherosclerosis (ICD10-I70.0). Electronically Signed   By: Newell  Blietz M.D.   On: 11/17/2023 13:38     Reassessment and Plan:   Her UA is greatly improved from previous findings from the previous visit, and BMP does not show any AKI at this time.  Further, remainder of her workup is normal, vital signs have remained stable during her time here.  Given that she is exquisitely tender to palpation of the affected area, and pain is exacerbated with rotational movement as well as with flexion and extension of the lumbar spine, find this is most likely secondary to a strain of the muscles of the left side of the lumbar spine, imaging is negative for acute fracture.  Will manage her pain with outpatient prescription of Ultram as well as giving her prescription for Lidoderm  patches and have applied the same prior to her discharge.  Encouraged to follow-up primary care for further evaluation of her resolving UTI as well as for her back pain.  Family understands and agrees has no further concerns at this time.        Final diagnoses:  Strain of lumbar region, initial encounter    ED Discharge Orders          Ordered    traMADol (ULTRAM) 50 MG tablet  Every 6 hours PRN        11/22/23 1729    lidocaine  (LIDODERM ) 5 %  Every 24 hours        11/22/23 1729               Myriam Dorn BROCKS, [place] 11/22/23 1933    Geraldene Hamilton, MD 11/23/23 2226

## 2023-11-22 NOTE — ED Triage Notes (Addendum)
 Recurrent and persistent lower back , recent UTI and antibiotic 5 days in .  Hx Alzheimer and per daughter pt has been more confused than usual . .  Laying on her left side relieves some what the pain per daughter

## 2023-11-23 DIAGNOSIS — M549 Dorsalgia, unspecified: Secondary | ICD-10-CM | POA: Diagnosis not present

## 2023-11-23 DIAGNOSIS — R54 Age-related physical debility: Secondary | ICD-10-CM | POA: Diagnosis not present

## 2023-11-25 DIAGNOSIS — E559 Vitamin D deficiency, unspecified: Secondary | ICD-10-CM | POA: Diagnosis not present

## 2023-11-25 DIAGNOSIS — S39012D Strain of muscle, fascia and tendon of lower back, subsequent encounter: Secondary | ICD-10-CM | POA: Diagnosis not present

## 2023-11-25 DIAGNOSIS — J449 Chronic obstructive pulmonary disease, unspecified: Secondary | ICD-10-CM | POA: Diagnosis not present

## 2023-11-25 DIAGNOSIS — Z7983 Long term (current) use of bisphosphonates: Secondary | ICD-10-CM | POA: Diagnosis not present

## 2023-11-25 DIAGNOSIS — K219 Gastro-esophageal reflux disease without esophagitis: Secondary | ICD-10-CM | POA: Diagnosis not present

## 2023-11-25 DIAGNOSIS — I129 Hypertensive chronic kidney disease with stage 1 through stage 4 chronic kidney disease, or unspecified chronic kidney disease: Secondary | ICD-10-CM | POA: Diagnosis not present

## 2023-11-25 DIAGNOSIS — Z9049 Acquired absence of other specified parts of digestive tract: Secondary | ICD-10-CM | POA: Diagnosis not present

## 2023-11-25 DIAGNOSIS — C8208 Follicular lymphoma grade I, lymph nodes of multiple sites: Secondary | ICD-10-CM | POA: Diagnosis not present

## 2023-11-25 DIAGNOSIS — N1831 Chronic kidney disease, stage 3a: Secondary | ICD-10-CM | POA: Diagnosis not present

## 2023-11-25 DIAGNOSIS — H9190 Unspecified hearing loss, unspecified ear: Secondary | ICD-10-CM | POA: Diagnosis not present

## 2023-11-25 DIAGNOSIS — M810 Age-related osteoporosis without current pathological fracture: Secondary | ICD-10-CM | POA: Diagnosis not present

## 2023-11-25 DIAGNOSIS — Z556 Problems related to health literacy: Secondary | ICD-10-CM | POA: Diagnosis not present

## 2023-11-25 DIAGNOSIS — D631 Anemia in chronic kidney disease: Secondary | ICD-10-CM | POA: Diagnosis not present

## 2023-11-25 DIAGNOSIS — E782 Mixed hyperlipidemia: Secondary | ICD-10-CM | POA: Diagnosis not present

## 2023-11-25 DIAGNOSIS — E1122 Type 2 diabetes mellitus with diabetic chronic kidney disease: Secondary | ICD-10-CM | POA: Diagnosis not present

## 2023-11-25 DIAGNOSIS — F5101 Primary insomnia: Secondary | ICD-10-CM | POA: Diagnosis not present

## 2023-11-25 DIAGNOSIS — E1142 Type 2 diabetes mellitus with diabetic polyneuropathy: Secondary | ICD-10-CM | POA: Diagnosis not present

## 2023-11-25 DIAGNOSIS — N390 Urinary tract infection, site not specified: Secondary | ICD-10-CM | POA: Diagnosis not present

## 2023-11-25 DIAGNOSIS — D630 Anemia in neoplastic disease: Secondary | ICD-10-CM | POA: Diagnosis not present

## 2023-11-25 DIAGNOSIS — Z794 Long term (current) use of insulin: Secondary | ICD-10-CM | POA: Diagnosis not present

## 2023-11-30 DIAGNOSIS — N1831 Chronic kidney disease, stage 3a: Secondary | ICD-10-CM | POA: Diagnosis not present

## 2023-11-30 DIAGNOSIS — Z794 Long term (current) use of insulin: Secondary | ICD-10-CM | POA: Diagnosis not present

## 2023-11-30 DIAGNOSIS — S39012D Strain of muscle, fascia and tendon of lower back, subsequent encounter: Secondary | ICD-10-CM | POA: Diagnosis not present

## 2023-11-30 DIAGNOSIS — I129 Hypertensive chronic kidney disease with stage 1 through stage 4 chronic kidney disease, or unspecified chronic kidney disease: Secondary | ICD-10-CM | POA: Diagnosis not present

## 2023-11-30 DIAGNOSIS — E1122 Type 2 diabetes mellitus with diabetic chronic kidney disease: Secondary | ICD-10-CM | POA: Diagnosis not present

## 2023-11-30 DIAGNOSIS — J449 Chronic obstructive pulmonary disease, unspecified: Secondary | ICD-10-CM | POA: Diagnosis not present

## 2023-11-30 DIAGNOSIS — E782 Mixed hyperlipidemia: Secondary | ICD-10-CM | POA: Diagnosis not present

## 2023-11-30 DIAGNOSIS — E1142 Type 2 diabetes mellitus with diabetic polyneuropathy: Secondary | ICD-10-CM | POA: Diagnosis not present

## 2023-11-30 DIAGNOSIS — Z7983 Long term (current) use of bisphosphonates: Secondary | ICD-10-CM | POA: Diagnosis not present

## 2023-11-30 DIAGNOSIS — F5101 Primary insomnia: Secondary | ICD-10-CM | POA: Diagnosis not present

## 2023-11-30 DIAGNOSIS — N390 Urinary tract infection, site not specified: Secondary | ICD-10-CM | POA: Diagnosis not present

## 2023-11-30 DIAGNOSIS — D631 Anemia in chronic kidney disease: Secondary | ICD-10-CM | POA: Diagnosis not present

## 2023-11-30 DIAGNOSIS — H9190 Unspecified hearing loss, unspecified ear: Secondary | ICD-10-CM | POA: Diagnosis not present

## 2023-11-30 DIAGNOSIS — C8208 Follicular lymphoma grade I, lymph nodes of multiple sites: Secondary | ICD-10-CM | POA: Diagnosis not present

## 2023-11-30 DIAGNOSIS — E559 Vitamin D deficiency, unspecified: Secondary | ICD-10-CM | POA: Diagnosis not present

## 2023-11-30 DIAGNOSIS — Z556 Problems related to health literacy: Secondary | ICD-10-CM | POA: Diagnosis not present

## 2023-11-30 DIAGNOSIS — M810 Age-related osteoporosis without current pathological fracture: Secondary | ICD-10-CM | POA: Diagnosis not present

## 2023-11-30 DIAGNOSIS — D630 Anemia in neoplastic disease: Secondary | ICD-10-CM | POA: Diagnosis not present

## 2023-11-30 DIAGNOSIS — Z9049 Acquired absence of other specified parts of digestive tract: Secondary | ICD-10-CM | POA: Diagnosis not present

## 2023-11-30 DIAGNOSIS — K219 Gastro-esophageal reflux disease without esophagitis: Secondary | ICD-10-CM | POA: Diagnosis not present

## 2023-12-09 DIAGNOSIS — E782 Mixed hyperlipidemia: Secondary | ICD-10-CM | POA: Diagnosis not present

## 2023-12-09 DIAGNOSIS — D631 Anemia in chronic kidney disease: Secondary | ICD-10-CM | POA: Diagnosis not present

## 2023-12-09 DIAGNOSIS — N1831 Chronic kidney disease, stage 3a: Secondary | ICD-10-CM | POA: Diagnosis not present

## 2023-12-09 DIAGNOSIS — N390 Urinary tract infection, site not specified: Secondary | ICD-10-CM | POA: Diagnosis not present

## 2023-12-09 DIAGNOSIS — Z9049 Acquired absence of other specified parts of digestive tract: Secondary | ICD-10-CM | POA: Diagnosis not present

## 2023-12-09 DIAGNOSIS — E1122 Type 2 diabetes mellitus with diabetic chronic kidney disease: Secondary | ICD-10-CM | POA: Diagnosis not present

## 2023-12-09 DIAGNOSIS — E559 Vitamin D deficiency, unspecified: Secondary | ICD-10-CM | POA: Diagnosis not present

## 2023-12-09 DIAGNOSIS — M810 Age-related osteoporosis without current pathological fracture: Secondary | ICD-10-CM | POA: Diagnosis not present

## 2023-12-09 DIAGNOSIS — I129 Hypertensive chronic kidney disease with stage 1 through stage 4 chronic kidney disease, or unspecified chronic kidney disease: Secondary | ICD-10-CM | POA: Diagnosis not present

## 2023-12-09 DIAGNOSIS — Z7983 Long term (current) use of bisphosphonates: Secondary | ICD-10-CM | POA: Diagnosis not present

## 2023-12-09 DIAGNOSIS — J449 Chronic obstructive pulmonary disease, unspecified: Secondary | ICD-10-CM | POA: Diagnosis not present

## 2023-12-09 DIAGNOSIS — S39012D Strain of muscle, fascia and tendon of lower back, subsequent encounter: Secondary | ICD-10-CM | POA: Diagnosis not present

## 2023-12-09 DIAGNOSIS — E1142 Type 2 diabetes mellitus with diabetic polyneuropathy: Secondary | ICD-10-CM | POA: Diagnosis not present

## 2023-12-09 DIAGNOSIS — D630 Anemia in neoplastic disease: Secondary | ICD-10-CM | POA: Diagnosis not present

## 2023-12-09 DIAGNOSIS — H9190 Unspecified hearing loss, unspecified ear: Secondary | ICD-10-CM | POA: Diagnosis not present

## 2023-12-09 DIAGNOSIS — K219 Gastro-esophageal reflux disease without esophagitis: Secondary | ICD-10-CM | POA: Diagnosis not present

## 2023-12-09 DIAGNOSIS — C8208 Follicular lymphoma grade I, lymph nodes of multiple sites: Secondary | ICD-10-CM | POA: Diagnosis not present

## 2023-12-09 DIAGNOSIS — F5101 Primary insomnia: Secondary | ICD-10-CM | POA: Diagnosis not present

## 2023-12-09 DIAGNOSIS — Z794 Long term (current) use of insulin: Secondary | ICD-10-CM | POA: Diagnosis not present

## 2023-12-09 DIAGNOSIS — Z556 Problems related to health literacy: Secondary | ICD-10-CM | POA: Diagnosis not present

## 2023-12-15 DIAGNOSIS — Z9049 Acquired absence of other specified parts of digestive tract: Secondary | ICD-10-CM | POA: Diagnosis not present

## 2023-12-15 DIAGNOSIS — D630 Anemia in neoplastic disease: Secondary | ICD-10-CM | POA: Diagnosis not present

## 2023-12-15 DIAGNOSIS — N1831 Chronic kidney disease, stage 3a: Secondary | ICD-10-CM | POA: Diagnosis not present

## 2023-12-15 DIAGNOSIS — D631 Anemia in chronic kidney disease: Secondary | ICD-10-CM | POA: Diagnosis not present

## 2023-12-15 DIAGNOSIS — E559 Vitamin D deficiency, unspecified: Secondary | ICD-10-CM | POA: Diagnosis not present

## 2023-12-15 DIAGNOSIS — C8208 Follicular lymphoma grade I, lymph nodes of multiple sites: Secondary | ICD-10-CM | POA: Diagnosis not present

## 2023-12-15 DIAGNOSIS — S39012D Strain of muscle, fascia and tendon of lower back, subsequent encounter: Secondary | ICD-10-CM | POA: Diagnosis not present

## 2023-12-15 DIAGNOSIS — J449 Chronic obstructive pulmonary disease, unspecified: Secondary | ICD-10-CM | POA: Diagnosis not present

## 2023-12-15 DIAGNOSIS — E1122 Type 2 diabetes mellitus with diabetic chronic kidney disease: Secondary | ICD-10-CM | POA: Diagnosis not present

## 2023-12-15 DIAGNOSIS — E1142 Type 2 diabetes mellitus with diabetic polyneuropathy: Secondary | ICD-10-CM | POA: Diagnosis not present

## 2023-12-15 DIAGNOSIS — Z7983 Long term (current) use of bisphosphonates: Secondary | ICD-10-CM | POA: Diagnosis not present

## 2023-12-15 DIAGNOSIS — Z556 Problems related to health literacy: Secondary | ICD-10-CM | POA: Diagnosis not present

## 2023-12-15 DIAGNOSIS — I129 Hypertensive chronic kidney disease with stage 1 through stage 4 chronic kidney disease, or unspecified chronic kidney disease: Secondary | ICD-10-CM | POA: Diagnosis not present

## 2023-12-15 DIAGNOSIS — H9190 Unspecified hearing loss, unspecified ear: Secondary | ICD-10-CM | POA: Diagnosis not present

## 2023-12-15 DIAGNOSIS — F5101 Primary insomnia: Secondary | ICD-10-CM | POA: Diagnosis not present

## 2023-12-15 DIAGNOSIS — E782 Mixed hyperlipidemia: Secondary | ICD-10-CM | POA: Diagnosis not present

## 2023-12-15 DIAGNOSIS — Z794 Long term (current) use of insulin: Secondary | ICD-10-CM | POA: Diagnosis not present

## 2023-12-15 DIAGNOSIS — K219 Gastro-esophageal reflux disease without esophagitis: Secondary | ICD-10-CM | POA: Diagnosis not present

## 2023-12-15 DIAGNOSIS — M810 Age-related osteoporosis without current pathological fracture: Secondary | ICD-10-CM | POA: Diagnosis not present

## 2023-12-15 DIAGNOSIS — N390 Urinary tract infection, site not specified: Secondary | ICD-10-CM | POA: Diagnosis not present

## 2023-12-16 ENCOUNTER — Encounter: Payer: Self-pay | Admitting: Neurology

## 2023-12-21 DIAGNOSIS — E1142 Type 2 diabetes mellitus with diabetic polyneuropathy: Secondary | ICD-10-CM | POA: Diagnosis not present

## 2023-12-21 DIAGNOSIS — N390 Urinary tract infection, site not specified: Secondary | ICD-10-CM | POA: Diagnosis not present

## 2023-12-21 DIAGNOSIS — E559 Vitamin D deficiency, unspecified: Secondary | ICD-10-CM | POA: Diagnosis not present

## 2023-12-21 DIAGNOSIS — M810 Age-related osteoporosis without current pathological fracture: Secondary | ICD-10-CM | POA: Diagnosis not present

## 2023-12-21 DIAGNOSIS — E782 Mixed hyperlipidemia: Secondary | ICD-10-CM | POA: Diagnosis not present

## 2023-12-21 DIAGNOSIS — E1122 Type 2 diabetes mellitus with diabetic chronic kidney disease: Secondary | ICD-10-CM | POA: Diagnosis not present

## 2023-12-21 DIAGNOSIS — Z794 Long term (current) use of insulin: Secondary | ICD-10-CM | POA: Diagnosis not present

## 2023-12-21 DIAGNOSIS — K219 Gastro-esophageal reflux disease without esophagitis: Secondary | ICD-10-CM | POA: Diagnosis not present

## 2023-12-21 DIAGNOSIS — Z7983 Long term (current) use of bisphosphonates: Secondary | ICD-10-CM | POA: Diagnosis not present

## 2023-12-21 DIAGNOSIS — J449 Chronic obstructive pulmonary disease, unspecified: Secondary | ICD-10-CM | POA: Diagnosis not present

## 2023-12-21 DIAGNOSIS — D630 Anemia in neoplastic disease: Secondary | ICD-10-CM | POA: Diagnosis not present

## 2023-12-21 DIAGNOSIS — C8208 Follicular lymphoma grade I, lymph nodes of multiple sites: Secondary | ICD-10-CM | POA: Diagnosis not present

## 2023-12-21 DIAGNOSIS — Z9049 Acquired absence of other specified parts of digestive tract: Secondary | ICD-10-CM | POA: Diagnosis not present

## 2023-12-21 DIAGNOSIS — D631 Anemia in chronic kidney disease: Secondary | ICD-10-CM | POA: Diagnosis not present

## 2023-12-21 DIAGNOSIS — Z556 Problems related to health literacy: Secondary | ICD-10-CM | POA: Diagnosis not present

## 2023-12-21 DIAGNOSIS — I129 Hypertensive chronic kidney disease with stage 1 through stage 4 chronic kidney disease, or unspecified chronic kidney disease: Secondary | ICD-10-CM | POA: Diagnosis not present

## 2023-12-21 DIAGNOSIS — F5101 Primary insomnia: Secondary | ICD-10-CM | POA: Diagnosis not present

## 2023-12-21 DIAGNOSIS — H9190 Unspecified hearing loss, unspecified ear: Secondary | ICD-10-CM | POA: Diagnosis not present

## 2023-12-21 DIAGNOSIS — N1831 Chronic kidney disease, stage 3a: Secondary | ICD-10-CM | POA: Diagnosis not present

## 2023-12-21 DIAGNOSIS — S39012D Strain of muscle, fascia and tendon of lower back, subsequent encounter: Secondary | ICD-10-CM | POA: Diagnosis not present

## 2024-01-25 ENCOUNTER — Encounter: Payer: Self-pay | Admitting: Neurology

## 2024-01-26 NOTE — Telephone Encounter (Signed)
 Merged with pre-existing encounter

## 2024-01-27 NOTE — Telephone Encounter (Signed)
 Letter placed at front desk for pick up

## 2024-10-17 ENCOUNTER — Ambulatory Visit: Admitting: Neurology
